# Patient Record
Sex: Male | Born: 1950 | Race: White | Hispanic: No | Marital: Married | Smoking: Never smoker
Health system: Southern US, Community
[De-identification: ages and names within clinical notes are randomized; demographics above are authoritative.]

## PROBLEM LIST (undated history)

## (undated) DIAGNOSIS — R519 Headache, unspecified: Secondary | ICD-10-CM

## (undated) DIAGNOSIS — C801 Malignant (primary) neoplasm, unspecified: Secondary | ICD-10-CM

## (undated) DIAGNOSIS — C61 Malignant neoplasm of prostate: Secondary | ICD-10-CM

## (undated) DIAGNOSIS — E119 Type 2 diabetes mellitus without complications: Secondary | ICD-10-CM

## (undated) DIAGNOSIS — G4730 Sleep apnea, unspecified: Secondary | ICD-10-CM

## (undated) DIAGNOSIS — C779 Secondary and unspecified malignant neoplasm of lymph node, unspecified: Secondary | ICD-10-CM

## (undated) DIAGNOSIS — H409 Unspecified glaucoma: Secondary | ICD-10-CM

## (undated) DIAGNOSIS — Z8546 Personal history of malignant neoplasm of prostate: Secondary | ICD-10-CM

## (undated) DIAGNOSIS — I10 Essential (primary) hypertension: Secondary | ICD-10-CM

## (undated) DIAGNOSIS — K219 Gastro-esophageal reflux disease without esophagitis: Secondary | ICD-10-CM

## (undated) DIAGNOSIS — C439 Malignant melanoma of skin, unspecified: Secondary | ICD-10-CM

## (undated) DIAGNOSIS — T8130XA Disruption of wound, unspecified, initial encounter: Secondary | ICD-10-CM

## (undated) HISTORY — DX: Unspecified glaucoma: H40.9

## (undated) HISTORY — PX: EYE SURGERY: SHX253

## (undated) HISTORY — DX: Personal history of malignant neoplasm of prostate: Z85.46

## (undated) HISTORY — PX: OTHER SURGICAL HISTORY: SHX169

## (undated) HISTORY — DX: Disruption of wound, unspecified, initial encounter: T81.30XA

## (undated) HISTORY — DX: Malignant neoplasm of prostate: C61

## (undated) HISTORY — DX: Secondary and unspecified malignant neoplasm of lymph node, unspecified: C77.9

---

## 1995-10-14 HISTORY — PX: BACK SURGERY: SHX140

## 1998-09-20 ENCOUNTER — Encounter: Payer: Self-pay | Admitting: Neurosurgery

## 1998-09-22 ENCOUNTER — Encounter: Payer: Self-pay | Admitting: Neurosurgery

## 1998-09-22 ENCOUNTER — Observation Stay (HOSPITAL_COMMUNITY): Admission: RE | Admit: 1998-09-22 | Discharge: 1998-09-23 | Payer: Self-pay | Admitting: Neurosurgery

## 2001-02-01 ENCOUNTER — Ambulatory Visit (HOSPITAL_COMMUNITY): Admission: RE | Admit: 2001-02-01 | Discharge: 2001-02-01 | Payer: Self-pay | Admitting: *Deleted

## 2003-01-23 ENCOUNTER — Ambulatory Visit (HOSPITAL_COMMUNITY): Admission: RE | Admit: 2003-01-23 | Discharge: 2003-01-23 | Payer: Self-pay | Admitting: Ophthalmology

## 2003-01-30 ENCOUNTER — Ambulatory Visit (HOSPITAL_COMMUNITY): Admission: RE | Admit: 2003-01-30 | Discharge: 2003-01-30 | Payer: Self-pay | Admitting: Ophthalmology

## 2010-10-13 HISTORY — PX: OTHER SURGICAL HISTORY: SHX169

## 2011-01-27 LAB — HM COLONOSCOPY

## 2012-03-11 ENCOUNTER — Other Ambulatory Visit: Payer: Self-pay | Admitting: Urology

## 2012-05-24 ENCOUNTER — Encounter (HOSPITAL_COMMUNITY): Payer: Self-pay | Admitting: Pharmacy Technician

## 2012-06-03 ENCOUNTER — Encounter (HOSPITAL_COMMUNITY)
Admission: RE | Admit: 2012-06-03 | Discharge: 2012-06-03 | Disposition: A | Discharge status: Home / Self Care | Payer: BC Managed Care – PPO | Source: Ambulatory Visit | Attending: Urology | Admitting: Urology

## 2012-06-03 ENCOUNTER — Encounter (HOSPITAL_COMMUNITY): Payer: Self-pay

## 2012-06-03 ENCOUNTER — Ambulatory Visit (HOSPITAL_COMMUNITY)
Admission: RE | Admit: 2012-06-03 | Discharge: 2012-06-03 | Disposition: A | Discharge status: Home / Self Care | Payer: BC Managed Care – PPO | Source: Ambulatory Visit | Attending: Urology | Admitting: Urology

## 2012-06-03 DIAGNOSIS — Z01818 Encounter for other preprocedural examination: Secondary | ICD-10-CM | POA: Insufficient documentation

## 2012-06-03 DIAGNOSIS — Z01812 Encounter for preprocedural laboratory examination: Secondary | ICD-10-CM | POA: Insufficient documentation

## 2012-06-03 HISTORY — DX: Malignant (primary) neoplasm, unspecified: C80.1

## 2012-06-03 HISTORY — DX: Sleep apnea, unspecified: G47.30

## 2012-06-03 HISTORY — DX: Essential (primary) hypertension: I10

## 2012-06-03 HISTORY — DX: Gastro-esophageal reflux disease without esophagitis: K21.9

## 2012-06-03 LAB — SURGICAL PCR SCREEN
MRSA, PCR: NEGATIVE
Staphylococcus aureus: NEGATIVE

## 2012-06-03 LAB — BASIC METABOLIC PANEL
BUN: 14 mg/dL (ref 6–23)
CO2: 29 mEq/L (ref 19–32)
Calcium: 9.5 mg/dL (ref 8.4–10.5)
Chloride: 103 mEq/L (ref 96–112)
Creatinine, Ser: 0.98 mg/dL (ref 0.50–1.35)
GFR calc Af Amer: >90 mL/min (ref >90)
GFR calc non Af Amer: 87 mL/min — ABNORMAL LOW (ref >90)
Glucose, Bld: 102 mg/dL — ABNORMAL HIGH (ref 70–99)
Potassium: 3.7 mEq/L (ref 3.5–5.1)
Sodium: 139 mEq/L (ref 135–145)

## 2012-06-03 LAB — CBC
HCT: 41.6 % (ref 39.0–52.0)
Hemoglobin: 15.2 g/dL (ref 13.0–17.0)
MCH: 31.1 pg (ref 26.0–34.0)
MCHC: 36.5 g/dL — ABNORMAL HIGH (ref 30.0–36.0)
MCV: 85.2 fL (ref 78.0–100.0)
Platelets: 262 10*3/uL (ref 150–400)
RBC: 4.88 MIL/uL (ref 4.22–5.81)
RDW: 12.7 % (ref 11.5–15.5)
WBC: 10.1 10*3/uL (ref 4.0–10.5)

## 2012-06-03 NOTE — Progress Notes (Signed)
06/03/12 1446  OBSTRUCTIVE SLEEP APNEA  Have you ever been diagnosed with sleep apnea through a sleep study? No  Do you snore loudly (loud enough to be heard through closed doors)?  1  Do you often feel tired, fatigued, or sleepy during the daytime? 1  Has anyone observed you stop breathing during your sleep? 1  Do you have, or are you being treated for high blood pressure? 1  BMI more than 35 kg/m2? 1  Age over 61 years old? 1  Neck circumference greater than 40 cm/18 inches? 0  Gender: 1  Obstructive Sleep Apnea Score 7   Score 4 or greater  Updated health history;Results sent to PCP

## 2012-06-03 NOTE — Patient Instructions (Addendum)
20 Mason Stewart  06/03/2012   Your procedure is scheduled on:06-07-2012    Report to [name] at  0830 AM.  Call this number if you have problems the morning of surgery: [phone]   Remember:   Do not eat food after midnight Saturday night :After Midnight.   Clear liquids all day Sunday 06-06-2012  Take these medicines the morning of surgery with A SIP OF WATER: omeprazole   Do not wear jewelry or make up.  Do not wear lotions, powders, or perfumes.Do not wear deodorant.    Do not bring valuables to the hospital.  Contacts, dentures or bridgework may not be worn into surgery.  Leave suitcase in the car. After surgery it may be brought to your room.  For patients admitted to the hospital, checkout time is 11:00 AM the day of discharge                             Patients discharged the day of surgery will not be allowed to drive home. If going home same day of surgery, you must have someone stay with you the first 24 hours at home and arrange for some one to drive you home from hospital.    Special Instructions: CHG Shower Use Special Wash: 1/2 bottle night before surgery and 1/2 bottle morning  of surgery, use regular soap on face and front and back private area. Women do not shave legs or underarms for 2 days before showers. Men may shave face morning of surgery.    Please read over the following fact sheets that you were given: MRSA Information, blood fact sheet, incentive spirometer fact sheet  [facility] pre op nurse phone number [phone], call if needed

## 2012-06-06 NOTE — H&P (Signed)
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 Retta Diones on 02/02/12 which demonstrated Gleason 3+4=7 adenocarcinoma is 4 out of 12 biopsy cores. He has no family history of prostate cancer.  He is self-employed he and works as a Advertising account planner. His other issue is that he has a disabled son who requires full care.  TNM stage: cT1c Nx Mx PSA: 4.51 Gleason score: 3+4=7 Biopsy (02/02/12): 4/12 cores -- R lateral apex (15%, 3+3=6), R mid (5%, 3+3=6, PNI), R lateral mid (10%, 3+4=7), R lateral base (<5%, 3+3=6) Prostate volume: 61 cc  Nomogram:  OC disease: 80% EPE: 14% SVI: 1% LNI: 2.2% PFS (surgery): 94%, 92%  Urinary function: He does have moderate voiding symptoms including a sense of incomplete emptying, frequency, and nocturia. IPSS is 14. Erectile function: He does have preserved erectile function. SHIM score is 24. However, sexual function is a relatively low priority considering his wife's interest in sexual activity which is low.   Interval history:  He follows up today in preparation for his upcoming surgery. He has elected to proceed with a robotic prostatectomy and pelvic lymphadenectomy for surgical treatment of his prostate cancer. He has remained in excellent health over the past few months and has not been hospitalized for new reason.     Past Medical History Problems  1. History of  Esophageal Reflux 530.81 2. History of  Glaucoma 365.9 3. History of  Hypertension 401.9  Surgical History Problems  1. History of  Back Surgery  Current Meds 1. Lisinopril-Hydrochlorothiazide 10-12.5 MG Oral Tablet; Therapy: 25Mar2013 to 2. Losartan Potassium-HCTZ 50-12.5 MG Oral Tablet; Therapy: 06May2013 to 3. Lumigan 0.01 % Ophthalmic Solution; Therapy: (Recorded:15Mar2013) to 4. Omeprazole 20 MG Oral Capsule Delayed Release; Therapy: (Recorded:15Mar2013) to 5.  Triamterene-HCTZ 37.5-25 MG Oral Capsule; Therapy: (Recorded:15Mar2013) to  Allergies Medication  1. No Known Drug Allergies  Family History Problems  1. Paternal history of  Cardiac Failure 2. Maternal history of  Heart Disease V17.49 3. Paternal history of  Malignant Mesothelioma Of The Lung V16.1  Social History Problems  1. Alcohol Use 1/week 2. Marital History - Currently Married 3. Never A Smoker  Vitals Vital Signs [Data Includes: Last 1 Day]  13Aug2013 10:28AM  BMI Calculated: 35.36 BSA Calculated: 2.09 Weight: 220 lb  Blood Pressure: 138 / 79 Heart Rate: 73  Physical Exam Constitutional: Well nourished and well developed . No acute distress.  ENT:. The ears and nose are normal in appearance.  Neck: The appearance of the neck is normal and no neck mass is present.  Pulmonary: No respiratory distress, normal respiratory rhythm and effort and clear bilateral breath sounds.  Cardiovascular: Heart rate and rhythm are normal . No peripheral edema.  Abdomen: The abdomen is soft and nontender.  Neuro/Psych:. Mood and affect are appropriate.    Results/Data Urine [Data Includes: Last 1 Day]   13Aug2013 COLOR STRAW  APPEARANCE CLEAR  SPECIFIC GRAVITY <1.005  pH 5.5  GLUCOSE NEG mg/dL BILIRUBIN NEG  KETONE NEG mg/dL BLOOD NEG  PROTEIN NEG mg/dL UROBILINOGEN 0.2 mg/dL NITRITE NEG  LEUKOCYTE ESTERASE NEG   Assessment Assessed  1. Prostate Cancer 185  Plan Health Maintenance (V70.0)  1. UA With REFLEX  Done: 13Aug2013 10:04AM Prostate Cancer (185)  2. Follow-up Schedule Surgery Office  Follow-up  Done: 13Aug2013  Discussion/Summary  1. Clinically localized prostate cancer: He will proceed as planned for  surgical treatment of his prostate cancer later this month. He has been scheduled for a bilateral nerve sparing prostatectomy and pelvic lymphadenectomy. All of his questions were answered to his stated satisfaction today.  Cc: Dr. Kirby Funk       Signatures Electronically signed by : Heloise Purpura, M.D.; May 25 2012 11:05AM

## 2012-06-07 ENCOUNTER — Inpatient Hospital Stay (HOSPITAL_COMMUNITY)
Admission: RE | Admit: 2012-06-07 | Discharge: 2012-06-08 | DRG: 335 | Disposition: A | Discharge status: Home / Self Care | Payer: BC Managed Care – PPO | Source: Ambulatory Visit | Attending: Urology | Admitting: Urology

## 2012-06-07 ENCOUNTER — Encounter (HOSPITAL_COMMUNITY): Payer: Self-pay | Admitting: *Deleted

## 2012-06-07 ENCOUNTER — Encounter (HOSPITAL_COMMUNITY): Payer: Self-pay | Admitting: Anesthesiology

## 2012-06-07 ENCOUNTER — Encounter (HOSPITAL_COMMUNITY)
Admission: RE | Disposition: A | Discharge status: Home / Self Care | Payer: Self-pay | Source: Ambulatory Visit | Attending: Urology

## 2012-06-07 ENCOUNTER — Ambulatory Visit (HOSPITAL_COMMUNITY): Payer: BC Managed Care – PPO | Admitting: Anesthesiology

## 2012-06-07 DIAGNOSIS — R339 Retention of urine, unspecified: Secondary | ICD-10-CM | POA: Diagnosis present

## 2012-06-07 DIAGNOSIS — R350 Frequency of micturition: Secondary | ICD-10-CM | POA: Diagnosis present

## 2012-06-07 DIAGNOSIS — I10 Essential (primary) hypertension: Secondary | ICD-10-CM | POA: Diagnosis present

## 2012-06-07 DIAGNOSIS — Z79899 Other long term (current) drug therapy: Secondary | ICD-10-CM

## 2012-06-07 DIAGNOSIS — R351 Nocturia: Secondary | ICD-10-CM | POA: Diagnosis present

## 2012-06-07 DIAGNOSIS — K219 Gastro-esophageal reflux disease without esophagitis: Secondary | ICD-10-CM | POA: Diagnosis present

## 2012-06-07 DIAGNOSIS — H409 Unspecified glaucoma: Secondary | ICD-10-CM | POA: Diagnosis present

## 2012-06-07 DIAGNOSIS — Z6836 Body mass index (BMI) 36.0-36.9, adult: Secondary | ICD-10-CM

## 2012-06-07 DIAGNOSIS — C61 Malignant neoplasm of prostate: Principal | ICD-10-CM | POA: Diagnosis present

## 2012-06-07 HISTORY — PX: ROBOT ASSISTED LAPAROSCOPIC RADICAL PROSTATECTOMY: SHX5141

## 2012-06-07 LAB — ABO/RH: ABO/RH(D): O POS

## 2012-06-07 LAB — TYPE AND SCREEN
ABO/RH(D): O POS
Antibody Screen: NEGATIVE

## 2012-06-07 LAB — HEMOGLOBIN AND HEMATOCRIT, BLOOD
HCT: 37 % — ABNORMAL LOW (ref 39.0–52.0)
Hemoglobin: 13.4 g/dL (ref 13.0–17.0)

## 2012-06-07 SURGERY — ROBOTIC ASSISTED LAPAROSCOPIC RADICAL PROSTATECTOMY LEVEL 2
Anesthesia: General | Wound class: Clean Contaminated

## 2012-06-07 MED ORDER — DOCUSATE SODIUM 100 MG PO CAPS
100.0000 mg | ORAL_CAPSULE | Freq: Two times a day (BID) | ORAL | Status: DC
  Administered 2012-06-07 – 2012-06-08 (×2): 100 mg via ORAL
  Filled 2012-06-07 (×4): qty 1

## 2012-06-07 MED ORDER — PANTOPRAZOLE SODIUM 40 MG PO TBEC
40.0000 mg | DELAYED_RELEASE_TABLET | Freq: Every day | ORAL | Status: DC
  Administered 2012-06-08: 40 mg via ORAL
  Filled 2012-06-07 (×2): qty 1

## 2012-06-07 MED ORDER — MEPERIDINE HCL 50 MG/ML IJ SOLN: 6.2500 mg | INTRAMUSCULAR | Status: DC | PRN

## 2012-06-07 MED ORDER — KCL IN DEXTROSE-NACL 20-5-0.45 MEQ/L-%-% IV SOLN
INTRAVENOUS | Status: DC
  Administered 2012-06-07 – 2012-06-08 (×3): via INTRAVENOUS
  Filled 2012-06-07 (×4): qty 1000

## 2012-06-07 MED ORDER — LIDOCAINE HCL (CARDIAC) 20 MG/ML IV SOLN
INTRAVENOUS | Status: DC | PRN
  Administered 2012-06-07: 100 mg via INTRAVENOUS

## 2012-06-07 MED ORDER — HYDROMORPHONE HCL PF 1 MG/ML IJ SOLN
INTRAMUSCULAR | Status: DC | PRN
  Administered 2012-06-07 (×2): .4 mg via INTRAVENOUS

## 2012-06-07 MED ORDER — EPHEDRINE SULFATE 50 MG/ML IJ SOLN
INTRAMUSCULAR | Status: DC | PRN
  Administered 2012-06-07: 10 mg via INTRAVENOUS

## 2012-06-07 MED ORDER — BIMATOPROST 0.01 % OP SOLN
1.0000 [drp] | Freq: Every day | OPHTHALMIC | Status: DC
  Filled 2012-06-07: qty 2.5

## 2012-06-07 MED ORDER — MIDAZOLAM HCL 5 MG/5ML IJ SOLN
INTRAMUSCULAR | Status: DC | PRN
  Administered 2012-06-07: 2 mg via INTRAVENOUS

## 2012-06-07 MED ORDER — NEOSTIGMINE METHYLSULFATE 1 MG/ML IJ SOLN
INTRAMUSCULAR | Status: DC | PRN
  Administered 2012-06-07: 5 mg via INTRAVENOUS

## 2012-06-07 MED ORDER — PROMETHAZINE HCL 25 MG/ML IJ SOLN: 6.2500 mg | INTRAMUSCULAR | Status: DC | PRN

## 2012-06-07 MED ORDER — HYDROMORPHONE HCL PF 1 MG/ML IJ SOLN
0.2500 mg | INTRAMUSCULAR | Status: DC | PRN
  Administered 2012-06-07: 0.25 mg via INTRAVENOUS

## 2012-06-07 MED ORDER — MORPHINE SULFATE 2 MG/ML IJ SOLN
2.0000 mg | INTRAMUSCULAR | Status: DC | PRN
  Administered 2012-06-07: 2 mg via INTRAVENOUS
  Filled 2012-06-07: qty 1

## 2012-06-07 MED ORDER — CISATRACURIUM BESYLATE (PF) 10 MG/5ML IV SOLN
INTRAVENOUS | Status: DC | PRN
  Administered 2012-06-07: 6 mg via INTRAVENOUS
  Administered 2012-06-07: 10 mg via INTRAVENOUS
  Administered 2012-06-07: 4 mg via INTRAVENOUS
  Administered 2012-06-07: 2 mg via INTRAVENOUS

## 2012-06-07 MED ORDER — SUCCINYLCHOLINE CHLORIDE 20 MG/ML IJ SOLN
INTRAMUSCULAR | Status: DC | PRN
  Administered 2012-06-07: 100 mg via INTRAVENOUS

## 2012-06-07 MED ORDER — INDIGOTINDISULFONATE SODIUM 8 MG/ML IJ SOLN
INTRAMUSCULAR | Status: DC | PRN
  Administered 2012-06-07 (×2): 5 mL via INTRAVENOUS

## 2012-06-07 MED ORDER — ACETAMINOPHEN 10 MG/ML IV SOLN
INTRAVENOUS | Status: AC
  Filled 2012-06-07: qty 100

## 2012-06-07 MED ORDER — BIMATOPROST 0.03 % OP SOLN
1.0000 [drp] | Freq: Every day | OPHTHALMIC | Status: DC
  Filled 2012-06-07: qty 2.5

## 2012-06-07 MED ORDER — HYDROCODONE-ACETAMINOPHEN 5-325 MG PO TABS: 1.0000 | ORAL_TABLET | Freq: Four times a day (QID) | ORAL | Status: AC | PRN

## 2012-06-07 MED ORDER — SODIUM CHLORIDE 0.9 % IV BOLUS (SEPSIS): 1000.0000 mL | Freq: Once | INTRAVENOUS | Status: DC

## 2012-06-07 MED ORDER — PROPOFOL 10 MG/ML IV EMUL
INTRAVENOUS | Status: DC | PRN
  Administered 2012-06-07: 200 mg via INTRAVENOUS
  Administered 2012-06-07: 100 mg via INTRAVENOUS

## 2012-06-07 MED ORDER — CEFAZOLIN SODIUM-DEXTROSE 2-3 GM-% IV SOLR
INTRAVENOUS | Status: AC
  Filled 2012-06-07: qty 50

## 2012-06-07 MED ORDER — CEFAZOLIN SODIUM-DEXTROSE 2-3 GM-% IV SOLR
2.0000 g | INTRAVENOUS | Status: AC
  Administered 2012-06-07: 2 g via INTRAVENOUS

## 2012-06-07 MED ORDER — STERILE WATER FOR IRRIGATION IR SOLN
Status: DC | PRN
  Administered 2012-06-07: 3000 mL

## 2012-06-07 MED ORDER — CEFAZOLIN SODIUM 1-5 GM-% IV SOLN
1.0000 g | Freq: Three times a day (TID) | INTRAVENOUS | Status: AC
  Administered 2012-06-07 – 2012-06-08 (×2): 1 g via INTRAVENOUS
  Filled 2012-06-07 (×2): qty 50

## 2012-06-07 MED ORDER — LACTATED RINGERS IV SOLN: INTRAVENOUS | Status: DC

## 2012-06-07 MED ORDER — SODIUM CHLORIDE 0.9 % IR SOLN
Status: DC | PRN
  Administered 2012-06-07: 300 mL via INTRAVESICAL

## 2012-06-07 MED ORDER — LACTATED RINGERS IV SOLN
INTRAVENOUS | Status: DC
  Administered 2012-06-07: 14:00:00 via INTRAVENOUS
  Administered 2012-06-07: 1000 mL via INTRAVENOUS

## 2012-06-07 MED ORDER — HYDROMORPHONE HCL PF 1 MG/ML IJ SOLN
INTRAMUSCULAR | Status: AC
  Filled 2012-06-07: qty 1

## 2012-06-07 MED ORDER — BUPIVACAINE-EPINEPHRINE PF 0.25-1:200000 % IJ SOLN
INTRAMUSCULAR | Status: AC
  Filled 2012-06-07: qty 30

## 2012-06-07 MED ORDER — MORPHINE SULFATE 2 MG/ML IJ SOLN
INTRAMUSCULAR | Status: AC
  Administered 2012-06-07: 2 mg via INTRAVENOUS
  Filled 2012-06-07: qty 1

## 2012-06-07 MED ORDER — DIPHENHYDRAMINE HCL 50 MG/ML IJ SOLN: 12.5000 mg | Freq: Four times a day (QID) | INTRAMUSCULAR | Status: DC | PRN

## 2012-06-07 MED ORDER — HEPARIN SODIUM (PORCINE) 5000 UNIT/ML IJ SOLN
INTRAMUSCULAR | Status: DC | PRN
  Administered 2012-06-07: 12:00:00

## 2012-06-07 MED ORDER — KETOROLAC TROMETHAMINE 15 MG/ML IJ SOLN
15.0000 mg | Freq: Four times a day (QID) | INTRAMUSCULAR | Status: DC
  Administered 2012-06-07 – 2012-06-08 (×5): 15 mg via INTRAVENOUS
  Filled 2012-06-07 (×5): qty 1

## 2012-06-07 MED ORDER — HEPARIN SODIUM (PORCINE) 1000 UNIT/ML IJ SOLN
INTRAMUSCULAR | Status: AC
  Filled 2012-06-07: qty 1

## 2012-06-07 MED ORDER — ONDANSETRON HCL 4 MG/2ML IJ SOLN
INTRAMUSCULAR | Status: DC | PRN
  Administered 2012-06-07: 4 mg via INTRAVENOUS

## 2012-06-07 MED ORDER — ACETAMINOPHEN 325 MG PO TABS: 650.0000 mg | ORAL_TABLET | ORAL | Status: DC | PRN

## 2012-06-07 MED ORDER — OMEPRAZOLE MAGNESIUM 20 MG PO TBEC: 20.0000 mg | DELAYED_RELEASE_TABLET | Freq: Every morning | ORAL | Status: DC

## 2012-06-07 MED ORDER — ACETAMINOPHEN 10 MG/ML IV SOLN
INTRAVENOUS | Status: DC | PRN
  Administered 2012-06-07: 1000 mg via INTRAVENOUS

## 2012-06-07 MED ORDER — DIPHENHYDRAMINE HCL 12.5 MG/5ML PO ELIX
12.5000 mg | ORAL_SOLUTION | Freq: Four times a day (QID) | ORAL | Status: DC | PRN
  Filled 2012-06-07: qty 10

## 2012-06-07 MED ORDER — KETOROLAC TROMETHAMINE 15 MG/ML IJ SOLN
INTRAMUSCULAR | Status: AC
  Filled 2012-06-07: qty 1

## 2012-06-07 MED ORDER — BUPIVACAINE-EPINEPHRINE 0.25% -1:200000 IJ SOLN
INTRAMUSCULAR | Status: DC | PRN
  Administered 2012-06-07: 27 mL

## 2012-06-07 MED ORDER — GLYCOPYRROLATE 0.2 MG/ML IJ SOLN
INTRAMUSCULAR | Status: DC | PRN
  Administered 2012-06-07: .8 mg via INTRAVENOUS

## 2012-06-07 MED ORDER — INDIGOTINDISULFONATE SODIUM 8 MG/ML IJ SOLN
INTRAMUSCULAR | Status: AC
  Filled 2012-06-07: qty 10

## 2012-06-07 MED ORDER — MORPHINE SULFATE 2 MG/ML IJ SOLN: 2.0000 mg | INTRAMUSCULAR | Status: DC | PRN

## 2012-06-07 MED ORDER — SUFENTANIL CITRATE 50 MCG/ML IV SOLN
INTRAVENOUS | Status: DC | PRN
  Administered 2012-06-07 (×2): 10 ug via INTRAVENOUS
  Administered 2012-06-07: 20 ug via INTRAVENOUS
  Administered 2012-06-07: 10 ug via INTRAVENOUS

## 2012-06-07 MED ORDER — CIPROFLOXACIN HCL 500 MG PO TABS: 500.0000 mg | ORAL_TABLET | Freq: Two times a day (BID) | ORAL | Status: AC

## 2012-06-07 SURGICAL SUPPLY — 36 items
CANISTER SUCTION 2500CC (MISCELLANEOUS) ×3 IMPLANT
CATH ROBINSON RED A/P 8FR (CATHETERS) ×3 IMPLANT
CHLORAPREP W/TINT 26ML (MISCELLANEOUS) ×3 IMPLANT
CLIP LIGATING HEM O LOK PURPLE (MISCELLANEOUS) ×6 IMPLANT
CLOTH BEACON ORANGE TIMEOUT ST (SAFETY) ×3 IMPLANT
CORD HIGH FREQUENCY UNIPOLAR (ELECTROSURGICAL) ×3 IMPLANT
COVER SURGICAL LIGHT HANDLE (MISCELLANEOUS) ×3 IMPLANT
COVER TIP SHEARS 8 DVNC (MISCELLANEOUS) ×2 IMPLANT
COVER TIP SHEARS 8MM DA VINCI (MISCELLANEOUS) ×1
CUTTER ECHEON FLEX ENDO 45 340 (ENDOMECHANICALS) ×3 IMPLANT
DECANTER SPIKE VIAL GLASS SM (MISCELLANEOUS) ×2 IMPLANT
DRAPE CAMERA HEAD DAVINCI SI (DRAPES) ×1 IMPLANT
DRAPE SURG IRRIG POUCH 19X23 (DRAPES) ×3 IMPLANT
DRSG TEGADERM 6X8 (GAUZE/BANDAGES/DRESSINGS) ×6 IMPLANT
ELECT REM PT RETURN 9FT ADLT (ELECTROSURGICAL) ×3
ELECTRODE REM PT RTRN 9FT ADLT (ELECTROSURGICAL) ×2 IMPLANT
GLOVE BIO SURGEON STRL SZ 6.5 (GLOVE) ×3 IMPLANT
GLOVE BIOGEL M STRL SZ7.5 (GLOVE) ×6 IMPLANT
GOWN STRL NON-REIN LRG LVL3 (GOWN DISPOSABLE) ×9 IMPLANT
GOWN STRL REIN XL XLG (GOWN DISPOSABLE) ×6 IMPLANT
HOLDER FOLEY CATH W/STRAP (MISCELLANEOUS) ×3 IMPLANT
IV LACTATED RINGERS 1000ML (IV SOLUTION) ×2 IMPLANT
KIT ACCESSORY DA VINCI DISP (KITS) ×1
KIT ACCESSORY DVNC DISP (KITS) ×2 IMPLANT
NDL SAFETY ECLIPSE 18X1.5 (NEEDLE) ×2 IMPLANT
NEEDLE HYPO 18GX1.5 SHARP (NEEDLE) ×3
PACK ROBOT UROLOGY CUSTOM (CUSTOM PROCEDURE TRAY) ×3 IMPLANT
RELOAD GREEN ECHELON 45 (STAPLE) ×3 IMPLANT
SET TUBE IRRIG SUCTION NO TIP (IRRIGATION / IRRIGATOR) ×3 IMPLANT
SOLUTION ELECTROLUBE (MISCELLANEOUS) ×3 IMPLANT
SPONGE GAUZE 4X4 12PLY (GAUZE/BANDAGES/DRESSINGS) ×2 IMPLANT
SUT VICRYL 0 UR6 27IN ABS (SUTURE) ×6 IMPLANT
SYR 27GX1/2 1ML LL SAFETY (SYRINGE) ×3 IMPLANT
TOWEL OR 17X26 10 PK STRL BLUE (TOWEL DISPOSABLE) ×3 IMPLANT
TOWEL OR NON WOVEN STRL DISP B (DISPOSABLE) ×3 IMPLANT
WATER STERILE IRR 1500ML POUR (IV SOLUTION) ×4 IMPLANT

## 2012-06-07 NOTE — Anesthesia Postprocedure Evaluation (Signed)
  Anesthesia Post-op Note  Patient: Mason Stewart  Procedure(s) Performed: Procedure(s) (LRB): ROBOTIC ASSISTED LAPAROSCOPIC RADICAL PROSTATECTOMY LEVEL 2 (N/A) LYMPHADENECTOMY (Bilateral)  Patient Location: PACU  Anesthesia Type: General  Level of Consciousness: awake and alert   Airway and Oxygen Therapy: Patient Spontanous Breathing  Post-op Pain: mild  Post-op Assessment: Post-op Vital signs reviewed, Patient's Cardiovascular Status Stable, Respiratory Function Stable, Patent Airway and No signs of Nausea or vomiting  Post-op Vital Signs: stable  Complications: No apparent anesthesia complications

## 2012-06-07 NOTE — Anesthesia Preprocedure Evaluation (Signed)
Anesthesia Evaluation  Patient identified by MRN, date of birth, ID band Patient awake    Reviewed: Allergy & Precautions, H&P , NPO status , Patient's Chart, lab work & pertinent test results  Airway Mallampati: II TM Distance: >3 FB Neck ROM: Full    Dental No notable dental hx.    Pulmonary neg pulmonary ROS,  breath sounds clear to auscultation  Pulmonary exam normal       Cardiovascular hypertension, Pt. on medications Rhythm:Regular Rate:Normal     Neuro/Psych negative neurological ROS  negative psych ROS   GI/Hepatic Neg liver ROS, GERD-  Medicated and Controlled,  Endo/Other  negative endocrine ROSMorbid obesity  Renal/GU negative Renal ROS  negative genitourinary   Musculoskeletal negative musculoskeletal ROS (+)   Abdominal   Peds negative pediatric ROS (+)  Hematology negative hematology ROS (+)   Anesthesia Other Findings Upper front perm bridge  Reproductive/Obstetrics negative OB ROS                           Anesthesia Physical Anesthesia Plan  ASA: II  Anesthesia Plan: General   Post-op Pain Management:    Induction: Intravenous  Airway Management Planned: Oral ETT  Additional Equipment:   Intra-op Plan:   Post-operative Plan: Extubation in OR  Informed Consent: I have reviewed the patients History and Physical, chart, labs and discussed the procedure including the risks, benefits and alternatives for the proposed anesthesia with the patient or authorized representative who has indicated his/her understanding and acceptance.   Dental advisory given  Plan Discussed with: CRNA  Anesthesia Plan Comments:         Anesthesia Quick Evaluation

## 2012-06-07 NOTE — Progress Notes (Signed)
Patient ID: PHILEMON RIEDESEL, male   DOB: 05/15/1951, 61 y.o.   MRN: [id number]  Post-op note  Subjective: The patient is doing well.  No complaints.  Objective: Vital signs in last 24 hours: Temp:  [97.1 F (36.2 C)-97.9 F (36.6 C)] 97.7 F (36.5 C) (08/26 1523) Pulse Rate:  [57-78] 78  (08/26 1523) Resp:  [8-20] 20  (08/26 1523) BP: (109-153)/(55-89) 146/84 mmHg (08/26 1523) SpO2:  [95 %-100 %] 96 % (08/26 1523) Weight:  [102.42 kg (225 lb 12.7 oz)] 102.42 kg (225 lb 12.7 oz) (08/26 1523)  Intake/Output from previous day:   Intake/Output this shift: Total I/O In: 2100 [I.V.:1100; IV Piggyback:1000] Out: 310 [Urine:85; Drains:75; Blood:150]  Physical Exam:  General: Alert and oriented. Abdomen: Soft, Nondistended. Incisions: Clean and dry.  Lab Results:  Basename 06/07/12 1432  HGB 13.4  HCT 37.0*    Assessment/Plan: POD#0   1) Continue to monitor   Moody Bruins. MD   LOS: 0 days   Suhail Peloquin,LES 06/07/2012, 6:19 PM

## 2012-06-07 NOTE — Preoperative (Signed)
Beta Blockers   Reason not to administer Beta Blockers:Not Applicable 

## 2012-06-07 NOTE — Interval H&P Note (Signed)
History and Physical Interval Note:  06/07/2012 10:43 AM  Mason Stewart  has presented today for surgery, with the diagnosis of PROSTATE CANCER  The various methods of treatment have been discussed with the patient and family. After consideration of risks, benefits and other options for treatment, the patient has consented to  Procedure(s) (LRB): ROBOTIC ASSISTED LAPAROSCOPIC RADICAL PROSTATECTOMY LEVEL 2 (N/A) LYMPHADENECTOMY (Bilateral) as a surgical intervention .  The patient's history has been reviewed, patient examined, no change in status, stable for surgery.  I have reviewed the patient's chart and labs.  Questions were answered to the patient's satisfaction.     [name],LES

## 2012-06-07 NOTE — Transfer of Care (Signed)
Immediate Anesthesia Transfer of Care Note  Patient: Mason Stewart  Procedure(s) Performed: Procedure(s) (LRB): ROBOTIC ASSISTED LAPAROSCOPIC RADICAL PROSTATECTOMY LEVEL 2 (N/A) LYMPHADENECTOMY (Bilateral)  Patient Location: PACU  Anesthesia Type: General  Level of Consciousness: awake, alert  and oriented  Airway & Oxygen Therapy: Patient Spontanous Breathing and Patient connected to face mask oxygen  Post-op Assessment: Report given to PACU RN and Post -op Vital signs reviewed and stable  Post vital signs: Reviewed and stable  Complications: No apparent anesthesia complications

## 2012-06-07 NOTE — Op Note (Signed)
Preoperative diagnosis: Clinically localized adenocarcinoma of the prostate (clinical stage cT1c Nx Mx)  Postoperative diagnosis: Clinically localized adenocarcinoma of the prostate (clinical stage cT1c Nx Mx)  Procedure:  1. Robotic assisted laparoscopic radical prostatectomy (bilateral nerve sparing) 2. Bilateral robotic assisted laparoscopic pelvic lymphadenectomy  Surgeon: Moody Bruins. M.D.  Assistant: Pecola Leisure, PA-C  Anesthesia: General  Complications: None  EBL: 150 mL  IVF:  1300 mL crystalloid  Specimens: 1. Prostate and seminal vesicles 2. Right pelvic lymph nodes 3. Left pelvic lymph nodes  Disposition of specimens: Pathology  Drains: 1. 20 Fr coude catheter 2. # 19 Blake pelvic drain  Indication: Mason Stewart is a 61 y.o. year old patient with clinically localized prostate cancer.  After a thorough review of the management options for treatment of prostate cancer, he elected to proceed with surgical therapy and the above procedure(s).  We have discussed the potential benefits and risks of the procedure, side effects of the proposed treatment, the likelihood of the patient achieving the goals of the procedure, and any potential problems that might occur during the procedure or recuperation. Informed consent has been obtained.  Description of procedure:  The patient was taken to the operating room and a general anesthetic was administered. He was given preoperative antibiotics, placed in the dorsal lithotomy position, and prepped and draped in the usual sterile fashion. Next a preoperative timeout was performed. A urethral catheter was placed into the bladder and a site was selected near the umbilicus for placement of the camera port. This was placed using a standard open Hassan technique which allowed entry into the peritoneal cavity under direct vision and without difficulty. A 12 mm port was placed and a pneumoperitoneum established. The camera was  then used to inspect the abdomen and there was no evidence of any intra-abdominal injuries or other abnormalities. The remaining abdominal ports were then placed. 8 mm robotic ports were placed in the right lower quadrant, left lower quadrant, and far left lateral abdominal wall. A 5 mm port was placed in the right upper quadrant and a 12 mm port was placed in the right lateral abdominal wall for laparoscopic assistance. All ports were placed under direct vision without difficulty. The surgical cart was then docked.   Utilizing the cautery scissors, the bladder was reflected posteriorly allowing entry into the space of Retzius and identification of the endopelvic fascia and prostate. The periprostatic fat was then removed from the prostate allowing full exposure of the endopelvic fascia. The endopelvic fascia was then incised from the apex back to the base of the prostate bilaterally and the underlying levator muscle fibers were swept laterally off the prostate thereby isolating the dorsal venous complex. The dorsal vein was then stapled and divided with a 45 mm Flex Echelon stapler. Attention then turned to the bladder neck which was divided anteriorly thereby allowing entry into the bladder and exposure of the urethral catheter. The catheter balloon was deflated and the catheter was brought into the operative field and used to retract the prostate anteriorly. The posterior bladder neck was then examined and was divided allowing further dissection between the bladder and prostate posteriorly until the vasa deferentia and seminal vessels were identified. The vasa deferentia were isolated, divided, and lifted anteriorly. The seminal vesicles were dissected down to their tips with care to control the seminal vascular arterial blood supply. These structures were then lifted anteriorly and the space between Denonvillier's fascia and the anterior rectum was developed with a combination  of sharp and blunt dissection.  This isolated the vascular pedicles of the prostate.  The lateral prostatic fascia was then sharply incised allowing release of the neurovascular bundles bilaterally. The vascular pedicles of the prostate were then ligated with Weck clips between the prostate and neurovascular bundles and divided with sharp cold scissor dissection resulting in neurovascular bundle preservation. The neurovascular bundles were then separated off the apex of the prostate and urethra bilaterally.  The urethra was then sharply transected allowing the prostate specimen to be disarticulated. The pelvis was copiously irrigated and hemostasis was ensured. There was no evidence for rectal injury.  Attention then turned to the right pelvic sidewall. The fibrofatty tissue between the external iliac vein, confluence of the iliac vessels, hypogastric artery, and Cooper's ligament was dissected free from the pelvic sidewall with care to preserve the obturator nerve. Weck clips were used for lymphostasis and hemostasis. An identical procedure was performed on the contralateral side and the lymphatic packets were removed for permanent pathologic analysis.  Attention then turned to the urethral anastomosis. There was some bleeding from the neurovascular bundles which was controlled with figure of 8 3-0 vicryl sutures bilaterally. A 2-0 Vicryl slip knot was placed between Denonvillier's fascia, the posterior bladder neck, and the posterior urethra to reapproximate these structures. A double-armed 3-0 Monocryl suture was then used to perform a 360 running tension-free anastomosis between the bladder neck and urethra. A new urethral catheter was then placed into the bladder and irrigated. There were no blood clots within the bladder and the anastomosis appeared to be watertight. A #19 Blake drain was then brought through the left lateral 8 mm port site and positioned appropriately within the pelvis. It was secured to the skin with a nylon  suture. The surgical cart was then undocked. The right lateral 12 mm port site was closed at the fascial level with a 0 Vicryl suture placed laparoscopically. All remaining ports were then removed under direct vision. The prostate specimen was removed intact within the Endopouch retrieval bag via the periumbilical camera port site. This fascial opening was closed with two running 0 Vicryl sutures. 0.25% Marcaine was then injected into all port sites and all incisions were reapproximated at the skin level with staples. Sterile dressings were applied. The patient appeared to tolerate the procedure well and without complications. The patient was able to be extubated and transferred to the recovery unit in satisfactory condition.   Moody Bruins MD

## 2012-06-08 ENCOUNTER — Encounter (HOSPITAL_COMMUNITY): Payer: Self-pay | Admitting: Urology

## 2012-06-08 LAB — HEMOGLOBIN AND HEMATOCRIT, BLOOD
HCT: 35.2 % — ABNORMAL LOW (ref 39.0–52.0)
Hemoglobin: 12.6 g/dL — ABNORMAL LOW (ref 13.0–17.0)

## 2012-06-08 MED ORDER — BISACODYL 10 MG RE SUPP
10.0000 mg | Freq: Once | RECTAL | Status: AC
  Administered 2012-06-08: 10 mg via RECTAL
  Filled 2012-06-08: qty 1

## 2012-06-08 MED ORDER — HYDROCODONE-ACETAMINOPHEN 5-325 MG PO TABS: 1.0000 | ORAL_TABLET | Freq: Four times a day (QID) | ORAL | Status: DC | PRN

## 2012-06-08 NOTE — Discharge Summary (Signed)
  Date of admission: 06/07/2012  Date of discharge: 06/08/2012  Admission diagnosis: Prostate Cancer  Discharge diagnosis: Prostate Cancer  History and Physical: For full details, please see admission history and physical. Briefly, Mason Stewart is a 61 y.o. gentleman with localized prostate cancer.  After discussing management/treatment options, he elected to proceed with surgical treatment.  Hospital Course: Mason Stewart was taken to the operating room on 06/07/2012 and underwent a robotic assisted laparoscopic radical prostatectomy. He tolerated this procedure well and without complications. Postoperatively, he was able to be transferred to a regular hospital room following recovery from anesthesia.  He was able to begin ambulating the night of surgery. He remained hemodynamically stable overnight.  He had excellent urine output with appropriately minimal output from his pelvic drain and his pelvic drain was removed on POD #1.  He was transitioned to oral pain medication, tolerated a clear liquid diet, and had met all discharge criteria and was able to be discharged home later on POD#1.  Laboratory values:  Basename 06/08/12 0424 06/07/12 1432  HGB 12.6* 13.4  HCT 35.2* 37.0*    Disposition: Home  Discharge instruction: He was instructed to be ambulatory but to refrain from heavy lifting, strenuous activity, or driving. He was instructed on urethral catheter care.  Discharge medications:   Medication List  As of 06/08/2012  1:15 PM   START taking these medications         ciprofloxacin 500 MG tablet   Commonly known as: CIPRO   Take 1 tablet (500 mg total) by mouth 2 (two) times daily. Start day prior to office visit for foley removal      HYDROcodone-acetaminophen 5-325 MG per tablet   Commonly known as: NORCO/VICODIN   Take 1-2 tablets by mouth every 6 (six) hours as needed for pain.         CONTINUE taking these medications         bimatoprost 0.03 % ophthalmic solution    Commonly known as: LUMIGAN      losartan-hydrochlorothiazide 50-12.5 MG per tablet   Commonly known as: HYZAAR      omeprazole 20 MG tablet   Commonly known as: PRILOSEC OTC          Where to get your medications    These are the prescriptions that you need to pick up.   You may get these medications from any pharmacy.         ciprofloxacin 500 MG tablet   HYDROcodone-acetaminophen 5-325 MG per tablet            Followup: He will followup in 1 week for catheter removal and to discuss his surgical pathology results.

## 2012-06-08 NOTE — Progress Notes (Signed)
Patient ID: Mason Stewart, male   DOB: 01/07/1951, 61 y.o.   MRN: [id number]  1 Day Post-Op Subjective: The patient is doing well.  No nausea or vomiting. Pain is adequately controlled.  Objective: Vital signs in last 24 hours: Temp:  [97.1 F (36.2 C)-99.2 F (37.3 C)] 99.2 F (37.3 C) (08/27 0501) Pulse Rate:  [57-90] 90  (08/27 0501) Resp:  [8-20] 20  (08/27 0501) BP: (109-153)/(55-89) 138/77 mmHg (08/27 0501) SpO2:  [95 %-100 %] 96 % (08/27 0501) Weight:  [102.42 kg (225 lb 12.7 oz)] 102.42 kg (225 lb 12.7 oz) (08/26 1523)  Intake/Output from previous day: 08/26 0701 - 08/27 0700 In: 3387.5 [P.O.:200; I.V.:2137.5; IV Piggyback:1050] Out: 2330 [Urine:2060; Drains:120; Blood:150] Intake/Output this shift:    Physical Exam:  General: Alert and oriented. CV: RRR Lungs: Clear bilaterally. GI: Soft, Nondistended. Incisions: Dressings intact. Urine: Clear Extremities: Nontender, no erythema, no edema.  Lab Results:  Basename 06/08/12 0424 06/07/12 1432  HGB 12.6* 13.4  HCT 35.2* 37.0*      Assessment/Plan: POD# 1 s/p robotic prostatectomy.  1) SL IVF 2) Ambulate, Incentive spirometry 3) Transition to oral pain medication 4) Dulcolax suppository 5) D/C pelvic drain 6) Plan for likely discharge later today   Moody Bruins. MD   LOS: 1 day   Lakisa Lotz,LES 06/08/2012, 7:31 AM

## 2013-10-20 ENCOUNTER — Other Ambulatory Visit: Payer: Self-pay | Admitting: Dermatology

## 2013-11-04 ENCOUNTER — Encounter (INDEPENDENT_AMBULATORY_CARE_PROVIDER_SITE_OTHER): Payer: Self-pay

## 2013-11-04 ENCOUNTER — Ambulatory Visit (INDEPENDENT_AMBULATORY_CARE_PROVIDER_SITE_OTHER): Payer: BC Managed Care – PPO | Admitting: Surgery

## 2013-11-04 ENCOUNTER — Encounter (INDEPENDENT_AMBULATORY_CARE_PROVIDER_SITE_OTHER): Payer: Self-pay | Admitting: Surgery

## 2013-11-04 ENCOUNTER — Telehealth (INDEPENDENT_AMBULATORY_CARE_PROVIDER_SITE_OTHER): Payer: Self-pay | Admitting: *Deleted

## 2013-11-04 VITALS — BP 140/90 | HR 72 | Temp 98.1°F | Resp 14 | Ht 66.0 in | Wt 231.8 lb

## 2013-11-04 DIAGNOSIS — C439 Malignant melanoma of skin, unspecified: Secondary | ICD-10-CM

## 2013-11-04 NOTE — Progress Notes (Signed)
Patient ID: Mason Stewart, male   DOB: 07/09/1951, 63 y.o.   MRN: [id number]  Chief Complaint  Patient presents with  . New Evaluation    eval Rt upper chest malignant melanoma    HPI Mason Stewart is a 63 y.o. male.   HPIpatient sent a request of  [name], MD For right chest melanoma.  See path report in  Note for details.  Patient had skin lesion there for 2 years. He begin to change. No ulceration. It was pigmented. Past Medical History  Diagnosis Date  . Hypertension   . Cancer     prostate cancer  . GERD (gastroesophageal reflux disease)   . Sleep apnea     stopbang =7  . Glaucoma (increased eye pressure)     Past Surgical History  Procedure Laterality Date  . Back surgery  1997    lower back  . Left middle finger surgery after injury  yrs ago  . Colonscopy  2012  . Robot assisted laparoscopic radical prostatectomy  06/07/2012    Procedure: ROBOTIC ASSISTED LAPAROSCOPIC RADICAL PROSTATECTOMY LEVEL 2;  Surgeon: Les Borden, MD;  Location: [facility];  Service: Urology;  Laterality: N/A;        Family History  Problem Relation Age of Onset  . Heart disease Mother   . Cancer Father     mesthelioma    Social History History  Substance Use Topics  . Smoking status: Never Smoker   . Smokeless tobacco: Never Used  . Alcohol Use: Yes     Comment: occasional    No Known Allergies  Current Outpatient Prescriptions  Medication Sig Dispense Refill  . bimatoprost (LUMIGAN) 0.03 % ophthalmic solution Place 1 drop into both eyes at bedtime.      . losartan-hydrochlorothiazide (HYZAAR) 50-12.5 MG per tablet Take 1 tablet by mouth daily before breakfast.      . omeprazole (PRILOSEC OTC) 20 MG tablet Take 20 mg by mouth every morning.       . omeprazole (PRILOSEC) 20 MG capsule Take 20 mg by mouth daily.       No current facility-administered medications for this visit.    Review of Systems Review of Systems  Constitutional: Negative for fever, chills  and unexpected weight change.  HENT: Negative for congestion, hearing loss, sore throat, trouble swallowing and voice change.   Eyes: Negative for visual disturbance.  Respiratory: Negative for cough and wheezing.   Cardiovascular: Negative for chest pain, palpitations and leg swelling.  Gastrointestinal: Negative for nausea, vomiting, abdominal pain, diarrhea, constipation, blood in stool, abdominal distention, anal bleeding and rectal pain.  Genitourinary: Negative for hematuria and difficulty urinating.  Musculoskeletal: Negative for arthralgias.  Skin: Negative for rash and wound.  Neurological: Negative for seizures, syncope, weakness and headaches.  Hematological: Negative for adenopathy. Does not bruise/bleed easily.  Psychiatric/Behavioral: Negative for confusion.    Blood pressure 140/90, pulse 72, temperature 98.1 F (36.7 C), temperature source Temporal, resp. rate 14, height 5' 6" (1.676 m), weight 231 lb 12.8 oz (105.144 kg).  Physical Exam Physical Exam  Constitutional: He is oriented to person, place, and time. He appears well-developed and well-nourished.  Eyes: Pupils are equal, round, and reactive to light. No scleral icterus.  Neck: Normal range of motion. Neck supple.  Cardiovascular: Normal rate and regular rhythm.   Pulmonary/Chest: Effort normal and breath sounds normal.    Musculoskeletal: Normal range of motion.  Lymphadenopathy:    He has no cervical adenopathy.      He has no axillary adenopathy.  Neurological: He is alert and oriented to person, place, and time.  Skin: Skin is warm and dry.  Psychiatric: He has a normal mood and affect. His behavior is normal. Judgment and thought content normal.    Data Reviewed Superficial spreading melanoma 1.82 mm thickness Clark level IV positive for depression risk lymphocytic infiltration no ulceration right chest   Assessment    Stage II melanoma    Plan    Recommend wide excision 1 cm margin and right  axillary sentinel lymph node mapping. Obtain preoperative lymphoscintigraphy prior to surgery.Sentinel lymph node mapping and dissection has been discussed with the patient.  Risk of bleeding,  Infection,  Seroma formation,  Additional procedures,,  Shoulder weakness ,  Shoulder stiffness,  Nerve and blood vessel injury and reaction to the mapping dyes have been discussed.  Alternatives to surgery have been discussed with the patient.  The patient agrees to proceed.The procedure has been discussed with the patient.  Alternative therapies have been discussed with the patient.  Operative risks include bleeding,  Infection,  Organ injury,  Nerve injury,  Blood vessel injury,  DVT,  Pulmonary embolism,  Death,  And possible reoperation.  Medical management risks include worsening of present situation.  The success of the procedure is 50 -90 % at treating patients symptoms.  The patient understands and agrees to proceed.       [name] A. 11/04/2013, 12:32 PM

## 2013-11-04 NOTE — Telephone Encounter (Signed)
I spoke with pt and informed him of his appt for the lymphoscintigraphy at [facility] on 11/10/13 with an arrival time of 10:45am.  He is agreeable with this appt.

## 2013-11-04 NOTE — Patient Instructions (Signed)
Sentinel Lymph Node Biopsy Sentinel lymph node biopsy is a procedure in which a single lymph node is identified, removed, and examined for cancer. Lymph nodes are collections of tissue that help filter infections, cancer cells, and other waste substances from the bloodstream. Certain types of cancer can spread to nearby lymph nodes. The cancer spreads to one lymph node first, and then to others. The first lymph node that your cancer could spread to is called the sentinel lymph node. Examining the sentinel lymph node for cancer can help your caregiver plan future treatment for you. LET YOUR CAREGIVER KNOW ABOUT:   Allergies to food or medicine.  Medicines taken, including vitamins, herbs, eyedrops, over-the-counter medicines, and creams.  Use of steroids (by mouth or creams).  Previous problems with numbing medicines.  History of bleeding problems or blood clots.  Previous surgery.  Other health problems, including diabetes and kidney problems.  Possibility of pregnancy, if this applies. RISKS AND COMPLICATIONS   Infection.  Bleeding.  Allergic reaction to the dye used for the procedure.  Blue staining of the skin where the dye is injected.  Damaged lymph vessels, causing a buildup of fluid (lymphedema).  Pain or bruising at the biopsy site. BEFORE THE PROCEDURE   Stop smoking at least 2 weeks before the procedure. Not smoking will improve your health after the procedure and decrease the chance of getting a wound infection.  You may have blood tests to make sure your blood clots normally.  Ask your caregiver about changing or stopping your regular medicines.  Do not eat or drink anything for 8 hours before the procedure. PROCEDURE   You will be given medicine that makes you sleep (general anesthetic).  A blue, radioactive dye will be injected near the tumor. The dye will then spread into the sentinel lymph node.  A scanner will identify the sentinel lymph node.  A  small cut (incision) will be made, and the sentinel lymph node will be removed.  The sentinel lymph node will be examined in a lab. Sometimes, a sentinel lymph node biopsy is performed during another surgery, such as a mastectomy or lumpectomy for breast cancer.  AFTER THE PROCEDURE   You will go to a recovery room.  You will be monitored for several hours.  If complications do not occur, you will be allowed to go home a few hours after the procedure.  Your urine may be blue for the next 24 hours. This is normal. It is caused by the dye used during the procedure.  Your skin where the dye was injected may be blue for up to 8 weeks. Document Released: 12/22/2011 Document Reviewed: 12/22/2011 [facility] Patient Information 2014 [place]. Melanoma Melanoma is the least common, but most dangerous, form of skin cancer. This is because it can spread (metastasize) to other organs and can be life-threatening. Melanoma is a cancerous (malignant) tumor that begins in a certain type of cells, called melanocytes. Melanocytes are the cells that produce the color (pigment) called melanin. Melanin colors our skin, hair, eyes, and moles. CAUSES  The exact cause of melanoma is unknown. You may have a higher risk if you:  Spend or have spent a lot of time in the sun. This includes sunlamp and tanning booth exposure.  Have had sunburns. This put you at a particularly increased risk for melanoma. The more blistering sunburns a person has, the higher the risk.  Spend time in parts of the world with more intense sunlight.  Have fair skin  that does not tan easily. You may have a lower risk if you have a darker skin color. However, people with darker skin can get melanoma, especially on the hands and feet (acral areas).  Have a close relative (parent, sibling) who has melanoma.  Have a large number of skin moles (more than 100). SYMPTOMS  A skin mole is suspicious if it has any of these 5 traits. This  is called the ABCDE's of melanoma:  Asymmetry: Irregular shape, not simply round or oval.  Border: Edge of the mole is irregular, not smooth.  Color: Mole may have multiple colors in it, including brown, black, blue, red, or tan.  Diameter: More than 0.2 inches (6 mm) across.  Evolving: Any unusual change or symptoms in the mole, such as pain, itching, stinging, sensitivity, or bleeding. A mole that is noticeably changing in appearance, or any new mole, should be checked for melanoma. In general, people develop new moles until age 72. New moles after this age should be brought to the attention of your caregiver. DIAGNOSIS  Your caregiver can look at your skin and find lesions or moles that may be suspicious. A patient may also notice a mole with symptoms or a mole that does not look like most of the other moles on his or her body. This is called the "ugly duckling" sign. A tissue sample (biopsy) examined under a microscope is needed to determine if it is melanoma. The size and extent of the biopsy will depend on the location, size, and appearance of the skin lesion or mole. The biopsy can also reveal whether melanoma has spread to deeper layers of the skin. TREATMENT  Surgery to completely remove the melanoma is required. Lymph nodes may also be removed. If the melanoma has spread to other organs, such as the liver, lungs, bone, or brain, cancer-fighting drugs (chemotherapy) must be used. Your caregiver will discuss your treatment options with you. You can ask about being included in a clinical trial to evaluate new forms of treatment. Melanoma can occasionally recur years after the initial diagnosis. If you have melanoma, you will need follow-up visits with your caregiver for many years. PREVENTION  Risk for melanoma can be reduced by minimizing sun exposure. Practice the 3 S's:  Slip on a shirt.  Slop on sunscreen.  Slap on a hat. Do not spend time in the sun during peak midafternoon hours.  Sunscreen/sunblock with SPF 30 or higher and UVA/UVB block should be applied regularly. You should do this even during brief exposure to sunlight. You should also do this on cloudy days and in winter, even though the perceived sunlight is less. Always avoid sunburn! Wear sunglasses that block UV light. Be sure to see your caregiver if you have any new or changing moles. HOME CARE INSTRUCTIONS   Follow wound care instructions after surgical removal of your melanoma.  Practice good sun avoidance and protective measures as described above.  Let your close family members (parents, children, siblings) know about your diagnosis. This puts them at a higher risk of getting melanoma than the general population. SEEK MEDICAL CARE IF:   You notice any new moles, or you have any moles that are changing.  You have had a melanoma removed and you notice a new growth near the same location.  You have had a melanoma removed and you experience any new or unexplained health problems. Document Released: 09/29/2005 Document Revised: 12/22/2011 Document Reviewed: 01/18/2010 [facility] Patient Information 2014 [place], [place].

## 2013-11-10 ENCOUNTER — Encounter (HOSPITAL_COMMUNITY)
Admission: RE | Admit: 2013-11-10 | Discharge: 2013-11-10 | Disposition: A | Discharge status: Home / Self Care | Payer: BC Managed Care – PPO | Source: Ambulatory Visit | Attending: Surgery | Admitting: Surgery

## 2013-11-10 DIAGNOSIS — C439 Malignant melanoma of skin, unspecified: Secondary | ICD-10-CM | POA: Insufficient documentation

## 2013-11-10 MED ORDER — TECHNETIUM TC 99M SULFUR COLLOID FILTERED
0.5000 | Freq: Once | INTRAVENOUS | Status: AC | PRN
  Administered 2013-11-10: 0.5 via INTRADERMAL

## 2013-11-16 ENCOUNTER — Encounter (HOSPITAL_BASED_OUTPATIENT_CLINIC_OR_DEPARTMENT_OTHER): Payer: Self-pay | Admitting: *Deleted

## 2013-11-16 NOTE — Progress Notes (Signed)
To come in for [facility] labs and ekg-pt has been scored above 4 for sleep apnea-probably has it-has not had a study

## 2013-11-17 ENCOUNTER — Encounter (HOSPITAL_BASED_OUTPATIENT_CLINIC_OR_DEPARTMENT_OTHER)
Admission: RE | Admit: 2013-11-17 | Discharge: 2013-11-17 | Disposition: A | Discharge status: Home / Self Care | Payer: BC Managed Care – PPO | Source: Ambulatory Visit | Attending: Surgery | Admitting: Surgery

## 2013-11-17 DIAGNOSIS — Z01810 Encounter for preprocedural cardiovascular examination: Secondary | ICD-10-CM | POA: Insufficient documentation

## 2013-11-17 DIAGNOSIS — Z01812 Encounter for preprocedural laboratory examination: Secondary | ICD-10-CM | POA: Insufficient documentation

## 2013-11-17 LAB — CBC WITH DIFFERENTIAL/PLATELET
Basophils Absolute: 0 10*3/uL (ref 0.0–0.1)
Basophils Relative: 0 % (ref 0–1)
Eosinophils Absolute: 0.3 10*3/uL (ref 0.0–0.7)
Eosinophils Relative: 3 % (ref 0–5)
HCT: 42.3 % (ref 39.0–52.0)
Hemoglobin: 15.6 g/dL (ref 13.0–17.0)
Lymphocytes Relative: 20 % (ref 12–46)
Lymphs Abs: 2 10*3/uL (ref 0.7–4.0)
MCH: 31.6 pg (ref 26.0–34.0)
MCHC: 36.9 g/dL — ABNORMAL HIGH (ref 30.0–36.0)
MCV: 85.6 fL (ref 78.0–100.0)
Monocytes Absolute: 0.8 10*3/uL (ref 0.1–1.0)
Monocytes Relative: 8 % (ref 3–12)
Neutro Abs: 6.9 10*3/uL (ref 1.7–7.7)
Neutrophils Relative %: 69 % (ref 43–77)
Platelets: 264 10*3/uL (ref 150–400)
RBC: 4.94 MIL/uL (ref 4.22–5.81)
RDW: 12.8 % (ref 11.5–15.5)
WBC: 10 10*3/uL (ref 4.0–10.5)

## 2013-11-17 LAB — COMPREHENSIVE METABOLIC PANEL
ALT: 31 U/L (ref 0–53)
AST: 22 U/L (ref 0–37)
Albumin: 3.9 g/dL (ref 3.5–5.2)
Alkaline Phosphatase: 73 U/L (ref 39–117)
BUN: 15 mg/dL (ref 6–23)
CO2: 23 mEq/L (ref 19–32)
Calcium: 8.7 mg/dL (ref 8.4–10.5)
Chloride: 103 mEq/L (ref 96–112)
Creatinine, Ser: 0.94 mg/dL (ref 0.50–1.35)
GFR calc Af Amer: >90 mL/min (ref >90)
GFR calc non Af Amer: 88 mL/min — ABNORMAL LOW (ref >90)
Glucose, Bld: 156 mg/dL — ABNORMAL HIGH (ref 70–99)
Potassium: 4 mEq/L (ref 3.7–5.3)
Sodium: 141 mEq/L (ref 137–147)
Total Bilirubin: 0.5 mg/dL (ref 0.3–1.2)
Total Protein: 6.9 g/dL (ref 6.0–8.3)

## 2013-11-17 NOTE — Progress Notes (Signed)
EKG reviewed by Dr Fitzgerald. OK for surgery  

## 2013-11-23 ENCOUNTER — Ambulatory Visit (HOSPITAL_BASED_OUTPATIENT_CLINIC_OR_DEPARTMENT_OTHER): Payer: BC Managed Care – PPO | Admitting: Anesthesiology

## 2013-11-23 ENCOUNTER — Encounter (HOSPITAL_BASED_OUTPATIENT_CLINIC_OR_DEPARTMENT_OTHER)
Admission: RE | Disposition: A | Discharge status: Home / Self Care | Payer: Self-pay | Source: Ambulatory Visit | Attending: Surgery

## 2013-11-23 ENCOUNTER — Ambulatory Visit (HOSPITAL_BASED_OUTPATIENT_CLINIC_OR_DEPARTMENT_OTHER)
Admission: RE | Admit: 2013-11-23 | Discharge: 2013-11-23 | Disposition: A | Discharge status: Home / Self Care | Payer: BC Managed Care – PPO | Source: Ambulatory Visit | Attending: Surgery | Admitting: Surgery

## 2013-11-23 ENCOUNTER — Encounter (HOSPITAL_COMMUNITY)
Admission: RE | Admit: 2013-11-23 | Discharge: 2013-11-23 | Disposition: A | Discharge status: Home / Self Care | Payer: BC Managed Care – PPO | Source: Ambulatory Visit | Attending: Surgery | Admitting: Surgery

## 2013-11-23 ENCOUNTER — Encounter (HOSPITAL_BASED_OUTPATIENT_CLINIC_OR_DEPARTMENT_OTHER): Payer: BC Managed Care – PPO | Admitting: Anesthesiology

## 2013-11-23 ENCOUNTER — Encounter (HOSPITAL_BASED_OUTPATIENT_CLINIC_OR_DEPARTMENT_OTHER): Payer: Self-pay | Admitting: *Deleted

## 2013-11-23 DIAGNOSIS — Z9889 Other specified postprocedural states: Secondary | ICD-10-CM

## 2013-11-23 DIAGNOSIS — C4359 Malignant melanoma of other part of trunk: Secondary | ICD-10-CM

## 2013-11-23 DIAGNOSIS — I10 Essential (primary) hypertension: Secondary | ICD-10-CM | POA: Insufficient documentation

## 2013-11-23 DIAGNOSIS — Z8546 Personal history of malignant neoplasm of prostate: Secondary | ICD-10-CM | POA: Insufficient documentation

## 2013-11-23 DIAGNOSIS — C773 Secondary and unspecified malignant neoplasm of axilla and upper limb lymph nodes: Secondary | ICD-10-CM | POA: Insufficient documentation

## 2013-11-23 DIAGNOSIS — G4730 Sleep apnea, unspecified: Secondary | ICD-10-CM | POA: Insufficient documentation

## 2013-11-23 DIAGNOSIS — H409 Unspecified glaucoma: Secondary | ICD-10-CM | POA: Insufficient documentation

## 2013-11-23 DIAGNOSIS — C439 Malignant melanoma of skin, unspecified: Secondary | ICD-10-CM

## 2013-11-23 DIAGNOSIS — C4360 Malignant melanoma of unspecified upper limb, including shoulder: Secondary | ICD-10-CM | POA: Insufficient documentation

## 2013-11-23 DIAGNOSIS — K219 Gastro-esophageal reflux disease without esophagitis: Secondary | ICD-10-CM | POA: Insufficient documentation

## 2013-11-23 HISTORY — PX: EXCISION MELANOMA WITH SENTINEL LYMPH NODE BIOPSY: SHX5628

## 2013-11-23 SURGERY — EXCISION, MELANOMA, WITH SENTINEL LYMPH NODE BIOPSY
Anesthesia: General | Site: Chest | Laterality: Right

## 2013-11-23 MED ORDER — CEFAZOLIN SODIUM-DEXTROSE 2-3 GM-% IV SOLR
2.0000 g | INTRAVENOUS | Status: AC
  Administered 2013-11-23: 2 g via INTRAVENOUS

## 2013-11-23 MED ORDER — MIDAZOLAM HCL 2 MG/2ML IJ SOLN
INTRAMUSCULAR | Status: AC
  Filled 2013-11-23: qty 2

## 2013-11-23 MED ORDER — PROPOFOL 10 MG/ML IV BOLUS
INTRAVENOUS | Status: AC
  Filled 2013-11-23: qty 20

## 2013-11-23 MED ORDER — LACTATED RINGERS IV SOLN
INTRAVENOUS | Status: DC
  Administered 2013-11-23 (×2): via INTRAVENOUS

## 2013-11-23 MED ORDER — CEFAZOLIN SODIUM-DEXTROSE 2-3 GM-% IV SOLR
INTRAVENOUS | Status: AC
  Filled 2013-11-23: qty 50

## 2013-11-23 MED ORDER — BUPIVACAINE-EPINEPHRINE 0.25% -1:200000 IJ SOLN
INTRAMUSCULAR | Status: DC | PRN
  Administered 2013-11-23: 20 mL

## 2013-11-23 MED ORDER — TECHNETIUM TC 99M SULFUR COLLOID FILTERED
0.5000 | Freq: Once | INTRAVENOUS | Status: AC | PRN
  Administered 2013-11-23: 0.5 via INTRADERMAL

## 2013-11-23 MED ORDER — METHYLENE BLUE 1 % INJ SOLN
INTRAMUSCULAR | Status: AC
  Filled 2013-11-23: qty 10

## 2013-11-23 MED ORDER — MIDAZOLAM HCL 5 MG/5ML IJ SOLN
INTRAMUSCULAR | Status: DC | PRN
  Administered 2013-11-23: 1 mg via INTRAVENOUS

## 2013-11-23 MED ORDER — BUPIVACAINE-EPINEPHRINE PF 0.25-1:200000 % IJ SOLN
INTRAMUSCULAR | Status: AC
  Filled 2013-11-23: qty 30

## 2013-11-23 MED ORDER — SODIUM CHLORIDE 0.9 % IJ SOLN
INTRAMUSCULAR | Status: AC
  Filled 2013-11-23: qty 10

## 2013-11-23 MED ORDER — BUPIVACAINE-EPINEPHRINE PF 0.5-1:200000 % IJ SOLN
INTRAMUSCULAR | Status: AC
  Filled 2013-11-23: qty 30

## 2013-11-23 MED ORDER — METHYLENE BLUE 1 % INJ SOLN
INTRAMUSCULAR | Status: DC | PRN
  Administered 2013-11-23: 1 mL via SUBMUCOSAL

## 2013-11-23 MED ORDER — LIDOCAINE HCL (CARDIAC) 20 MG/ML IV SOLN
INTRAVENOUS | Status: DC | PRN
  Administered 2013-11-23: 75 mg via INTRAVENOUS

## 2013-11-23 MED ORDER — OXYCODONE HCL 5 MG PO TABS: 5.0000 mg | ORAL_TABLET | Freq: Once | ORAL | Status: DC | PRN

## 2013-11-23 MED ORDER — SODIUM CHLORIDE 0.9 % IJ SOLN
INTRAMUSCULAR | Status: DC | PRN
  Administered 2013-11-23: 2 mL via INTRAVENOUS

## 2013-11-23 MED ORDER — FENTANYL CITRATE 0.05 MG/ML IJ SOLN
50.0000 ug | INTRAMUSCULAR | Status: DC | PRN
  Administered 2013-11-23: 50 ug via INTRAVENOUS

## 2013-11-23 MED ORDER — FENTANYL CITRATE 0.05 MG/ML IJ SOLN
INTRAMUSCULAR | Status: AC
  Filled 2013-11-23: qty 4

## 2013-11-23 MED ORDER — OXYCODONE HCL 5 MG/5ML PO SOLN: 5.0000 mg | Freq: Once | ORAL | Status: DC | PRN

## 2013-11-23 MED ORDER — BACITRACIN-NEOMYCIN-POLYMYXIN 400-5-5000 EX OINT
TOPICAL_OINTMENT | CUTANEOUS | Status: AC
  Filled 2013-11-23: qty 1

## 2013-11-23 MED ORDER — OXYCODONE-ACETAMINOPHEN 7.5-325 MG PO TABS: 1.0000 | ORAL_TABLET | ORAL | Status: DC | PRN

## 2013-11-23 MED ORDER — PROPOFOL 10 MG/ML IV BOLUS
INTRAVENOUS | Status: DC | PRN
  Administered 2013-11-23: 300 mg via INTRAVENOUS

## 2013-11-23 MED ORDER — HYDROMORPHONE HCL PF 1 MG/ML IJ SOLN: 0.2500 mg | INTRAMUSCULAR | Status: DC | PRN

## 2013-11-23 MED ORDER — MIDAZOLAM HCL 2 MG/2ML IJ SOLN
1.0000 mg | INTRAMUSCULAR | Status: DC | PRN
  Administered 2013-11-23: 1 mg via INTRAVENOUS

## 2013-11-23 MED ORDER — ONDANSETRON HCL 4 MG/2ML IJ SOLN
INTRAMUSCULAR | Status: DC | PRN
  Administered 2013-11-23: 4 mg via INTRAVENOUS

## 2013-11-23 MED ORDER — CHLORHEXIDINE GLUCONATE 4 % EX LIQD: 1.0000 "application " | Freq: Once | CUTANEOUS | Status: DC

## 2013-11-23 MED ORDER — ONDANSETRON HCL 4 MG/2ML IJ SOLN: 4.0000 mg | Freq: Once | INTRAMUSCULAR | Status: DC | PRN

## 2013-11-23 MED ORDER — FENTANYL CITRATE 0.05 MG/ML IJ SOLN
INTRAMUSCULAR | Status: AC
  Filled 2013-11-23: qty 2

## 2013-11-23 MED ORDER — DEXAMETHASONE SODIUM PHOSPHATE 4 MG/ML IJ SOLN
INTRAMUSCULAR | Status: DC | PRN
  Administered 2013-11-23: 10 mg via INTRAVENOUS

## 2013-11-23 SURGICAL SUPPLY — 60 items
ADH SKN CLS APL DERMABOND .7 (GAUZE/BANDAGES/DRESSINGS) ×1
APL SKNCLS STERI-STRIP NONHPOA (GAUZE/BANDAGES/DRESSINGS) ×1
APPLIER CLIP 9.375 MED OPEN (MISCELLANEOUS)
APR CLP MED 9.3 20 MLT OPN (MISCELLANEOUS)
BANDAGE ELASTIC 6 VELCRO ST LF (GAUZE/BANDAGES/DRESSINGS) ×1 IMPLANT
BENZOIN TINCTURE PRP APPL 2/3 (GAUZE/BANDAGES/DRESSINGS) ×2 IMPLANT
BLADE SURG 10 STRL SS (BLADE) ×2 IMPLANT
BLADE SURG 15 STRL LF DISP TIS (BLADE) ×1 IMPLANT
BLADE SURG 15 STRL SS (BLADE) ×2
BLADE SURG ROTATE 9660 (MISCELLANEOUS) ×1 IMPLANT
CANISTER SUCT 1200ML W/VALVE (MISCELLANEOUS) ×2 IMPLANT
CHLORAPREP W/TINT 26ML (MISCELLANEOUS) ×2 IMPLANT
CLIP APPLIE 9.375 MED OPEN (MISCELLANEOUS) IMPLANT
COVER MAYO STAND STRL (DRAPES) ×2 IMPLANT
COVER PROBE W GEL 5X96 (DRAPES) ×2 IMPLANT
COVER TABLE BACK 60X90 (DRAPES) ×2 IMPLANT
DECANTER SPIKE VIAL GLASS SM (MISCELLANEOUS) IMPLANT
DERMABOND ADVANCED (GAUZE/BANDAGES/DRESSINGS) ×1
DERMABOND ADVANCED .7 DNX12 (GAUZE/BANDAGES/DRESSINGS) ×1 IMPLANT
DRAPE PED LAPAROTOMY (DRAPES) ×2 IMPLANT
DRAPE UTILITY XL STRL (DRAPES) ×2 IMPLANT
DRSG TEGADERM 4X4.75 (GAUZE/BANDAGES/DRESSINGS) ×4 IMPLANT
ELECT COATED BLADE 2.86 ST (ELECTRODE) ×2 IMPLANT
ELECT REM PT RETURN 9FT ADLT (ELECTROSURGICAL) ×2
ELECTRODE REM PT RTRN 9FT ADLT (ELECTROSURGICAL) ×1 IMPLANT
GLOVE BIOGEL PI IND STRL 7.0 (GLOVE) IMPLANT
GLOVE BIOGEL PI IND STRL 8 (GLOVE) ×1 IMPLANT
GLOVE BIOGEL PI INDICATOR 7.0 (GLOVE) ×1
GLOVE BIOGEL PI INDICATOR 8 (GLOVE) ×1
GLOVE ECLIPSE 6.5 STRL STRAW (GLOVE) ×1 IMPLANT
GLOVE ECLIPSE 8.0 STRL XLNG CF (GLOVE) ×2 IMPLANT
GLOVE EXAM NITRILE MD LF STRL (GLOVE) ×1 IMPLANT
GOWN STRL REUS W/ TWL LRG LVL3 (GOWN DISPOSABLE) ×2 IMPLANT
GOWN STRL REUS W/TWL LRG LVL3 (GOWN DISPOSABLE) ×4
NDL HYPO 25X1 1.5 SAFETY (NEEDLE) ×2 IMPLANT
NDL SAFETY ECLIPSE 18X1.5 (NEEDLE) ×1 IMPLANT
NEEDLE HYPO 18GX1.5 SHARP (NEEDLE) ×2
NEEDLE HYPO 25X1 1.5 SAFETY (NEEDLE) ×4 IMPLANT
NS IRRIG 1000ML POUR BTL (IV SOLUTION) ×2 IMPLANT
PACK BASIN DAY SURGERY FS (CUSTOM PROCEDURE TRAY) ×2 IMPLANT
PENCIL BUTTON HOLSTER BLD 10FT (ELECTRODE) ×2 IMPLANT
SLEEVE SCD COMPRESS KNEE MED (MISCELLANEOUS) ×1 IMPLANT
SPONGE GAUZE 4X4 12PLY (GAUZE/BANDAGES/DRESSINGS) ×1 IMPLANT
SPONGE GAUZE 4X4 12PLY STER LF (GAUZE/BANDAGES/DRESSINGS) IMPLANT
SPONGE LAP 4X18 X RAY DECT (DISPOSABLE) ×2 IMPLANT
STAPLER VISISTAT 35W (STAPLE) ×2 IMPLANT
STRIP CLOSURE SKIN 1/2X4 (GAUZE/BANDAGES/DRESSINGS) ×2 IMPLANT
SUT ETHILON 2 0 FS 18 (SUTURE) ×3 IMPLANT
SUT ETHILON 3 0 PS 1 (SUTURE) ×2 IMPLANT
SUT MON AB 4-0 PC3 18 (SUTURE) ×2 IMPLANT
SUT SILK 2 0 SH (SUTURE) ×1 IMPLANT
SUT VIC AB 2-0 SH 27 (SUTURE) ×4
SUT VIC AB 2-0 SH 27XBRD (SUTURE) ×1 IMPLANT
SUT VICRYL 3-0 CR8 SH (SUTURE) ×2 IMPLANT
SYR CONTROL 10ML LL (SYRINGE) ×2 IMPLANT
SYR TB 1ML 26GX3/8 SAFETY (SYRINGE) ×1 IMPLANT
TOWEL OR 17X24 6PK STRL BLUE (TOWEL DISPOSABLE) ×2 IMPLANT
TOWEL OR NON WOVEN STRL DISP B (DISPOSABLE) ×1 IMPLANT
TUBE CONNECTING 20X1/4 (TUBING) ×2 IMPLANT
YANKAUER SUCT BULB TIP NO VENT (SUCTIONS) ×2 IMPLANT

## 2013-11-23 NOTE — Anesthesia Preprocedure Evaluation (Signed)
Anesthesia Evaluation  Patient identified by MRN, date of birth, ID band Patient awake    Reviewed: Allergy & Precautions, H&P , NPO status , Patient's Chart, lab work & pertinent test results  Airway Mallampati: I TM Distance: >3 FB Neck ROM: Full    Dental  (+) Teeth Intact, Dental Advisory Given   Pulmonary  breath sounds clear to auscultation        Cardiovascular hypertension, Pt. on medications Rhythm:Regular Rate:Normal     Neuro/Psych    GI/Hepatic GERD-  Medicated and Controlled,  Endo/Other  Morbid obesity  Renal/GU      Musculoskeletal   Abdominal   Peds  Hematology   Anesthesia Other Findings   Reproductive/Obstetrics                           Anesthesia Physical Anesthesia Plan  ASA: II  Anesthesia Plan: General   Post-op Pain Management:    Induction: Intravenous  Airway Management Planned: LMA  Additional Equipment:   Intra-op Plan:   Post-operative Plan: Extubation in OR  Informed Consent: I have reviewed the patients History and Physical, chart, labs and discussed the procedure including the risks, benefits and alternatives for the proposed anesthesia with the patient or authorized representative who has indicated his/her understanding and acceptance.   Dental advisory given  Plan Discussed with: CRNA, Anesthesiologist and Surgeon  Anesthesia Plan Comments:         Anesthesia Quick Evaluation

## 2013-11-23 NOTE — Transfer of Care (Signed)
Immediate Anesthesia Transfer of Care Note  Patient: Mason Stewart  Procedure(s) Performed: Procedure(s): EXCISION MELANOMA WITH SENTINEL LYMPH NODE BIOPSY (Right)  Patient Location: PACU  Anesthesia Type:General  Level of Consciousness: awake, alert , oriented and patient cooperative  Airway & Oxygen Therapy: Patient Spontanous Breathing and Patient connected to face mask oxygen  Post-op Assessment: Report given to PACU RN and Post -op Vital signs reviewed and stable  Post vital signs: Reviewed and stable  Complications: No apparent anesthesia complications

## 2013-11-23 NOTE — Brief Op Note (Signed)
11/23/2013  12:09 PM  PATIENT:  Mason Stewart  63 y.o. male  PRE-OPERATIVE DIAGNOSIS:  Right chest melanoma   POST-OPERATIVE DIAGNOSIS:  Right chest melanoma   PROCEDURE:  Procedure(s): EXCISION MELANOMA WITH SENTINEL LYMPH NODE BIOPSY (Right)  SURGEON:  Surgeon(s) and Role:    * Generoso Cropper A. Dearies Meikle, MD - Primary    ANESTHESIA:   local and general  EBL:  Total I/O In: 1200 [I.V.:1200] Out: -       LOCAL MEDICATIONS USED:  MARCAINE     SPECIMEN:  Source of Specimen:  RIGHT SHOULDER AND RIGHT AXILLA  DISPOSITION OF SPECIMEN:  PATHOLOGY  COUNTS:  YES  TOURNIQUET:  * No tourniquets in log *  DICTATION: .Other Dictation: Dictation Number [id number]  PLAN OF CARE: Discharge to home after PACU  PATIENT DISPOSITION:  PACU - hemodynamically stable.   Delay start of Pharmacological VTE agent (>24hrs) due to surgical blood loss or risk of bleeding: not applicable

## 2013-11-23 NOTE — Interval H&P Note (Signed)
History and Physical Interval Note:  11/23/2013 10:54 AM  Mason Stewart  has presented today for surgery, with the diagnosis of melanoma   The various methods of treatment have been discussed with the patient and family. After consideration of risks, benefits and other options for treatment, the patient has consented to  Procedure(s): EXCISION MELANOMA WITH SENTINEL LYMPH NODE BIOPSY (Right) as a surgical intervention .  The patient's history has been reviewed, patient examined, no change in status, stable for surgery.  I have reviewed the patient's chart and labs.  Questions were answered to the patient's satisfaction.     Mason Stewart A.

## 2013-11-23 NOTE — Anesthesia Postprocedure Evaluation (Signed)
  Anesthesia Post-op Note  Patient: Mason Stewart  Procedure(s) Performed: Procedure(s): EXCISION MELANOMA WITH SENTINEL LYMPH NODE BIOPSY (Right)  Patient Location: PACU  Anesthesia Type:General  Level of Consciousness: awake, alert  and oriented  Airway and Oxygen Therapy: Patient Spontanous Breathing  Post-op Pain: none  Post-op Assessment: Post-op Vital signs reviewed  Post-op Vital Signs: Reviewed  Complications: No apparent anesthesia complications

## 2013-11-23 NOTE — Discharge Instructions (Signed)
Excision of Skin Lesions Excision of a skin lesion refers to the removal of a section of skin by making small cuts (incisions) in the skin. This is typically done to remove a cancerous growth (basal cell carcinoma, squamous cell carcinoma, or melanoma) or a noncancerous growth (cyst). It may be done to treat or prevent cancer or infection. It may also be done to improve cosmetic appearance (removal of mole, skin tag). LET YOUR CAREGIVER KNOW ABOUT:   Allergies to food or medicine.  Medicines taken, including vitamins, herbs, eyedrops, over-the-counter medicines, and creams.  Use of steroids (by mouth or creams).  Previous problems with anesthetics or numbing medicines.  History of bleeding problems or blood clots.  History of any prostheses.  Previous surgery.  Other health problems, including diabetes and kidney problems.  Possibility of pregnancy, if this applies. RISKS AND COMPLICATIONS  Many complications can be managed. With appropriate treatment and rehabilitation, the following complications are very uncommon:  Bleeding.  Infection.  Scarring.  Recurrence of cyst or cancer.  Changes in skin sensation or appearance (discoloration, swelling).  Reaction to anesthesia.  Allergic reaction to surgical materials or ointments.  Damage to nerves, blood vessels, muscles, or other structures.  Continued pain. BEFORE THE PROCEDURE  It is important to follow your caregiver's instructions prior to your procedure to avoid complications. Steps before your procedure may include:  Physical exam, blood tests, other procedures, such as removing a small sample for examination under a microscope (biopsy).  Your caregiver may review the procedure, the anesthesia being used, and what to expect after the procedure with you. You may be asked to:  Stop taking certain medicines, such as blood thinners (including aspirin, clopidogrel, ibuprofen), for several days prior to your  procedure.  Take certain medicines.  Stop smoking. It is a good idea to arrange for a ride home after surgery and to have someone to help you with activities during recovery. PROCEDURE  There are several excision techniques. The type of excision or surgical technique used will depend on your condition, the location of the lesion, and your overall health. After the lesion is sterilized and a local anesthetic is applied, the following may be performed: Complete surgical excision The area to be removed is marked with a pen. Using a small scalpel and scissors, the surgeon gently cuts around and under the lesion until it is completely removed. The lesion is placed in a special fluid and sent to the lab for examination. If necessary, bleeding will be controlled with a device that delivers heat. The edges of the wound are stitched together and a dressing is applied. This procedure may be performed to treat a cancerous growth or noncancerous cyst or lesion. Surgeons commonly perform an elliptical excision, to minimize scarring. Excision of a cyst The surgeon makes an incision on the cyst. The entire cyst is removed through the incision. The wound may be closed with a suture (stitch). Shave excision During shave excision, the surgeon uses a small blade or loop instrument to shave off the lesion. This may be done to remove a mole or skin tag. The wound is usually left to heal on its own without stitches. Punch excision During punch excision, the surgeon uses a small, round tool (like a cookie cutter) to cut a circle shape out of the skin. The outer edges of the skin are stitched together. This may be done to remove a mole or scar or to perform a biopsy of the lesion. Mohs micrographic surgery During  Mohs micrographic surgery, layers of the lesion are removed with a scalpel or loop instrument and immediately examined under a microscope until all of the abnormal or cancerous tissue is removed. This procedure is  minimally invasive and ensures the best cosmetic outcome, with removal of as little normal tissue as possible. Mohs is usually done to treat skin cancer, such as basal cell carcinoma or squamous cell carcinoma, particularly on the face and ears. Antibiotic ointment is applied to the surgical area after each of the procedures listed above, as necessary. AFTER THE PROCEDURE  How well you heal depends on many factors. Most patients heal quite well with proper techniques and self-care. Scarring will lessen over time. HOME CARE INSTRUCTIONS   Take medicines for pain as directed.  Keep the incision area clean, dry, and protected for at least 48 hours. Change dressings as directed.  For bleeding, apply gentle but firm pressure to the wound using a folded towel for 20 minutes. Call your caregiver if bleeding does not stop.  Avoid high-impact exercise and activities until the stitches are removed or the area heals.  Follow your caregiver's instructions to minimize scarring. Avoid sun exposure until the area has healed. Scarring should lessen over time.  Follow up with your caregiver as directed. Removal of stitches within 4 to 14 days may be necessary. Finding out the results of your test Not all test results are available during your visit. If your test results are not back during the visit, make an appointment with your caregiver to find out the results. Do not assume everything is normal if you have not heard from your caregiver or the medical facility. It is important for you to follow up on all of your test results. SEEK MEDICAL CARE IF:   You or your child has an oral temperature above 102 F (38.9 C).  You develop signs of infection (chills, feeling unwell).  You notice bleeding, pain, discharge, redness, or swelling at the incision site.  You notice skin irregularities or changes in sensation. MAKE SURE YOU:   Understand these instructions.  Will watch your condition.  Will get help  right away if you are not doing well or get worse. FOR MORE INFORMATION  American Academy of [employer]icians: www.AromatherapyParty.no American Academy of Dermatology: http://jones-macias.info/ Document Released: 12/24/2009 Document Revised: 12/22/2011 Document Reviewed: 12/24/2009 [facility] Patient Information 2014 [place]. GENERAL SURGERY: POST OP INSTRUCTIONS  1. DIET: Follow a light bland diet the first 24 hours after arrival home, such as soup, liquids, crackers, etc.  Be sure to include lots of fluids daily.  Avoid fast food or heavy meals as your are more likely to get nauseated.   2. Take your usually prescribed home medications unless otherwise directed. 3. PAIN CONTROL: a. Pain is best controlled by a usual combination of three different methods TOGETHER: i. Ice/Heat ii. Over the counter pain medication iii. Prescription pain medication b. Most patients will experience some swelling and bruising around the incisions.  Ice packs or heating pads (30-60 minutes up to 6 times a day) will help. Use ice for the first few days to help decrease swelling and bruising, then switch to heat to help relax tight/sore spots and speed recovery.  Some people prefer to use ice alone, heat alone, alternating between ice & heat.  Experiment to what works for you.  Swelling and bruising can take several weeks to resolve.   c. It is helpful to take an over-the-counter pain medication regularly for the first few  weeks.  Choose one of the following that works best for you: i. Naproxen (Aleve, etc)  Two 220mg  tabs twice a day ii. Ibuprofen (Advil, etc) Three 200mg  tabs four times a day (every meal & bedtime) iii. Acetaminophen (Tylenol, etc) 500-650mg  four times a day (every meal & bedtime) d. A  prescription for pain medication (such as oxycodone, hydrocodone, etc) should be given to you upon discharge.  Take your pain medication as prescribed.  i. If you are having problems/concerns with the prescription medicine (does not  control pain, nausea, vomiting, rash, itching, etc), please call us [phone] to see if we need to switch you to a different pain medicine that will work better for you and/or control your side effect better. ii. If you need a refill on your pain medication, please contact your pharmacy.  They will contact our office to request authorization. Prescriptions will not be filled after 5 pm or on week-ends. 4. Avoid getting constipated.  Between the surgery and the pain medications, it is common to experience some constipation.  Increasing fluid intake and taking a fiber supplement (such as Metamucil, Citrucel, FiberCon, MiraLax, etc) 1-2 times a day regularly will usually help prevent this problem from occurring.  A mild laxative (prune juice, Milk of Magnesia, MiraLax, etc) should be taken according to package directions if there are no bowel movements after 48 hours.   5. Wash / shower every day.  You may shower over the dressings as they are waterproof.  Continue to shower over incision(s) after the dressing is off. 6. Remove your waterproof bandages 5 days after surgery.  You may leave the incision open to air.  You may have skin tapes (Steri Strips) covering the incision(s).  Leave them on until one week, then remove.  You may replace a dressing/Band-Aid to cover the incision for comfort if you wish.      7. ACTIVITIES as tolerated:   a. You may resume regular (light) daily activities beginning the next day--such as daily self-care, walking, climbing stairs--gradually increasing activities as tolerated.  If you can walk 30 minutes without difficulty, it is safe to try more intense activity such as jogging, treadmill, bicycling, low-impact aerobics, swimming, etc. b. Save the most intensive and strenuous activity for last such as sit-ups, heavy lifting, contact sports, etc  Refrain from any heavy lifting or straining until you are off narcotics for pain control.   c. DO NOT PUSH THROUGH PAIN.  Let  pain be your guide: If it hurts to do something, don't do it.  Pain is your body warning you to avoid that activity for another week until the pain goes down. d. You may drive when you are no longer taking prescription pain medication, you can comfortably wear a seatbelt, and you can safely maneuver your car and apply brakes. e. Bonita QuinYou may have sexual intercourse when it is comfortable.  8. FOLLOW UP in our office a. Please call [facility] at [phone] to set up an appointment to see your surgeon in the office for a follow-up appointment approximately 2-3 weeks after your surgery. b. Make sure that you call for this appointment the day you arrive home to insure a convenient appointment time. 9. IF YOU HAVE DISABILITY OR FAMILY LEAVE FORMS, BRING THEM TO THE OFFICE FOR PROCESSING.  DO NOT GIVE THEM TO YOUR DOCTOR.   WHEN TO CALL US [phone]: 1. Poor pain control 2. Reactions / problems with new medications (rash/itching, nausea, etc)  3. Fever  over 101.5 F (38.5 C) 4. Worsening swelling or bruising 5. Continued bleeding from incision. 6. Increased pain, redness, or drainage from the incision 7. Difficulty breathing / swallowing   The clinic staff is available to answer your questions during regular business hours (8:30am-5pm).  Please dont hesitate to call and ask to speak to one of our nurses for clinical concerns.   If you have a medical emergency, go to the nearest emergency room or call 911.  A surgeon from [facility] is always on call at the [facility], [place], [place], [place], [place]  [address] ? MAIN: [phone] ? TOLL FREE: [phone] ?  FAX [phone] [email or web address]    Post Anesthesia Home Care Instructions  Activity: Get plenty of rest for the remainder of the day. A responsible adult should stay with you for 24 hours following the procedure.  For the next 24 hours, DO NOT: -Drive a  car -Paediatric nurse -Drink alcoholic beverages -Take any medication unless instructed by your physician -Make any legal decisions or sign important papers.  Meals: Start with liquid foods such as gelatin or soup. Progress to regular foods as tolerated. Avoid greasy, spicy, heavy foods. If nausea and/or vomiting occur, drink only clear liquids until the nausea and/or vomiting subsides. Call your physician if vomiting continues.  Special Instructions/Symptoms: Your throat may feel dry or sore from the anesthesia or the breathing tube placed in your throat during surgery. If this causes discomfort, gargle with warm salt water. The discomfort should disappear within 24 hours.

## 2013-11-23 NOTE — H&P (View-Only) (Signed)
Patient ID: Mason Stewart, male   DOB: September 12, 1951, 63 y.o.   MRN: [id number]  Chief Complaint  Patient presents with  . New Evaluation    eval Rt upper chest malignant melanoma    HPI Mason Stewart is a 63 y.o. male.   HPIpatient sent a request of  Mason Shelling, MD For right chest melanoma.  See path report in  Note for details.  Patient had skin lesion there for 2 years. He begin to change. No ulceration. It was pigmented. Past Medical History  Diagnosis Date  . Hypertension   . Cancer     prostate cancer  . GERD (gastroesophageal reflux disease)   . Sleep apnea     stopbang =7  . Glaucoma (increased eye pressure)     Past Surgical History  Procedure Laterality Date  . Back surgery  1997    lower back  . Left middle finger surgery after injury  yrs ago  . Colonscopy  2012  . Robot assisted laparoscopic radical prostatectomy  06/07/2012    Procedure: ROBOTIC ASSISTED LAPAROSCOPIC RADICAL PROSTATECTOMY LEVEL 2;  Surgeon: Dutch Gray, MD;  Location: [facility];  Service: Urology;  Laterality: N/A;        Family History  Problem Relation Age of Onset  . Heart disease Mother   . Cancer Father     mesthelioma    Social History History  Substance Use Topics  . Smoking status: Never Smoker   . Smokeless tobacco: Never Used  . Alcohol Use: Yes     Comment: occasional    No Known Allergies  Current Outpatient Prescriptions  Medication Sig Dispense Refill  . bimatoprost (LUMIGAN) 0.03 % ophthalmic solution Place 1 drop into both eyes at bedtime.      Marland Kitchen losartan-hydrochlorothiazide (HYZAAR) 50-12.5 MG per tablet Take 1 tablet by mouth daily before breakfast.      . omeprazole (PRILOSEC OTC) 20 MG tablet Take 20 mg by mouth every morning.       Marland Kitchen omeprazole (PRILOSEC) 20 MG capsule Take 20 mg by mouth daily.       No current facility-administered medications for this visit.    Review of Systems Review of Systems  Constitutional: Negative for fever, chills  and unexpected weight change.  HENT: Negative for congestion, hearing loss, sore throat, trouble swallowing and voice change.   Eyes: Negative for visual disturbance.  Respiratory: Negative for cough and wheezing.   Cardiovascular: Negative for chest pain, palpitations and leg swelling.  Gastrointestinal: Negative for nausea, vomiting, abdominal pain, diarrhea, constipation, blood in stool, abdominal distention, anal bleeding and rectal pain.  Genitourinary: Negative for hematuria and difficulty urinating.  Musculoskeletal: Negative for arthralgias.  Skin: Negative for rash and wound.  Neurological: Negative for seizures, syncope, weakness and headaches.  Hematological: Negative for adenopathy. Does not bruise/bleed easily.  Psychiatric/Behavioral: Negative for confusion.    Blood pressure 140/90, pulse 72, temperature 98.1 F (36.7 C), temperature source Temporal, resp. rate 14, height 5\' 6"  (1.676 m), weight 231 lb 12.8 oz (105.144 kg).  Physical Exam Physical Exam  Constitutional: He is oriented to person, place, and time. He appears well-developed and well-nourished.  Eyes: Pupils are equal, round, and reactive to light. No scleral icterus.  Neck: Normal range of motion. Neck supple.  Cardiovascular: Normal rate and regular rhythm.   Pulmonary/Chest: Effort normal and breath sounds normal.    Musculoskeletal: Normal range of motion.  Lymphadenopathy:    He has no cervical adenopathy.  He has no axillary adenopathy.  Neurological: He is alert and oriented to person, place, and time.  Skin: Skin is warm and dry.  Psychiatric: He has a normal mood and affect. His behavior is normal. Judgment and thought content normal.    Data Reviewed Superficial spreading melanoma 1.82 mm thickness Clark level IV positive for depression risk lymphocytic infiltration no ulceration right chest   Assessment    Stage II melanoma    Plan    Recommend wide excision 1 cm margin and right  axillary sentinel lymph node mapping. Obtain preoperative lymphoscintigraphy prior to surgery.Sentinel lymph node mapping and dissection has been discussed with the patient.  Risk of bleeding,  Infection,  Seroma formation,  Additional procedures,,  Shoulder weakness ,  Shoulder stiffness,  Nerve and blood vessel injury and reaction to the mapping dyes have been discussed.  Alternatives to surgery have been discussed with the patient.  The patient agrees to proceed.The procedure has been discussed with the patient.  Alternative therapies have been discussed with the patient.  Operative risks include bleeding,  Infection,  Organ injury,  Nerve injury,  Blood vessel injury,  DVT,  Pulmonary embolism,  Death,  And possible reoperation.  Medical management risks include worsening of present situation.  The success of the procedure is 50 -90 % at treating patients symptoms.  The patient understands and agrees to proceed.       Mason Potts A. 11/04/2013, 12:32 PM

## 2013-11-24 ENCOUNTER — Encounter (HOSPITAL_BASED_OUTPATIENT_CLINIC_OR_DEPARTMENT_OTHER): Payer: Self-pay | Admitting: Surgery

## 2013-11-24 NOTE — Op Note (Signed)
NAMEKEMONTE, ULLMAN              ACCOUNT NO.:  [id number]  MEDICAL RECORD NO.:  [id number]  LOCATION:                                 FACILITY:  PHYSICIAN:  Chigozie Basaldua A. Haili Donofrio, M.D.DATE OF BIRTH:  01-26-1951  DATE OF PROCEDURE:  11/23/2013 DATE OF DISCHARGE:                              OPERATIVE REPORT   PREOPERATIVE DIAGNOSIS:  Stage II melanoma, right shoulder.  POSTOPERATIVE DIAGNOSIS:  Stage II melanoma, right shoulder.  PROCEDURE: 1. Wide excision, right shoulder melanoma with 1 cm circumferential     margin. 2. Right axillary sentinel lymph node mapping using methylene blue     dye. 3. Skin and subcutaneous fat advancement flap formation, total area is     6 cm.  SURGEON:  Marcello Moores A. Samreet Edenfield, M.D.  ANESTHESIA:  LMA with 0.25% Marcaine with epinephrine.  EBL:  Minimal.  SPECIMEN: 1. Area of right skin melanoma was removed. 2. Right axillary sentinel lymph node x1.  DRAINS:  None.  EBL:  Minimal.  IV FLUIDS:  4 mL.  INDICATIONS FOR PROCEDURE:  The patient presents with stage II melanoma involving his right shoulder.  He requires wide excision.  Also given that he is Clark level IV, recommended sentinel lymph node mapping as well.  Risks, benefits, and alternative therapies discussed.  Risk of bleeding, infection, the need for wide excision with significant scar formation, arm swelling, more surgery, other treatments, nerve injury, blood vessel injury, the need for an axillary dissection if the node is positive, lymphedema, and other potential risks discussed.  He understood and agreed to proceed.  DESCRIPTION OF PROCEDURE:  The patient was met in the holding area.  The area was injected by Nuclear Medicine to his right shoulder preop.  Area was marked and questions were answered.  He was taken back to the operating room and placed supine with his right arm on arm board out to his side.  After induction of general anesthesia, the area was shaved. I used  methylene blue dye with 1 mL of dye with 2 mL of saline injected in a subcuticular position under the excision site.  This was then prepped and draped.  Time-out was done.  He received 2 g of Ancef. Margin of 1 cm from the edge of the excision was measured out.  A large scar was there so it was unclear exactly whether this was in the middle of this, I used as the previous excision as my mark.  Elliptical incision was made above and below this with 1 cm margins circumferentially.  The entire specimen was taken down to the fascia of the deltoid muscle.  This was taken with the specimen.  This was removed.  We then irrigated this out.  Next, sentinel node was on the right axilla.  Neoprobe was used and hotspot was identified.  Incision was made over the hot spot.  The node was found and was blue and hot. Background counts were 0 after removal of this node.  It was sent to Pathology for evaluation.  The wound was found to be hemostatic and closed with a deep layer of 3-0 Vicryl and 4-0 Monocryl subcuticular stitch.  Dermabond applied.  I  then created advancement flaps of skin and subcutaneous fat around the open wound on the right shoulder.  The total length of the wound was 6 cm.  These flaps were taken at least 2-3 cm inferiorly.  I then used 2-0 nylon to close the skin over this, since there was not enough back to close below.  Neosporin applied.  Dry dressing was applied to this.  All final counts of sponge, needle, and instruments were found to be correct at this portion of the case.  The patient was awoke, extubated, and taken to the recovery room in a satisfactory condition.  All final counts were found to be correct.     Jaray Boliver A. Muhamad Serano, M.D.     TAC/MEDQ  D:  11/23/2013  T:  11/24/2013  Job:  [id number]

## 2013-11-28 ENCOUNTER — Other Ambulatory Visit (INDEPENDENT_AMBULATORY_CARE_PROVIDER_SITE_OTHER): Payer: Self-pay | Admitting: Surgery

## 2013-11-28 ENCOUNTER — Telehealth (INDEPENDENT_AMBULATORY_CARE_PROVIDER_SITE_OTHER): Payer: Self-pay

## 2013-11-28 DIAGNOSIS — C439 Malignant melanoma of skin, unspecified: Secondary | ICD-10-CM

## 2013-11-28 NOTE — Telephone Encounter (Signed)
Pt's wife calling for pathology results.  Results are available, but would like Dr. Brantley Stage to inform of same.

## 2013-11-28 NOTE — Progress Notes (Signed)
Pt informed of results.  Will need axillary LN dissection on right and oncology referral.  Discussed with  Patient.

## 2013-12-05 ENCOUNTER — Ambulatory Visit (INDEPENDENT_AMBULATORY_CARE_PROVIDER_SITE_OTHER): Payer: BC Managed Care – PPO | Admitting: Surgery

## 2013-12-05 ENCOUNTER — Encounter (INDEPENDENT_AMBULATORY_CARE_PROVIDER_SITE_OTHER): Payer: Self-pay

## 2013-12-05 ENCOUNTER — Encounter: Payer: Self-pay | Admitting: *Deleted

## 2013-12-05 ENCOUNTER — Encounter (INDEPENDENT_AMBULATORY_CARE_PROVIDER_SITE_OTHER): Payer: Self-pay | Admitting: Surgery

## 2013-12-05 VITALS — BP 152/90 | HR 92 | Temp 98.7°F | Resp 16 | Ht 66.0 in | Wt 231.8 lb

## 2013-12-05 DIAGNOSIS — Z9889 Other specified postprocedural states: Secondary | ICD-10-CM

## 2013-12-05 MED ORDER — DOXYCYCLINE HYCLATE 50 MG PO CAPS: 100.0000 mg | ORAL_CAPSULE | Freq: Two times a day (BID) | ORAL | Status: DC

## 2013-12-05 NOTE — Progress Notes (Signed)
Received referral.  Request given to Dr. Jana Hakim.

## 2013-12-05 NOTE — Progress Notes (Signed)
The patient returns 12 days after wide excision of upper chest melanoma with sentinel lymph node mapping. He is doing okay. Final pathology was reviewed today. He does have a positive sentinel node. Margins were negative.  Exam: Sutures noted to right upper chest wound. These removed. He went to put his shirt on and this area separated.  This was cleaned and closed again with 2 0 nylon.  Axilla clean and intact.   1. Lymph node, sentinel, biopsy, Right axillary #1 - METASTATIC MELANOMA IN ONE OF ONE LYMPH NODE (1/1). 2. Skin , Right chest - FOCAL RESIDUAL MALIGNANT MELANOMA - RESECTION MARGINS ARE NEGATIVE. - SEE ONCOLOGY TABLE. Microscopic Comment 2. MELANOMA TABLEType: Superficial spreading. Clark's Level: Residual is II, Original is IV Breslow's measurement (millimeters): Residual is 0.25 mm, Original is 1.82 mm. Host response: Residual is nonbrisk, Original is brisk. Regression: Present. Ulceration: Absent. Satellitosis: Absent. Margin: Negative. Comment: There is only a small focus of residual melanoma present in one of the sections. Therefore the primary tumor staging is unchanged from the shave biopsy ([id number]) and applicable data for both specimens is included above. However, there is metastatic melanoma in one of one lymph nodes (1/1) and thus, the final pathologic stage based on original biopsy and new lymph node is pT2a, pN1, pMx. Dr. Augustin Schooling has reviewed the case. Vicente Males MD Pathologist, Electronic Signature (Case signed 11/28/2013) Specimen Gross and Clinical Information  Plan: Patient requires right axillary lymph node dissection. This is arranged for next week. Refer to oncology. Discussed risk of the dissection to the patient to include complications of bleeding, infection, lymphedema, shoulder stiffness, numbness around the incision, and the need for other potential procedures. He agreed to proceed.

## 2013-12-05 NOTE — Patient Instructions (Signed)
Apply neosporin and ice to area.  Take antibiotics until gone.  If you do not hear from oncology by wed let us know.

## 2013-12-07 ENCOUNTER — Encounter (HOSPITAL_BASED_OUTPATIENT_CLINIC_OR_DEPARTMENT_OTHER): Payer: Self-pay | Admitting: *Deleted

## 2013-12-07 ENCOUNTER — Encounter (INDEPENDENT_AMBULATORY_CARE_PROVIDER_SITE_OTHER): Payer: BC Managed Care – PPO | Admitting: General Surgery

## 2013-12-07 NOTE — Progress Notes (Signed)
Had labs and ekg-11/17/13-no other labs ordered

## 2013-12-08 ENCOUNTER — Telehealth: Payer: Self-pay | Admitting: Hematology and Oncology

## 2013-12-08 NOTE — Telephone Encounter (Signed)
S/W PT AND GAVE NEW PATIENT APPT FOR 03/02 @ 10:45 W/DR. [place].

## 2013-12-09 ENCOUNTER — Telehealth (INDEPENDENT_AMBULATORY_CARE_PROVIDER_SITE_OTHER): Payer: Self-pay | Admitting: General Surgery

## 2013-12-09 NOTE — Telephone Encounter (Signed)
Pt of Dr. Brantley Stage called to report his incision openned slightly during his OV with Dr. Brantley Stage, who then resutured it while pt was still here.  Since then another suture has opened.  Pt asked if he should come in to get it resutured, too.  Explained that if an area is open for more than 24 hours, it is not advised to close it, as there is a greater risk for infection.  Pt states his is already on an antibiotic, the entire area appears healthy and clean.  Advised pt to continue to shower and wash incision with antibacterial soap (using hands, not washcloth,) rinse thoroughly with warm water and pat dry with towel.  Cover exposed area with gauze.  Call back for symptoms of infection, including increased redness, exudate or odor.  He understands.

## 2013-12-12 ENCOUNTER — Ambulatory Visit (HOSPITAL_BASED_OUTPATIENT_CLINIC_OR_DEPARTMENT_OTHER): Payer: BC Managed Care – PPO

## 2013-12-12 ENCOUNTER — Encounter: Payer: Self-pay | Admitting: Hematology and Oncology

## 2013-12-12 ENCOUNTER — Ambulatory Visit (HOSPITAL_BASED_OUTPATIENT_CLINIC_OR_DEPARTMENT_OTHER): Payer: BC Managed Care – PPO | Admitting: Hematology and Oncology

## 2013-12-12 ENCOUNTER — Telehealth: Payer: Self-pay | Admitting: Hematology and Oncology

## 2013-12-12 VITALS — BP 147/85 | HR 95 | Temp 98.6°F | Resp 18 | Ht 66.0 in | Wt 232.9 lb

## 2013-12-12 DIAGNOSIS — C61 Malignant neoplasm of prostate: Secondary | ICD-10-CM

## 2013-12-12 DIAGNOSIS — C773 Secondary and unspecified malignant neoplasm of axilla and upper limb lymph nodes: Secondary | ICD-10-CM

## 2013-12-12 DIAGNOSIS — Z8546 Personal history of malignant neoplasm of prostate: Secondary | ICD-10-CM

## 2013-12-12 DIAGNOSIS — C779 Secondary and unspecified malignant neoplasm of lymph node, unspecified: Secondary | ICD-10-CM

## 2013-12-12 DIAGNOSIS — C4359 Malignant melanoma of other part of trunk: Secondary | ICD-10-CM

## 2013-12-12 DIAGNOSIS — I10 Essential (primary) hypertension: Secondary | ICD-10-CM

## 2013-12-12 DIAGNOSIS — T8130XA Disruption of wound, unspecified, initial encounter: Secondary | ICD-10-CM

## 2013-12-12 DIAGNOSIS — K219 Gastro-esophageal reflux disease without esophagitis: Secondary | ICD-10-CM

## 2013-12-12 HISTORY — DX: Disruption of wound, unspecified, initial encounter: T81.30XA

## 2013-12-12 HISTORY — DX: Secondary and unspecified malignant neoplasm of lymph node, unspecified: C77.9

## 2013-12-12 HISTORY — DX: Personal history of malignant neoplasm of prostate: Z85.46

## 2013-12-12 HISTORY — DX: Malignant melanoma of other part of trunk: C43.59

## 2013-12-12 NOTE — Progress Notes (Signed)
Checked in new patient with no financial issues. He has appt card- per Tiffany he was suppose to see Dr. Lorelle Formosa but was switched to [facility].

## 2013-12-12 NOTE — Progress Notes (Signed)
[facility] CONSULT NOTE  Patient Care Team: Irven Shelling, MD as PCP - General (Internal Medicine)  CHIEF COMPLAINTS/PURPOSE OF CONSULTATION:  Malignant melanoma, T2 N1 MX  HISTORY OF PRESENTING ILLNESS:  Mason Stewart 63 y.o. male is here because of recent diagnosis of melanoma. According to the patient, he had multiple moles for a while A year ago, the patient noted a prominent mole on the right anterior chest wall area. Around October 2014, he felt that the mole has changed in character with presence of a new line within the mole and it had gotten slightly bigger. He denies any ulceration or bleeding coming from the mole. He subsequently underwent further evaluation by his primary care provider who referred him to a dermatologist. On 10/20/2013, he underwent a biopsy of the mole on the right upper chest area that confirmed diagnosis of superficial spreading melanoma, with a maximum tumor thickness of 1.82 mm, extending to level IV. There were no evidence of ulceration. The margins were involved. Mitotic index is greater than 1 mm per square, with no evidence of lymphovascular invasion. Tumor infiltrating lymphocytes were brisk and there were extensive tumor regression noted. He was subsequently refer to General surgery. On 11/23/2013, he underwent wide local excision with sentinel lymph node biopsy on the right axilla. Pathology confirmed residual superficial spreading melanoma with negative margins. The sentinel lymph node was positive. After surgery, he had wound dehiscence managed with stitches, antibiotics and wound care by his surgeon.  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 He usually does not wear sunscreen when he goes out but tends to avoid excessive sun exposure.  In 2013, the patient was noted to have elevated PSA. 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 There were no evidence of angiolymphatic or extraprostatic extension. Multiple lymph nodes resection came back negative. After surgery, he's PSA dropped to undetectable. The patient did not require adjuvant treatment. Final staging for his prostate cancer was [id number] MEDICAL HISTORY:  Past Medical History  Diagnosis Date  . Hypertension   . Cancer     prostate cancer  . GERD (gastroesophageal reflux disease)   . Glaucoma (increased eye pressure)   . Sleep apnea     stopbang =7-has not had a study  . Melanoma     SURGICAL HISTORY: Past Surgical History  Procedure Laterality Date  . Back surgery  1997    lower back  . Left middle finger surgery after injury  yrs ago  . Colonscopy  2012  . Robot assisted laparoscopic radical prostatectomy  06/07/2012    Procedure: ROBOTIC ASSISTED LAPAROSCOPIC RADICAL PROSTATECTOMY LEVEL 2;  Surgeon: Dutch Gray, MD;  Location: [facility];  Service: Urology;  Laterality: N/A;      . Excision melanoma with sentinel lymph node biopsy Right 11/23/2013    Procedure: EXCISION MELANOMA WITH SENTINEL LYMPH NODE BIOPSY;  Surgeon: Joyice Faster. Cornett, MD;  Location: [facility];  Service: General;  Laterality: Right;    SOCIAL HISTORY: History   Social History  . Marital Status: Married    Spouse Name: N/A    Number of Children: N/A  . Years of Education: N/A   Occupational History  . Not on file.   Social History Main Topics  . Smoking status: Never Smoker   . Smokeless tobacco: Never  Used  . Alcohol Use: Yes     Comment: occasional  . Drug Use: No  . Sexual Activity: Not on file   Other Topics Concern  . Not on file   Social History Narrative  . No narrative on file    FAMILY HISTORY: Family History  Problem Relation Age of Onset  . Heart disease Mother   . Cancer Father     mesthelioma    ALLERGIES:  has No  Known Allergies.  MEDICATIONS:  Current Outpatient Prescriptions  Medication Sig Dispense Refill  . doxycycline (VIBRAMYCIN) 50 MG capsule Take 2 capsules (100 mg total) by mouth 2 (two) times daily.  40 capsule  0  . latanoprost (XALATAN) 0.005 % ophthalmic solution Place 1 drop into both eyes at bedtime.       Marland Kitchen losartan-hydrochlorothiazide (HYZAAR) 50-12.5 MG per tablet Take 1 tablet by mouth daily before breakfast.      . omeprazole (PRILOSEC OTC) 20 MG tablet Take 20 mg by mouth every morning.        No current facility-administered medications for this visit.    REVIEW OF SYSTEMS:   Constitutional: Denies fevers, chills or abnormal night sweats Eyes: Denies blurriness of vision, double vision or watery eyes Ears, nose, mouth, throat, and face: Denies mucositis or sore throat Respiratory: Denies cough, dyspnea or wheezes Cardiovascular: Denies palpitation, chest discomfort or lower extremity swelling Gastrointestinal:  Denies nausea, heartburn or change in bowel habits Lymphatics: Denies new lymphadenopathy or easy bruising Neurological:Denies numbness, tingling or new weaknesses Behavioral/Psych: Mood is stable, no new changes  All other systems were reviewed with the patient and are negative.  PHYSICAL EXAMINATION: ECOG PERFORMANCE STATUS: 1 - Symptomatic but completely ambulatory  Filed Vitals:   12/12/13 1100  BP: 147/85  Pulse: 95  Temp: 98.6 F (37 C)  Resp: 18   Filed Weights   12/12/13 1100  Weight: 232 lb 14.4 oz (105.643 kg)    GENERAL:alert, no distress and comfortable. He appeared to be morbidly obese SKIN: Careful examination of the skin revealed well-healed surgical scar. Multiple moles but they were not suspicious for malignancy  EYES: normal, conjunctiva are pink and non-injected, sclera clear OROPHARYNX:no exudate, no erythema and lips, buccal mucosa, and tongue normal  NECK: supple, thyroid normal size, non-tender, without nodularity LYMPH:  no  palpable lymphadenopathy in the cervical, axillary or inguinal LUNGS: clear to auscultation and percussion with normal breathing effort HEART: regular rate & rhythm and no murmurs and no lower extremity edema ABDOMEN:abdomen soft, non-tender and normal bowel sounds Musculoskeletal:no cyanosis of digits and no clubbing  PSYCH: alert & oriented x 3 with fluent speech NEURO: no focal motor/sensory deficits  LABORATORY DATA:  I have reviewed the data as listed Lab Results  Component Value Date   WBC 10.0 11/17/2013   HGB 15.6 11/17/2013   HCT 42.3 11/17/2013   MCV 85.6 11/17/2013   PLT 264 11/17/2013   Lab Results  Component Value Date   NA 141 11/17/2013   K 4.0 11/17/2013   CL 103 11/17/2013   CO2 23 11/17/2013   ASSESSMENT:  #1 malignant melanoma #2 history of prostate cancer  PLAN:  #1 malignant melanoma The patient is scheduled for lymph node dissection tomorrow. I plan to see him next week to review final pathology report. Given his advanced age, he will likely need staging PET/CT scan to be done in the near future. I reinforced the importance of avoidance of excessive sun exposure and to wear sunscreen  on a regular basis. #2 history of prostate cancer There is no evidence of disease recurrence. He will continue followup with his urologist with serial PSA monitoring #3 Wound dehiscence He will continue conservative management antibiotics under the care of his surgeon. All questions were answered. The patient knows to call the clinic with any problems, questions or concerns. I spent 40 minutes counseling the patient face to face. The total time spent in the appointment was 60 minutes and more than 50% was on counseling.     [facility], [place], MD 12/12/2013 4:34 PM

## 2013-12-12 NOTE — Telephone Encounter (Signed)
, °

## 2013-12-13 ENCOUNTER — Ambulatory Visit (HOSPITAL_BASED_OUTPATIENT_CLINIC_OR_DEPARTMENT_OTHER)
Admission: RE | Admit: 2013-12-13 | Discharge: 2013-12-13 | Disposition: A | Discharge status: Home / Self Care | Payer: BC Managed Care – PPO | Source: Ambulatory Visit | Attending: Surgery | Admitting: Surgery

## 2013-12-13 ENCOUNTER — Ambulatory Visit (HOSPITAL_BASED_OUTPATIENT_CLINIC_OR_DEPARTMENT_OTHER): Payer: BC Managed Care – PPO | Admitting: Certified Registered"

## 2013-12-13 ENCOUNTER — Encounter (HOSPITAL_BASED_OUTPATIENT_CLINIC_OR_DEPARTMENT_OTHER): Payer: BC Managed Care – PPO | Admitting: Certified Registered"

## 2013-12-13 ENCOUNTER — Encounter (HOSPITAL_BASED_OUTPATIENT_CLINIC_OR_DEPARTMENT_OTHER): Payer: Self-pay | Admitting: Certified Registered"

## 2013-12-13 ENCOUNTER — Encounter (HOSPITAL_BASED_OUTPATIENT_CLINIC_OR_DEPARTMENT_OTHER)
Admission: RE | Disposition: A | Discharge status: Home / Self Care | Payer: Self-pay | Source: Ambulatory Visit | Attending: Surgery

## 2013-12-13 DIAGNOSIS — C4359 Malignant melanoma of other part of trunk: Secondary | ICD-10-CM

## 2013-12-13 DIAGNOSIS — C773 Secondary and unspecified malignant neoplasm of axilla and upper limb lymph nodes: Secondary | ICD-10-CM | POA: Insufficient documentation

## 2013-12-13 DIAGNOSIS — I10 Essential (primary) hypertension: Secondary | ICD-10-CM | POA: Insufficient documentation

## 2013-12-13 DIAGNOSIS — K219 Gastro-esophageal reflux disease without esophagitis: Secondary | ICD-10-CM | POA: Insufficient documentation

## 2013-12-13 DIAGNOSIS — Z8546 Personal history of malignant neoplasm of prostate: Secondary | ICD-10-CM

## 2013-12-13 DIAGNOSIS — T8130XA Disruption of wound, unspecified, initial encounter: Secondary | ICD-10-CM

## 2013-12-13 HISTORY — DX: Malignant melanoma of skin, unspecified: C43.9

## 2013-12-13 HISTORY — PX: AXILLARY LYMPH NODE DISSECTION: SHX5229

## 2013-12-13 SURGERY — LYMPHADENECTOMY, AXILLARY
Anesthesia: General | Site: Axilla | Laterality: Right

## 2013-12-13 MED ORDER — BUPIVACAINE-EPINEPHRINE PF 0.5-1:200000 % IJ SOLN
INTRAMUSCULAR | Status: AC
  Filled 2013-12-13: qty 30

## 2013-12-13 MED ORDER — HYDROMORPHONE HCL PF 1 MG/ML IJ SOLN
0.2500 mg | INTRAMUSCULAR | Status: DC | PRN
  Administered 2013-12-13 (×3): 0.25 mg via INTRAVENOUS
  Administered 2013-12-13: 0.5 mg via INTRAVENOUS

## 2013-12-13 MED ORDER — BUPIVACAINE-EPINEPHRINE PF 0.25-1:200000 % IJ SOLN
INTRAMUSCULAR | Status: AC
  Filled 2013-12-13: qty 30

## 2013-12-13 MED ORDER — BUPIVACAINE-EPINEPHRINE 0.25% -1:200000 IJ SOLN
INTRAMUSCULAR | Status: DC | PRN
  Administered 2013-12-13: 10 mL

## 2013-12-13 MED ORDER — 0.9 % SODIUM CHLORIDE (POUR BTL) OPTIME
TOPICAL | Status: DC | PRN
  Administered 2013-12-13: 300 mL

## 2013-12-13 MED ORDER — LACTATED RINGERS IV SOLN
INTRAVENOUS | Status: DC
  Administered 2013-12-13 (×2): via INTRAVENOUS

## 2013-12-13 MED ORDER — OXYCODONE HCL 5 MG/5ML PO SOLN: 5.0000 mg | Freq: Once | ORAL | Status: AC | PRN

## 2013-12-13 MED ORDER — BACITRACIN-NEOMYCIN-POLYMYXIN OINTMENT TUBE
TOPICAL_OINTMENT | CUTANEOUS | Status: DC | PRN
  Administered 2013-12-13: 1 via TOPICAL

## 2013-12-13 MED ORDER — MIDAZOLAM HCL 2 MG/2ML IJ SOLN: 1.0000 mg | INTRAMUSCULAR | Status: DC | PRN

## 2013-12-13 MED ORDER — OXYCODONE HCL 5 MG PO TABS
ORAL_TABLET | ORAL | Status: AC
  Filled 2013-12-13: qty 1

## 2013-12-13 MED ORDER — BACITRACIN-NEOMYCIN-POLYMYXIN 400-5-5000 EX OINT
TOPICAL_OINTMENT | CUTANEOUS | Status: AC
  Filled 2013-12-13: qty 1

## 2013-12-13 MED ORDER — CHLORHEXIDINE GLUCONATE 4 % EX LIQD: 1.0000 "application " | Freq: Once | CUTANEOUS | Status: DC

## 2013-12-13 MED ORDER — MIDAZOLAM HCL 5 MG/5ML IJ SOLN
INTRAMUSCULAR | Status: DC | PRN
  Administered 2013-12-13: 1 mg via INTRAVENOUS

## 2013-12-13 MED ORDER — FENTANYL CITRATE 0.05 MG/ML IJ SOLN: 50.0000 ug | INTRAMUSCULAR | Status: DC | PRN

## 2013-12-13 MED ORDER — GLYCOPYRROLATE 0.2 MG/ML IJ SOLN
INTRAMUSCULAR | Status: DC | PRN
  Administered 2013-12-13: 0.2 mg via INTRAVENOUS

## 2013-12-13 MED ORDER — PROPOFOL 10 MG/ML IV BOLUS
INTRAVENOUS | Status: DC | PRN
  Administered 2013-12-13: 150 mg via INTRAVENOUS

## 2013-12-13 MED ORDER — FENTANYL CITRATE 0.05 MG/ML IJ SOLN
INTRAMUSCULAR | Status: AC
  Filled 2013-12-13: qty 6

## 2013-12-13 MED ORDER — DEXAMETHASONE SODIUM PHOSPHATE 4 MG/ML IJ SOLN
INTRAMUSCULAR | Status: DC | PRN
  Administered 2013-12-13: 10 mg via INTRAVENOUS

## 2013-12-13 MED ORDER — CEFAZOLIN SODIUM-DEXTROSE 2-3 GM-% IV SOLR
INTRAVENOUS | Status: AC
  Filled 2013-12-13: qty 50

## 2013-12-13 MED ORDER — LIDOCAINE HCL (CARDIAC) 20 MG/ML IV SOLN
INTRAVENOUS | Status: DC | PRN
  Administered 2013-12-13: 100 mg via INTRAVENOUS

## 2013-12-13 MED ORDER — MIDAZOLAM HCL 2 MG/2ML IJ SOLN
INTRAMUSCULAR | Status: AC
  Filled 2013-12-13: qty 2

## 2013-12-13 MED ORDER — OXYCODONE-ACETAMINOPHEN 5-325 MG PO TABS: 1.0000 | ORAL_TABLET | ORAL | Status: DC | PRN

## 2013-12-13 MED ORDER — ONDANSETRON HCL 4 MG/2ML IJ SOLN: 4.0000 mg | Freq: Once | INTRAMUSCULAR | Status: DC | PRN

## 2013-12-13 MED ORDER — CEFAZOLIN SODIUM-DEXTROSE 2-3 GM-% IV SOLR
2.0000 g | INTRAVENOUS | Status: AC
  Administered 2013-12-13: 2 g via INTRAVENOUS

## 2013-12-13 MED ORDER — FENTANYL CITRATE 0.05 MG/ML IJ SOLN
INTRAMUSCULAR | Status: DC | PRN
  Administered 2013-12-13: 50 ug via INTRAVENOUS
  Administered 2013-12-13: 100 ug via INTRAVENOUS

## 2013-12-13 MED ORDER — ONDANSETRON HCL 4 MG/2ML IJ SOLN
INTRAMUSCULAR | Status: DC | PRN
  Administered 2013-12-13: 4 mg via INTRAVENOUS

## 2013-12-13 MED ORDER — OXYCODONE HCL 5 MG PO TABS
5.0000 mg | ORAL_TABLET | Freq: Once | ORAL | Status: AC | PRN
  Administered 2013-12-13: 5 mg via ORAL

## 2013-12-13 MED ORDER — ATROPINE SULFATE 0.4 MG/ML IJ SOLN
INTRAMUSCULAR | Status: DC | PRN
  Administered 2013-12-13: 0.2 mg via INTRAVENOUS

## 2013-12-13 MED ORDER — HYDROMORPHONE HCL PF 1 MG/ML IJ SOLN
INTRAMUSCULAR | Status: AC
  Filled 2013-12-13: qty 1

## 2013-12-13 MED ORDER — MEPERIDINE HCL 25 MG/ML IJ SOLN: 6.2500 mg | INTRAMUSCULAR | Status: DC | PRN

## 2013-12-13 SURGICAL SUPPLY — 72 items
ADH SKN CLS APL DERMABOND .7 (GAUZE/BANDAGES/DRESSINGS) ×2
APL SKNCLS STERI-STRIP NONHPOA (GAUZE/BANDAGES/DRESSINGS)
APPLIER CLIP 11 MED OPEN (CLIP) ×2
APPLIER CLIP 9.375 MED OPEN (MISCELLANEOUS) ×2
APR CLP MED 11 20 MLT OPN (CLIP) ×1
APR CLP MED 9.3 20 MLT OPN (MISCELLANEOUS) ×1
BENZOIN TINCTURE PRP APPL 2/3 (GAUZE/BANDAGES/DRESSINGS) IMPLANT
BIOPATCH RED 1 DISK 7.0 (GAUZE/BANDAGES/DRESSINGS) ×1 IMPLANT
BLADE HEX COATED 2.75 (ELECTRODE) ×1 IMPLANT
BLADE SURG 15 STRL LF DISP TIS (BLADE) ×1 IMPLANT
BLADE SURG 15 STRL SS (BLADE) ×2
BLADE SURG ROTATE 9660 (MISCELLANEOUS) ×1 IMPLANT
CANISTER SUCT 1200ML W/VALVE (MISCELLANEOUS) ×2 IMPLANT
CHLORAPREP W/TINT 26ML (MISCELLANEOUS) ×2 IMPLANT
CLIP APPLIE 11 MED OPEN (CLIP) ×1 IMPLANT
CLIP APPLIE 9.375 MED OPEN (MISCELLANEOUS) IMPLANT
COVER MAYO STAND STRL (DRAPES) ×2 IMPLANT
COVER TABLE BACK 60X90 (DRAPES) ×2 IMPLANT
DECANTER SPIKE VIAL GLASS SM (MISCELLANEOUS) ×1 IMPLANT
DERMABOND ADVANCED (GAUZE/BANDAGES/DRESSINGS) ×2
DERMABOND ADVANCED .7 DNX12 (GAUZE/BANDAGES/DRESSINGS) ×1 IMPLANT
DRAIN CHANNEL 19F RND (DRAIN) ×2 IMPLANT
DRAIN HEMOVAC 1/8 X 5 (WOUND CARE) IMPLANT
DRAPE LAPAROTOMY TRNSV 102X78 (DRAPE) ×2 IMPLANT
DRAPE UTILITY XL STRL (DRAPES) ×2 IMPLANT
ELECT COATED BLADE 2.86 ST (ELECTRODE) ×1 IMPLANT
ELECT REM PT RETURN 9FT ADLT (ELECTROSURGICAL) ×2
ELECTRODE REM PT RTRN 9FT ADLT (ELECTROSURGICAL) ×1 IMPLANT
EVACUATOR SILICONE 100CC (DRAIN) ×2 IMPLANT
GLOVE BIO SURGEON STRL SZ 6.5 (GLOVE) ×1 IMPLANT
GLOVE BIOGEL PI IND STRL 7.0 (GLOVE) IMPLANT
GLOVE BIOGEL PI IND STRL 8 (GLOVE) ×1 IMPLANT
GLOVE BIOGEL PI INDICATOR 7.0 (GLOVE) ×3
GLOVE BIOGEL PI INDICATOR 8 (GLOVE) ×1
GLOVE ECLIPSE 6.5 STRL STRAW (GLOVE) ×1 IMPLANT
GLOVE ECLIPSE 8.0 STRL XLNG CF (GLOVE) ×2 IMPLANT
GOWN STRL REUS W/ TWL LRG LVL3 (GOWN DISPOSABLE) ×2 IMPLANT
GOWN STRL REUS W/ TWL XL LVL3 (GOWN DISPOSABLE) IMPLANT
GOWN STRL REUS W/TWL LRG LVL3 (GOWN DISPOSABLE) ×6
GOWN STRL REUS W/TWL XL LVL3 (GOWN DISPOSABLE)
HEMOSTAT SNOW SURGICEL 2X4 (HEMOSTASIS) ×1 IMPLANT
HEMOSTAT SURGICEL 2X14 (HEMOSTASIS) IMPLANT
NDL HYPO 25X1 1.5 SAFETY (NEEDLE) IMPLANT
NEEDLE HYPO 22GX1.5 SAFETY (NEEDLE) ×1 IMPLANT
NEEDLE HYPO 25X1 1.5 SAFETY (NEEDLE) ×2 IMPLANT
NS IRRIG 1000ML POUR BTL (IV SOLUTION) ×2 IMPLANT
PACK BASIN DAY SURGERY FS (CUSTOM PROCEDURE TRAY) ×2 IMPLANT
PENCIL BUTTON HOLSTER BLD 10FT (ELECTRODE) ×2 IMPLANT
PIN SAFETY STERILE (MISCELLANEOUS) ×1 IMPLANT
SLEEVE SCD COMPRESS KNEE MED (MISCELLANEOUS) ×2 IMPLANT
SPONGE GAUZE 4X4 12PLY (GAUZE/BANDAGES/DRESSINGS) ×1 IMPLANT
SPONGE INTESTINAL PEANUT (DISPOSABLE) IMPLANT
SPONGE LAP 4X18 X RAY DECT (DISPOSABLE) ×4 IMPLANT
STAPLER VISISTAT 35W (STAPLE) ×2 IMPLANT
STRIP CLOSURE SKIN 1/2X4 (GAUZE/BANDAGES/DRESSINGS) IMPLANT
SUT ETHILON 3 0 PS 1 (SUTURE) ×2 IMPLANT
SUT MON AB 4-0 PC3 18 (SUTURE) ×2 IMPLANT
SUT SILK 3 0 SH 30 (SUTURE) IMPLANT
SUT VIC AB 2-0 SH 27 (SUTURE)
SUT VIC AB 2-0 SH 27XBRD (SUTURE) IMPLANT
SUT VIC AB 3-0 FS2 27 (SUTURE) IMPLANT
SUT VIC AB 3-0 SH 27 (SUTURE)
SUT VIC AB 3-0 SH 27X BRD (SUTURE) IMPLANT
SUT VICRYL 3-0 CR8 SH (SUTURE) ×2 IMPLANT
SUT VICRYL 4-0 PS2 18IN ABS (SUTURE) IMPLANT
SUT VICRYL AB 2 0 TIE (SUTURE) IMPLANT
SUT VICRYL AB 2 0 TIES (SUTURE)
SYR CONTROL 10ML LL (SYRINGE) ×2 IMPLANT
TOWEL OR 17X24 6PK STRL BLUE (TOWEL DISPOSABLE) ×2 IMPLANT
TOWEL OR NON WOVEN STRL DISP B (DISPOSABLE) ×2 IMPLANT
TUBE CONNECTING 20X1/4 (TUBING) ×2 IMPLANT
YANKAUER SUCT BULB TIP NO VENT (SUCTIONS) ×2 IMPLANT

## 2013-12-13 NOTE — Op Note (Signed)
Preoperative diagnosis: Stage III melanoma right upper chest  Postoperative diagnosis: Same  Procedure: Right axillary lymph node dissection  Surgeon: Erroll Luna M.D.  Anesthesia: Gen. Endotracheal anesthesia with 0.25% Sensorcaine local with epinephrine  EBL: 70 cc  Specimen: Right axillary contents  Drains: 35 French  Indications for procedure: The patient presents for completion right axillary lymph node dissection after undergoing wide excision of the right upper chest superficial spreading melanoma and a positive sentinel node. Risks, benefits long term expectations discussed with patient and family. Risk of bleeding, infection, injury to veins, injury to artery, injury to nerve, lymphedema, shoulder weakness, and the need for other procedures discussed. He agreed to proceed.  Description of procedure: Patient met in holding area and questions answered. Right axilla marked. Wound over right clavicle examined. Sutures in place. Small gaps between sutures noted. No signs of infection no. Questions are answered. He is taken back to the operating room and placed supine. After induction of general endotracheal esthesia, right axilla and upper chest were prepped and draped in a sterile fashion. Timeout was done. He received 2 g of Ancef. The previous incision in right axilla opened. Small seroma encountered. Axillary contents were dissected away from the axillary vein, thoracodorsal trunk and long thoracic nerves respectively. These were all preserved. Small venous tributaries and lymphatics taken between clips. Contents removed and passed off the field. Hemostasis achieved and Surgicel snow placed in wound. Through separate stab incision and 19 French drain placed and secured with 3-0 nylon. Wound closed in layers with 3-0 Vicryl and 4-0 Monocryl. Dermabond applied. Drain placed to bulb suction. Upper chest wound dressed with Neosporin and dry dressing. All final counts sponge, needle and  instruments found to be correct this portion the case. Patient extubated awoke and taken to recovery in satisfactory condition.

## 2013-12-13 NOTE — Transfer of Care (Signed)
Immediate Anesthesia Transfer of Care Note  Patient: Mason Stewart  Procedure(s) Performed: Procedure(s): AXILLARY LYMPH NODE DISSECTION (Right)  Patient Location: PACU  Anesthesia Type:General  Level of Consciousness: awake, alert  and oriented  Airway & Oxygen Therapy: Patient Spontanous Breathing and Patient connected to face mask oxygen  Post-op Assessment: Report given to PACU RN, Post -op Vital signs reviewed and stable and Patient moving all extremities  Post vital signs: Reviewed and stable  Complications: No apparent anesthesia complications

## 2013-12-13 NOTE — Anesthesia Postprocedure Evaluation (Signed)
Anesthesia Post Note  Patient: Mason Stewart  Procedure(s) Performed: Procedure(s) (LRB): AXILLARY LYMPH NODE DISSECTION (Right)  Anesthesia type: general  Patient location: PACU  Post pain: Pain level controlled  Post assessment: Patient's Cardiovascular Status Stable  Last Vitals:  Filed Vitals:   12/13/13 1615  BP: 149/93  Pulse: 80  Temp:   Resp: 12    Post vital signs: Reviewed and stable  Level of consciousness: sedated  Complications: No apparent anesthesia complications

## 2013-12-13 NOTE — Discharge Instructions (Signed)
Bulb Drain Home Care °A bulb drain consists of a thin rubber tube and a soft, round bulb that creates a gentle suction. The rubber tube is placed in the area where you had surgery. A bulb is attached to the end of the tube that is outside the body. The bulb drain removes excess fluid that normally builds up in a surgical wound after surgery. The color and amount of fluid will vary. Immediately after surgery, the fluid is bright red and is a little thicker than water. It may gradually change to a yellow or pink color and become more thin and water-like. When the amount decreases to about 1 or 2 tbsp in 24 hours, your health care provider will usually remove it. °DAILY CARE °· Keep the bulb flat (compressed) at all times, except while emptying it. The flatness creates suction. You can flatten the bulb by squeezing it firmly in the middle and then closing the cap. °· Keep sites where the tube enters the skin dry and covered with a bandage (dressing). °· Secure the tube 1 2 in (2.5 5.1 cm) below the insertion sites, to keep it from pulling on your stitches. The tube is stitched in place and will not slip out. °· Secure the bulb as directed by your health care provider. °· For the first 3 days after surgery, there usually is more fluid in the bulb. Empty the bulb whenever it becomes half full because the bulb does not create enough suction if it is too full. The bulb could also overflow. Write down how much fluid you remove each time you empty your drain. Add up the amount removed in 24 hours. °· Empty the bulb at the same time every day once the amount of fluid decreases and you only need to empty it once a day. Write down the amounts and the 24-hour totals to give to your health care provider. This helps your health care provider know when the tubes can be removed. °EMPTYING THE BULB DRAIN °Before emptying the bulb, get a measuring cup, a piece of paper, and a pen and wash your hands. °· Gently run your fingers down  the tube (stripping) to empty any drainage from the tubing into the bulb. This may need to be done several times a day to clear the tubing of clots and tissue. °· Open the bulb cap to release suction, which causes it to inflate. Do not touch the inside of the cap. °· Gently run your fingers down the tube (stripping) to empty any drainage from the tubing into the bulb. °· Hold the cap out of the way, and pour fluid into the measuring cup.   °· Squeeze the bulb to provide suction.  °· Replace the cap.   °· Check the tape that holds the tube to your skin. If it is becoming loose, you can remove the loose piece of tape and apply a new one. Then, pin the bulb to your shirt.   °· Write down the amount of fluid you emptied out. Write down the date and each time you emptied your bulb drain. (If there are 2 bulbs, note the amount of drainage from each bulb and keep the totals separate. Your health care provider will want to know the total amounts for each drain and which tube is draining more.)   °· Flush the fluid down the toilet and wash your hands.   °· Call your health care provider once you have less than 2 tbsp of fluid collecting in the bulb drain every 24   hours. If there is drainage around the tube site, change dressings and keep the area dry. Cleanse around tube with sterile saline and place dry gauze around site. This gauze should be changed when it is soiled. If it stays clean and unsoiled, it should still be changed daily.  SEEK MEDICAL CARE IF:  Your drainage has a bad smell or is cloudy.   You have a fever.   Your drainage is increasing instead of decreasing.   Your tube fell out.   You have redness or swelling around the tube site.   You have drainage from a surgical wound.   Your bulb drain will not stay flat after you empty it.  MAKE SURE YOU:   Understand these instructions.  Will watch your condition.  Will get help right away if you are not doing well or get worse.  Post  Anesthesia Home Care Instructions  Activity: Get plenty of rest for the remainder of the day. A responsible adult should stay with you for 24 hours following the procedure.  For the next 24 hours, DO NOT: -Drive a car -Paediatric nurse -Drink alcoholic beverages -Take any medication unless instructed by your physician -Make any legal decisions or sign important papers.  Meals: Start with liquid foods such as gelatin or soup. Progress to regular foods as tolerated. Avoid greasy, spicy, heavy foods. If nausea and/or vomiting occur, drink only clear liquids until the nausea and/or vomiting subsides. Call your physician if vomiting continues.  Special Instructions/Symptoms: Your throat may feel dry or sore from the anesthesia or the breathing tube placed in your throat during surgery. If this causes discomfort, gargle with warm salt water. The discomfort should disappear within 24 hours.

## 2013-12-13 NOTE — Anesthesia Preprocedure Evaluation (Addendum)
Anesthesia Evaluation  Patient identified by MRN, date of birth, ID band Patient awake    Reviewed: Allergy & Precautions, H&P , NPO status , Patient's Chart, lab work & pertinent test results  Airway Mallampati: I TM Distance: >3 FB Neck ROM: Full    Dental   Pulmonary          Cardiovascular hypertension, Pt. on medications     Neuro/Psych    GI/Hepatic GERD-  Medicated and Controlled,  Endo/Other    Renal/GU      Musculoskeletal   Abdominal   Peds  Hematology   Anesthesia Other Findings   Reproductive/Obstetrics                           Anesthesia Physical Anesthesia Plan  ASA: II  Anesthesia Plan: General   Post-op Pain Management:    Induction: Intravenous  Airway Management Planned: LMA  Additional Equipment:   Intra-op Plan:   Post-operative Plan: Extubation in OR  Informed Consent: I have reviewed the patients History and Physical, chart, labs and discussed the procedure including the risks, benefits and alternatives for the proposed anesthesia with the patient or authorized representative who has indicated his/her understanding and acceptance.     Plan Discussed with: CRNA and Surgeon  Anesthesia Plan Comments:         Anesthesia Quick Evaluation  

## 2013-12-13 NOTE — H&P (View-Only) (Signed)
The patient returns 12 days after wide excision of upper chest melanoma with sentinel lymph node mapping. He is doing okay. Final pathology was reviewed today. He does have a positive sentinel node. Margins were negative.  Exam: Sutures noted to right upper chest wound. These removed. He went to put his shirt on and this area separated.  This was cleaned and closed again with 2 0 nylon.  Axilla clean and intact.   1. Lymph node, sentinel, biopsy, Right axillary #1 - METASTATIC MELANOMA IN ONE OF ONE LYMPH NODE (1/1). 2. Skin , Right chest - FOCAL RESIDUAL MALIGNANT MELANOMA - RESECTION MARGINS ARE NEGATIVE. - SEE ONCOLOGY TABLE. Microscopic Comment 2. MELANOMA TABLEType: Superficial spreading. Clark's Level: Residual is II, Original is IV Breslow's measurement (millimeters): Residual is 0.25 mm, Original is 1.82 mm. Host response: Residual is nonbrisk, Original is brisk. Regression: Present. Ulceration: Absent. Satellitosis: Absent. Margin: Negative. Comment: There is only a small focus of residual melanoma present in one of the sections. Therefore the primary tumor staging is unchanged from the shave biopsy ([id number]) and applicable data for both specimens is included above. However, there is metastatic melanoma in one of one lymph nodes (1/1) and thus, the final pathologic stage based on original biopsy and new lymph node is pT2a, pN1, pMx. Dr. Depick-Smith has reviewed the case. JULIA MANNY MD Pathologist, Electronic Signature (Case signed 11/28/2013) Specimen Gross and Clinical Information  Plan: Patient requires right axillary lymph node dissection. This is arranged for next week. Refer to oncology. Discussed risk of the dissection to the patient to include complications of bleeding, infection, lymphedema, shoulder stiffness, numbness around the incision, and the need for other potential procedures. He agreed to proceed. 

## 2013-12-13 NOTE — Interval H&P Note (Signed)
History and Physical Interval Note:  12/13/2013 1:34 PM  Mason Stewart  has presented today for surgery, with the diagnosis of melanoma   The various methods of treatment have been discussed with the patient and family. After consideration of risks, benefits and other options for treatment, the patient has consented to  Procedure(s): AXILLARY LYMPH NODE DISSECTION (Right) as a surgical intervention .  The patient's history has been reviewed, patient examined, no change in status, stable for surgery.  I have reviewed the patient's chart and labs.  Questions were answered to the patient's satisfaction.     Essie Gehret A.

## 2013-12-13 NOTE — Anesthesia Procedure Notes (Signed)
Procedure Name: LMA Insertion Date/Time: 12/13/2013 1:56 PM Performed by: Baxter Flattery Pre-anesthesia Checklist: Patient identified, Emergency Drugs available, Suction available and Patient being monitored Patient Re-evaluated:Patient Re-evaluated prior to inductionOxygen Delivery Method: Circle System Utilized Preoxygenation: Pre-oxygenation with 100% oxygen Intubation Type: IV induction Ventilation: Mask ventilation without difficulty LMA: LMA inserted LMA Size: 5.0 Number of attempts: 1 Airway Equipment and Method: bite block Placement Confirmation: positive ETCO2 and breath sounds checked- equal and bilateral Tube secured with: Tape Dental Injury: Teeth and Oropharynx as per pre-operative assessment

## 2013-12-14 ENCOUNTER — Encounter (HOSPITAL_BASED_OUTPATIENT_CLINIC_OR_DEPARTMENT_OTHER): Payer: Self-pay | Admitting: Surgery

## 2013-12-16 ENCOUNTER — Telehealth (INDEPENDENT_AMBULATORY_CARE_PROVIDER_SITE_OTHER): Payer: Self-pay | Admitting: *Deleted

## 2013-12-16 NOTE — Telephone Encounter (Signed)
Patient's wife called asking for pathology report.  Explained to wife that all lymph nodes came back as negative and it showed the other sample showed no malignancy.  Wife states understanding at this time.

## 2013-12-19 ENCOUNTER — Encounter (INDEPENDENT_AMBULATORY_CARE_PROVIDER_SITE_OTHER): Payer: Self-pay | Admitting: Surgery

## 2013-12-19 ENCOUNTER — Ambulatory Visit (INDEPENDENT_AMBULATORY_CARE_PROVIDER_SITE_OTHER): Payer: BC Managed Care – PPO | Admitting: Surgery

## 2013-12-19 VITALS — BP 124/74 | HR 80 | Temp 97.5°F | Ht 66.0 in | Wt 225.0 lb

## 2013-12-19 DIAGNOSIS — Z9889 Other specified postprocedural states: Secondary | ICD-10-CM

## 2013-12-19 NOTE — Progress Notes (Signed)
Patient returns after right axillary lymph node dissection for stage III melanoma superficial spreading. These done well. He is 6 days out. JP drainage is over 40 cc today.  Exam: Right chest wound with sutures in place improving. Right axillary wound is clean dry and intact with no signs of infection. JP draining serous sanguinous  Impression: Status post wide excision of right chest superficial spreading melanoma and completion right axillary lymph node dissection  Plan: Return next week for drain removal. Keep sutures in for 2 more weeks. To see oncology in the near future.

## 2013-12-19 NOTE — Patient Instructions (Signed)
Continue wound care.  Return 1 week to remove drain.

## 2013-12-23 ENCOUNTER — Telehealth: Payer: Self-pay | Admitting: Hematology and Oncology

## 2013-12-23 ENCOUNTER — Ambulatory Visit (HOSPITAL_BASED_OUTPATIENT_CLINIC_OR_DEPARTMENT_OTHER): Payer: BC Managed Care – PPO | Admitting: Hematology and Oncology

## 2013-12-23 VITALS — BP 148/89 | HR 76 | Temp 98.0°F | Resp 18 | Ht 66.0 in | Wt 229.4 lb

## 2013-12-23 DIAGNOSIS — C4359 Malignant melanoma of other part of trunk: Secondary | ICD-10-CM

## 2013-12-23 DIAGNOSIS — C61 Malignant neoplasm of prostate: Secondary | ICD-10-CM

## 2013-12-23 NOTE — Telephone Encounter (Signed)
gv and printed appt sched adna vs for pt for May °

## 2013-12-23 NOTE — Progress Notes (Signed)
[place] OFFICE PROGRESS NOTE  Patient Care Team: Irven Shelling, MD as PCP - General (Internal Medicine)  DIAGNOSIS: History of melanoma status post resection, for further management  SUMMARY OF ONCOLOGIC HISTORY: Oncology History   Melanoma of thoracic region   Primary site: Melanoma of the Skin (Right)   Staging method: AJCC 7th Edition   Clinical: Stage III (T2, N1, M0) signed by Heath Lark, MD on 12/23/2013  9:55 AM   Pathologic: (T2, N1, cM0) signed by Heath Lark, MD on 12/23/2013  9:56 AM   Summary: Stage III (T2, N1, cM0)  Also history of prostate cancer T2cN0M0 status post prostatectomy.     Melanoma of thoracic region   06/07/2012 Surgery The patient underwent prostate surgery which show Gleason 3+4 involving both lobes.   10/20/2013 Procedure The patient underwent biopsy to confirm superficial spreading melanoma, 1.82 mm thickness   11/23/2013 Surgery The patient underwent wide local excision and sentinel lymph node biopsy which showed 1 lymph node involvement.   12/13/2013 Surgery The patient underwent in complete lymph node dissection did show no evidence of disease.    INTERVAL HISTORY: Mason Stewart 63 y.o. male returns for further followup. He appears to be doing well with recent resection. He still has a drain in situ. He denies any wound dehiscence.  I have reviewed the past medical history, past surgical history, social history and family history with the patient and they are unchanged from previous note.  ALLERGIES:  has No Known Allergies.  MEDICATIONS:  Current Outpatient Prescriptions  Medication Sig Dispense Refill  . bimatoprost (LUMIGAN) 0.03 % ophthalmic solution Place 1 drop into both eyes at bedtime.      Marland Kitchen latanoprost (XALATAN) 0.005 % ophthalmic solution Place 1 drop into both eyes 2 (two) times daily.       Marland Kitchen losartan-hydrochlorothiazide (HYZAAR) 50-12.5 MG per tablet Take 1 tablet by mouth daily before breakfast.      .  omeprazole (PRILOSEC OTC) 20 MG tablet Take 20 mg by mouth every morning.       Marland Kitchen oxyCODONE-acetaminophen (ROXICET) 5-325 MG per tablet Take 1 tablet by mouth every 4 (four) hours as needed.  30 tablet  0   No current facility-administered medications for this visit.    REVIEW OF SYSTEMS:   Constitutional: Denies fevers, chills or abnormal weight loss All other systems were reviewed with the patient and are negative.  PHYSICAL EXAMINATION: ECOG PERFORMANCE STATUS: 1 - Symptomatic but completely ambulatory  Filed Vitals:   12/23/13 0927  BP: 148/89  Pulse: 76  Temp: 98 F (36.7 C)  Resp: 18   Filed Weights   12/23/13 0927  Weight: 229 lb 6.4 oz (104.055 kg)    GENERAL:alert, no distress and comfortable Musculoskeletal:no cyanosis of digits and no clubbing  NEURO: alert & oriented x 3 with fluent speech, no focal motor/sensory deficits  LABORATORY DATA:  I have reviewed the data as listed    Component Value Date/Time   NA 141 11/17/2013 1405   K 4.0 11/17/2013 1405   CL 103 11/17/2013 1405   CO2 23 11/17/2013 1405   GLUCOSE 156* 11/17/2013 1405   BUN 15 11/17/2013 1405   CREATININE 0.94 11/17/2013 1405   CALCIUM 8.7 11/17/2013 1405   PROT 6.9 11/17/2013 1405   ALBUMIN 3.9 11/17/2013 1405   AST 22 11/17/2013 1405   ALT 31 11/17/2013 1405   ALKPHOS 73 11/17/2013 1405   BILITOT 0.5 11/17/2013 1405   GFRNONAA 88*  11/17/2013 1405   GFRAA >90 11/17/2013 1405    No results found for this basename: SPEP, UPEP,  kappa and lambda light chains    Lab Results  Component Value Date   WBC 10.0 11/17/2013   NEUTROABS 6.9 11/17/2013   HGB 15.6 11/17/2013   HCT 42.3 11/17/2013   MCV 85.6 11/17/2013   PLT 264 11/17/2013      Chemistry      Component Value Date/Time   NA 141 11/17/2013 1405   K 4.0 11/17/2013 1405   CL 103 11/17/2013 1405   CO2 23 11/17/2013 1405   BUN 15 11/17/2013 1405   CREATININE 0.94 11/17/2013 1405      Component Value Date/Time   CALCIUM 8.7 11/17/2013 1405   ALKPHOS 73 11/17/2013 1405   AST 22  11/17/2013 1405   ALT 31 11/17/2013 1405   BILITOT 0.5 11/17/2013 1405      I reviewed the most recent pathology report from lymph node dissection which show no residual cancer  ASSESSMENT & PLAN:  #1 T2 N1MX malignant melanoma I recommend PET/CT scan to be done in about 2 months to restage him. He agreed. #2 history of prostate cancer T2, N0, M0 He will continue followup with urologist.  Orders Placed This Encounter  Procedures  . NM PET Image Initial (PI) Whole Body    Standing Status: Future     Number of Occurrences:      Standing Expiration Date: 02/22/2015    Order Specific Question:  Reason for Exam (SYMPTOM  OR DIAGNOSIS REQUIRED)    Answer:  melanoma staging    Order Specific Question:  Preferred imaging location?    Answer:  [facility]  . CBC with Differential    Standing Status: Future     Number of Occurrences:      Standing Expiration Date: 12/23/2014  . Comprehensive metabolic panel    Standing Status: Future     Number of Occurrences:      Standing Expiration Date: 12/23/2014  . Lactate dehydrogenase    Standing Status: Future     Number of Occurrences:      Standing Expiration Date: 12/23/2014   All questions were answered. The patient knows to call the clinic with any problems, questions or concerns. No barriers to learning was detected. I spent 15 minutes counseling the patient face to face. The total time spent in the appointment was 20 minutes and more than 50% was on counseling and review of test results     [facility], [place], MD 12/23/2013 9:58 AM

## 2013-12-27 ENCOUNTER — Encounter (INDEPENDENT_AMBULATORY_CARE_PROVIDER_SITE_OTHER): Payer: BC Managed Care – PPO

## 2013-12-27 ENCOUNTER — Telehealth (INDEPENDENT_AMBULATORY_CARE_PROVIDER_SITE_OTHER): Payer: Self-pay

## 2013-12-27 NOTE — Telephone Encounter (Signed)
Called pt back. Advised him to keep track of drainage. When he is under 30 cc for 2 days he can call me and I will find a time that I can pull his drain.

## 2013-12-27 NOTE — Telephone Encounter (Signed)
Pt's drainage has not measured less than 30cc's for 48 hrs.  He had an appointment with the nurse today.  Please call him to reschedule.

## 2013-12-30 ENCOUNTER — Ambulatory Visit (INDEPENDENT_AMBULATORY_CARE_PROVIDER_SITE_OTHER): Payer: BC Managed Care – PPO

## 2013-12-30 DIAGNOSIS — Z4803 Encounter for change or removal of drains: Secondary | ICD-10-CM

## 2013-12-30 DIAGNOSIS — Z4889 Encounter for other specified surgical aftercare: Secondary | ICD-10-CM

## 2013-12-30 NOTE — Progress Notes (Signed)
Pt comes in 17 days s/p right axilla lymph node dissection for drain pull. He has been draining under 30 cc for 4 days now. Stitch was snipped and drain was removed without difficulty. Covered with dry guaze. Pt will follow up next week.

## 2014-01-02 ENCOUNTER — Encounter (INDEPENDENT_AMBULATORY_CARE_PROVIDER_SITE_OTHER): Payer: Self-pay | Admitting: Surgery

## 2014-01-02 ENCOUNTER — Ambulatory Visit (INDEPENDENT_AMBULATORY_CARE_PROVIDER_SITE_OTHER): Payer: BC Managed Care – PPO | Admitting: Surgery

## 2014-01-02 VITALS — BP 150/86 | HR 82 | Temp 98.4°F | Ht 66.0 in | Wt 228.0 lb

## 2014-01-02 DIAGNOSIS — Z9889 Other specified postprocedural states: Secondary | ICD-10-CM

## 2014-01-02 NOTE — Patient Instructions (Signed)
Return 3 months.  Do not reach behind for 2 weeks.

## 2014-01-02 NOTE — Progress Notes (Signed)
Patient returns after right axillary lymph node dissection for stage III melanoma superficial spreading. He has  done well. He is 20 days out. JP is out  Exam: Right chest wound with sutures in place improving sutures removed. . Right axillary wound is clean dry and intact with no signs of infection  Lymph nodes, regional resection, Axilla, Right and contents - SIXTEEN LYMPH NODES, NEGATIVE FOR TUMOR (0/16) SEE COMMENT. - SEPARATELY SUBMITTED SOFT TISSUE WITH FAT NECROSIS AND FIBROINFLAMMATORY  Impression: Status post wide excision of right chest superficial spreading melanoma and completion right axillary lymph node dissection   Plan: return 3 months. Seeing oncology.

## 2014-02-20 ENCOUNTER — Encounter (HOSPITAL_COMMUNITY): Payer: Self-pay

## 2014-02-20 ENCOUNTER — Ambulatory Visit (HOSPITAL_COMMUNITY)
Admission: RE | Admit: 2014-02-20 | Discharge: 2014-02-20 | Disposition: A | Discharge status: Home / Self Care | Payer: BC Managed Care – PPO | Source: Ambulatory Visit | Attending: Hematology and Oncology | Admitting: Hematology and Oncology

## 2014-02-20 ENCOUNTER — Other Ambulatory Visit: Payer: BC Managed Care – PPO

## 2014-02-20 DIAGNOSIS — C4359 Malignant melanoma of other part of trunk: Secondary | ICD-10-CM

## 2014-02-20 DIAGNOSIS — C439 Malignant melanoma of skin, unspecified: Secondary | ICD-10-CM | POA: Insufficient documentation

## 2014-02-20 LAB — GLUCOSE, CAPILLARY: Glucose-Capillary: 151 mg/dL — ABNORMAL HIGH (ref 70–99)

## 2014-02-20 MED ORDER — FLUDEOXYGLUCOSE F - 18 (FDG) INJECTION
12.3000 | Freq: Once | INTRAVENOUS | Status: AC | PRN
  Administered 2014-02-20: 12.3 via INTRAVENOUS

## 2014-02-20 MED ORDER — FLUDEOXYGLUCOSE F - 18 (FDG) INJECTION
12.3000 | Freq: Once | INTRAVENOUS | Status: AC | PRN
Start: 2014-02-20 — End: 2014-02-20

## 2014-02-21 ENCOUNTER — Ambulatory Visit (HOSPITAL_BASED_OUTPATIENT_CLINIC_OR_DEPARTMENT_OTHER): Payer: BC Managed Care – PPO | Admitting: Hematology and Oncology

## 2014-02-21 ENCOUNTER — Encounter: Payer: Self-pay | Admitting: Hematology and Oncology

## 2014-02-21 VITALS — BP 128/73 | HR 81 | Temp 98.3°F | Resp 18 | Ht 66.0 in | Wt 228.3 lb

## 2014-02-21 DIAGNOSIS — Z8546 Personal history of malignant neoplasm of prostate: Secondary | ICD-10-CM

## 2014-02-21 DIAGNOSIS — C4359 Malignant melanoma of other part of trunk: Secondary | ICD-10-CM

## 2014-02-21 NOTE — Progress Notes (Signed)
[place] OFFICE PROGRESS NOTE  Patient Care Team: Irven Shelling, MD as PCP - General (Internal Medicine)  DIAGNOSIS: Stage III melanoma, no evidence of disease  SUMMARY OF ONCOLOGIC HISTORY: Oncology History   Melanoma of thoracic region   Primary site: Melanoma of the Skin (Right)   Staging method: AJCC 7th Edition   Clinical: Stage III (T2, N1, M0) signed by Heath Lark, MD on 12/23/2013  9:55 AM   Pathologic: (T2, N1, cM0) signed by Heath Lark, MD on 12/23/2013  9:56 AM   Summary: Stage III (T2, N1, cM0)  Also history of prostate cancer T2cN0M0 status post prostatectomy.     Melanoma of thoracic region   06/07/2012 Surgery The patient underwent prostate surgery which show Gleason 3+4 involving both lobes.   10/20/2013 Procedure The patient underwent biopsy to confirm superficial spreading melanoma, 1.82 mm thickness   11/23/2013 Surgery The patient underwent wide local excision and sentinel lymph node biopsy which showed 1 lymph node involvement.   12/13/2013 Surgery The patient underwent in complete lymph node dissection did show no evidence of disease.   02/20/2014 Imaging Staging PET CT scan show no evidence of disease recurrence.    INTERVAL HISTORY: Mason Stewart 63 y.o. male returns for further followup. His chest wall wound has resolved. Overall he is feeling well. The patient is aggressive with avoidance of excessive sun exposure. He wears sunscreen when he goes out. He denies new skin lesions.  I have reviewed the past medical history, past surgical history, social history and family history with the patient and they are unchanged from previous note.  ALLERGIES:  has No Known Allergies.  MEDICATIONS:  Current Outpatient Prescriptions  Medication Sig Dispense Refill  . bimatoprost (LUMIGAN) 0.03 % ophthalmic solution Place 1 drop into both eyes at bedtime.      Marland Kitchen latanoprost (XALATAN) 0.005 % ophthalmic solution Place 1 drop into both eyes 2 (two)  times daily.       Marland Kitchen losartan-hydrochlorothiazide (HYZAAR) 50-12.5 MG per tablet Take 1 tablet by mouth daily before breakfast.      . omeprazole (PRILOSEC OTC) 20 MG tablet Take 20 mg by mouth every morning.        No current facility-administered medications for this visit.    REVIEW OF SYSTEMS:   Constitutional: Denies fevers, chills or abnormal weight loss Eyes: Denies blurriness of vision Ears, nose, mouth, throat, and face: Denies mucositis or sore throat Respiratory: Denies cough, dyspnea or wheezes Cardiovascular: Denies palpitation, chest discomfort or lower extremity swelling Gastrointestinal:  Denies nausea, heartburn or change in bowel habits Skin: Denies abnormal skin rashes Lymphatics: Denies new lymphadenopathy or easy bruising Neurological:Denies numbness, tingling or new weaknesses Behavioral/Psych: Mood is stable, no new changes  All other systems were reviewed with the patient and are negative.  PHYSICAL EXAMINATION: ECOG PERFORMANCE STATUS: 0 - Asymptomatic  Filed Vitals:   02/21/14 1528  BP: 128/73  Pulse: 81  Temp: 98.3 F (36.8 C)  Resp: 18   Filed Weights   02/21/14 1528  Weight: 228 lb 4.8 oz (103.556 kg)    GENERAL:alert, no distress and comfortable. The patient is obese EYES: normal, Conjunctiva are pink and non-injected, sclera clear Musculoskeletal:no cyanosis of digits and no clubbing  NEURO: alert & oriented x 3 with fluent speech, no focal motor/sensory deficits  LABORATORY DATA:  I have reviewed the data as listed    Component Value Date/Time   NA 141 11/17/2013 1405   K 4.0 11/17/2013  1405   CL 103 11/17/2013 1405   CO2 23 11/17/2013 1405   GLUCOSE 156* 11/17/2013 1405   BUN 15 11/17/2013 1405   CREATININE 0.94 11/17/2013 1405   CALCIUM 8.7 11/17/2013 1405   PROT 6.9 11/17/2013 1405   ALBUMIN 3.9 11/17/2013 1405   AST 22 11/17/2013 1405   ALT 31 11/17/2013 1405   ALKPHOS 73 11/17/2013 1405   BILITOT 0.5 11/17/2013 1405   GFRNONAA 88* 11/17/2013 1405    GFRAA >90 11/17/2013 1405    No results found for this basename: SPEP,  UPEP,   kappa and lambda light chains    Lab Results  Component Value Date   WBC 10.0 11/17/2013   NEUTROABS 6.9 11/17/2013   HGB 15.6 11/17/2013   HCT 42.3 11/17/2013   MCV 85.6 11/17/2013   PLT 264 11/17/2013      Chemistry      Component Value Date/Time   NA 141 11/17/2013 1405   K 4.0 11/17/2013 1405   CL 103 11/17/2013 1405   CO2 23 11/17/2013 1405   BUN 15 11/17/2013 1405   CREATININE 0.94 11/17/2013 1405      Component Value Date/Time   CALCIUM 8.7 11/17/2013 1405   ALKPHOS 73 11/17/2013 1405   AST 22 11/17/2013 1405   ALT 31 11/17/2013 1405   BILITOT 0.5 11/17/2013 1405       RADIOGRAPHIC STUDIES: I reviewed the scan with the patient and his wife I have personally reviewed the radiological images as listed and agreed with the findings in the report. Nm Pet Image Initial (pi) Whole Body  02/20/2014   CLINICAL DATA:  Initial treatment strategy for melanoma.  EXAM: NUCLEAR MEDICINE PET WHOLE BODY  TECHNIQUE: 12.3 mCi F-18 FDG was injected intravenously. Full-ring PET imaging was performed from the vertex to the feet after the radiotracer. CT data was obtained and used for attenuation correction and anatomic localization.  FASTING BLOOD GLUCOSE:  Value: 151mg /dl  COMPARISON:  None.  FINDINGS: Head/Neck: No hypermetabolic lymph nodes in the neck.  Chest: No hypermetabolic mediastinal or hilar nodes. No suspicious pulmonary nodules on the CT scan. Skin contamination activity noted in the region of the left elbow, which is shown to have resolved on subsequent planar imaging after cleaning of this site.  Abdomen/Pelvis: No abnormal hypermetabolic activity within the liver, pancreas, adrenal glands, or spleen. No hypermetabolic lymph nodes in the abdomen or pelvis.  Skeleton: No focal hypermetabolic activity to suggest skeletal metastasis.  Extremities: No hypermetabolic activity to suggest metastasis.  IMPRESSION: Negative.  No residual  hypermetabolic disease identified.   Electronically Signed   By: Earle Gell M.D.   On: 02/20/2014 13:16      ASSESSMENT & PLAN:  #1 T2 N1M0 malignant melanoma I reviewed the current cancer guidelines regarding adjuvant treatment for stage III disease. I do not recommend putting him through adjuvant interferon treatment. I recommend history, physical examination and blood work with me every 6 months and for him to followup with his dermatology every 6 months. I would like our appointments to be spaced apart so that every 3 months he is seeing one of [place]. I continue to reinforce the importance of avoiding excessive sun exposure and wears sunscreen. Recommend vitamin D supplements due to risk of vitamin D deficiency.  #2 history of prostate cancer T2, N0, M0 He will continue followup with urologist.  Orders Placed This Encounter  Procedures  . CBC with Differential    Standing Status: Future  Number of Occurrences:      Standing Expiration Date: 02/21/2015  . Comprehensive metabolic panel    Standing Status: Future     Number of Occurrences:      Standing Expiration Date: 02/21/2015  . Lactate dehydrogenase    Standing Status: Future     Number of Occurrences:      Standing Expiration Date: 02/21/2015   All questions were answered. The patient knows to call the clinic with any problems, questions or concerns. No barriers to learning was detected. I spent 25 minutes counseling the patient face to face. The total time spent in the appointment was 30 minutes and more than 50% was on counseling and review of test results     Heath Lark, MD 02/21/2014 8:20 PM

## 2014-03-27 ENCOUNTER — Encounter (INDEPENDENT_AMBULATORY_CARE_PROVIDER_SITE_OTHER): Payer: Self-pay | Admitting: Surgery

## 2014-03-27 ENCOUNTER — Ambulatory Visit (INDEPENDENT_AMBULATORY_CARE_PROVIDER_SITE_OTHER): Payer: BC Managed Care – PPO | Admitting: Surgery

## 2014-03-27 VITALS — BP 130/80 | HR 74 | Resp 16 | Ht 66.0 in | Wt 222.4 lb

## 2014-03-27 DIAGNOSIS — Z85820 Personal history of malignant melanoma of skin: Secondary | ICD-10-CM

## 2014-03-27 NOTE — Progress Notes (Signed)
Subjective:     Patient ID: Mason Stewart, male   DOB: 08/19/1951, 63 y.o.   MRN: [id number]  HPI Patient returns for three-month followup after wide excision right upper chest stage III melanoma. He had subsequent right axillary lymph node dissection. He developed right arm swelling. It is not disabling. Some days better than others. No significant pain.   Review of Systems  Cardiovascular: Negative.   Gastrointestinal: Negative.        Objective:   Physical Exam  Constitutional: He is oriented to person, place, and time. He appears well-developed and well-nourished.  Musculoskeletal: Normal range of motion.  Lymphadenopathy:  Right arm 1 Plus lymphedema hand to shoulder  Neurological: He is alert and oriented to person, place, and time.  Skin: Skin is warm and dry.     Psychiatric: He has a normal mood and affect. His behavior is normal. Judgment and thought content normal.       Assessment:     Stage III right chest melanoma status post wide excision and axillary lymph node dissection  Right arm lymphedema    Plan:     Continue followup with oncology. No adjuvant therapy recommended. Referral to lymphedema clinic. Return 3 months.

## 2014-03-27 NOTE — Patient Instructions (Signed)
Return 3 months.  Will refer to lymphedema clinic  For arm swelling.

## 2014-03-29 ENCOUNTER — Other Ambulatory Visit (INDEPENDENT_AMBULATORY_CARE_PROVIDER_SITE_OTHER): Payer: Self-pay

## 2014-03-29 DIAGNOSIS — I890 Lymphedema, not elsewhere classified: Secondary | ICD-10-CM

## 2014-04-25 ENCOUNTER — Other Ambulatory Visit (INDEPENDENT_AMBULATORY_CARE_PROVIDER_SITE_OTHER): Payer: Self-pay | Admitting: *Deleted

## 2014-07-05 ENCOUNTER — Other Ambulatory Visit: Payer: Self-pay | Admitting: Dermatology

## 2014-08-24 ENCOUNTER — Ambulatory Visit (HOSPITAL_BASED_OUTPATIENT_CLINIC_OR_DEPARTMENT_OTHER): Payer: BC Managed Care – PPO | Admitting: Hematology and Oncology

## 2014-08-24 ENCOUNTER — Encounter: Payer: Self-pay | Admitting: Hematology and Oncology

## 2014-08-24 ENCOUNTER — Other Ambulatory Visit (HOSPITAL_BASED_OUTPATIENT_CLINIC_OR_DEPARTMENT_OTHER): Payer: BC Managed Care – PPO

## 2014-08-24 ENCOUNTER — Telehealth: Payer: Self-pay | Admitting: Hematology and Oncology

## 2014-08-24 VITALS — BP 147/68 | HR 86 | Temp 98.6°F | Resp 20 | Ht 66.0 in | Wt 222.0 lb

## 2014-08-24 DIAGNOSIS — C4359 Malignant melanoma of other part of trunk: Secondary | ICD-10-CM

## 2014-08-24 LAB — COMPREHENSIVE METABOLIC PANEL (CC13)
ALT: 25 U/L (ref 0–55)
AST: 21 U/L (ref 5–34)
Albumin: 4.1 g/dL (ref 3.5–5.0)
Alkaline Phosphatase: 75 U/L (ref 40–150)
Anion Gap: 8 mEq/L (ref 3–11)
BUN: 12.7 mg/dL (ref 7.0–26.0)
CO2: 24 mEq/L (ref 22–29)
Calcium: 9.2 mg/dL (ref 8.4–10.4)
Chloride: 106 mEq/L (ref 98–109)
Creatinine: 1.2 mg/dL (ref 0.7–1.3)
Glucose: 175 mg/dl — ABNORMAL HIGH (ref 70–140)
Potassium: 3.6 mEq/L (ref 3.5–5.1)
Sodium: 139 mEq/L (ref 136–145)
Total Bilirubin: 0.87 mg/dL (ref 0.20–1.20)
Total Protein: 6.9 g/dL (ref 6.4–8.3)

## 2014-08-24 LAB — CBC WITH DIFFERENTIAL/PLATELET
BASO%: 0.2 % (ref 0.0–2.0)
Basophils Absolute: 0 10*3/uL (ref 0.0–0.1)
EOS%: 2.1 % (ref 0.0–7.0)
Eosinophils Absolute: 0.2 10*3/uL (ref 0.0–0.5)
HCT: 41.2 % (ref 38.4–49.9)
HGB: 14.9 g/dL (ref 13.0–17.1)
LYMPH%: 13.5 % — ABNORMAL LOW (ref 14.0–49.0)
MCH: 31 pg (ref 27.2–33.4)
MCHC: 36.2 g/dL — ABNORMAL HIGH (ref 32.0–36.0)
MCV: 85.7 fL (ref 79.3–98.0)
MONO#: 0.6 10*3/uL (ref 0.1–0.9)
MONO%: 6.6 % (ref 0.0–14.0)
NEUT#: 7.3 10*3/uL — ABNORMAL HIGH (ref 1.5–6.5)
NEUT%: 77.6 % — ABNORMAL HIGH (ref 39.0–75.0)
Platelets: 235 10*3/uL (ref 140–400)
RBC: 4.81 10*6/uL (ref 4.20–5.82)
RDW: 12.4 % (ref 11.0–14.6)
WBC: 9.4 10*3/uL (ref 4.0–10.3)
lymph#: 1.3 10*3/uL (ref 0.9–3.3)

## 2014-08-24 LAB — LACTATE DEHYDROGENASE (CC13): LDH: 189 U/L (ref 125–245)

## 2014-08-24 NOTE — Assessment & Plan Note (Signed)
I reviewed with the patient recent FDA approval for stage III melanoma. The safety and effectiveness of Yervoy as adjuvant therapy is based on results from EORTC 91505, a randomized, double blind trial that was conducted in 951 high-risk patients with stage III melanoma who had undergone a complete lymph-node dissection. At 26 months, recurrence-free survival was significantly higher in the ipilimumab group compared with the placebo group, at 1 year (63.5% vs 56.1%), at 2 years (51.5% vs 43.8%), and at 3 years (46.5% vs 34.8%). Patients in the ipilimumab group were 25% less likely to experience melanoma recurrence than those in the placebo group (hazard ratio, 0.75; 95% confidence interval, 0.64 - 0.90; P = .0013). Median recurrence-free survival was also better in the ipilimumab group (26.1 vs 17.1 months). A total of 49% of participants taking ipilimumab had a recurrence after an average of 26 months compared with 62% percent of those receiving a placebo. The analysis of overall survival data has not yet occurred. The most common side effects reported in this study were rash, diarrhea, fatigue, itching, headache, weight loss, and nausea. Adverse events led to discontinuation of treatment in 245 (52%) of 471 patients who started ipilimumab, including 182 [39%] during the initial treatment period of four doses. In addition, five patients (1%) died due to drug-related adverse events. I also reviewed the most current NCCN guidelines with him regarding surveillance program. He agreed to undergo staging PET/CT scan and will consider further adjuvant Ipilimumab.

## 2014-08-24 NOTE — Telephone Encounter (Signed)
Pt confirmed labs/ov per 11/12 POF, gave pt AVS...... KJ °

## 2014-08-24 NOTE — Progress Notes (Signed)
[place] OFFICE PROGRESS NOTE  Patient Care Team: Irven Shelling, MD as PCP - General (Internal Medicine)  SUMMARY OF ONCOLOGIC HISTORY: Oncology History   Melanoma of thoracic region   Primary site: Melanoma of the Skin (Right)   Staging method: AJCC 7th Edition   Clinical: Stage III (T2, N1, M0) signed by Heath Lark, MD on 12/23/2013  9:55 AM   Pathologic: (T2, N1, cM0) signed by Heath Lark, MD on 12/23/2013  9:56 AM   Summary: Stage III (T2, N1, cM0)  Also history of prostate cancer T2cN0M0 status post prostatectomy.     Melanoma of thoracic region   06/07/2012 Surgery The patient underwent prostate surgery which show Gleason 3+4 involving both lobes.   10/20/2013 Procedure The patient underwent biopsy to confirm superficial spreading melanoma, 1.82 mm thickness   11/23/2013 Surgery The patient underwent wide local excision and sentinel lymph node biopsy which showed 1 lymph node involvement.   12/13/2013 Surgery The patient underwent in complete lymph node dissection did show no evidence of disease.   02/20/2014 Imaging Staging PET CT scan show no evidence of disease recurrence.    INTERVAL HISTORY: Please see below for problem oriented charting. He feels well. He had recent skin biopsies by his dermatologist which came back benign.  REVIEW OF SYSTEMS:   Constitutional: Denies fevers, chills or abnormal weight loss Eyes: Denies blurriness of vision Ears, nose, mouth, throat, and face: Denies mucositis or sore throat Respiratory: Denies cough, dyspnea or wheezes Cardiovascular: Denies palpitation, chest discomfort or lower extremity swelling Gastrointestinal:  Denies nausea, heartburn or change in bowel habits Skin: Denies abnormal skin rashes Lymphatics: Denies new lymphadenopathy or easy bruising Neurological:Denies numbness, tingling or new weaknesses Behavioral/Psych: Mood is stable, no new changes  All other systems were reviewed with the patient and are  negative.  I have reviewed the past medical history, past surgical history, social history and family history with the patient and they are unchanged from previous note.  ALLERGIES:  has No Known Allergies.  MEDICATIONS:  Current Outpatient Prescriptions  Medication Sig Dispense Refill  . bimatoprost (LUMIGAN) 0.03 % ophthalmic solution Place 1 drop into both eyes at bedtime.    Marland Kitchen latanoprost (XALATAN) 0.005 % ophthalmic solution Place 1 drop into both eyes 2 (two) times daily.     Marland Kitchen losartan-hydrochlorothiazide (HYZAAR) 50-12.5 MG per tablet Take 1 tablet by mouth daily before breakfast.    . omeprazole (PRILOSEC OTC) 20 MG tablet Take 20 mg by mouth every morning.      No current facility-administered medications for this visit.    PHYSICAL EXAMINATION: ECOG PERFORMANCE STATUS: 0 - Asymptomatic  Filed Vitals:   08/24/14 1436  BP: 147/68  Pulse: 86  Temp: 98.6 F (37 C)  Resp: 20   Filed Weights   08/24/14 1436  Weight: 222 lb (100.699 kg)    GENERAL:alert, no distress and comfortable SKIN: skin color, texture, turgor are normal, no rashes or significant lesions. Well healed surgical scars. EYES: normal, Conjunctiva are pink and non-injected, sclera clear OROPHARYNX:no exudate, no erythema and lips, buccal mucosa, and tongue normal  NECK: supple, thyroid normal size, non-tender, without nodularity LYMPH:  no palpable lymphadenopathy in the cervical, axillary or inguinal LUNGS: clear to auscultation and percussion with normal breathing effort HEART: regular rate & rhythm and no murmurs and no lower extremity edema ABDOMEN:abdomen soft, non-tender and normal bowel sounds Musculoskeletal:no cyanosis of digits and no clubbing  NEURO: alert & oriented x 3 with fluent  speech, no focal motor/sensory deficits  LABORATORY DATA:  I have reviewed the data as listed    Component Value Date/Time   NA 139 08/24/2014 1422   NA 141 11/17/2013 1405   K 3.6 08/24/2014 1422   K 4.0  11/17/2013 1405   CL 103 11/17/2013 1405   CO2 24 08/24/2014 1422   CO2 23 11/17/2013 1405   GLUCOSE 175* 08/24/2014 1422   GLUCOSE 156* 11/17/2013 1405   BUN 12.7 08/24/2014 1422   BUN 15 11/17/2013 1405   CREATININE 1.2 08/24/2014 1422   CREATININE 0.94 11/17/2013 1405   CALCIUM 9.2 08/24/2014 1422   CALCIUM 8.7 11/17/2013 1405   PROT 6.9 08/24/2014 1422   PROT 6.9 11/17/2013 1405   ALBUMIN 4.1 08/24/2014 1422   ALBUMIN 3.9 11/17/2013 1405   AST 21 08/24/2014 1422   AST 22 11/17/2013 1405   ALT 25 08/24/2014 1422   ALT 31 11/17/2013 1405   ALKPHOS 75 08/24/2014 1422   ALKPHOS 73 11/17/2013 1405   BILITOT 0.87 08/24/2014 1422   BILITOT 0.5 11/17/2013 1405   GFRNONAA 88* 11/17/2013 1405   GFRAA >90 11/17/2013 1405    No results found for: SPEP, UPEP  Lab Results  Component Value Date   WBC 9.4 08/24/2014   NEUTROABS 7.3* 08/24/2014   HGB 14.9 08/24/2014   HCT 41.2 08/24/2014   MCV 85.7 08/24/2014   PLT 235 08/24/2014      Chemistry      Component Value Date/Time   NA 139 08/24/2014 1422   NA 141 11/17/2013 1405   K 3.6 08/24/2014 1422   K 4.0 11/17/2013 1405   CL 103 11/17/2013 1405   CO2 24 08/24/2014 1422   CO2 23 11/17/2013 1405   BUN 12.7 08/24/2014 1422   BUN 15 11/17/2013 1405   CREATININE 1.2 08/24/2014 1422   CREATININE 0.94 11/17/2013 1405      Component Value Date/Time   CALCIUM 9.2 08/24/2014 1422   CALCIUM 8.7 11/17/2013 1405   ALKPHOS 75 08/24/2014 1422   ALKPHOS 73 11/17/2013 1405   AST 21 08/24/2014 1422   AST 22 11/17/2013 1405   ALT 25 08/24/2014 1422   ALT 31 11/17/2013 1405   BILITOT 0.87 08/24/2014 1422   BILITOT 0.5 11/17/2013 1405      ASSESSMENT & PLAN:  Melanoma of thoracic region I reviewed with the patient recent FDA approval for stage III melanoma. The safety and effectiveness of Yervoy as adjuvant therapy is based on results from EORTC 44818, a randomized, double blind trial that was conducted in 951 high-risk  patients with stage III melanoma who had undergone a complete lymph-node dissection. At 26 months, recurrence-free survival was significantly higher in the ipilimumab group compared with the placebo group, at 1 year (63.5% vs 56.1%), at 2 years (51.5% vs 43.8%), and at 3 years (46.5% vs 34.8%). Patients in the ipilimumab group were 25% less likely to experience melanoma recurrence than those in the placebo group (hazard ratio, 0.75; 95% confidence interval, 0.64 - 0.90; P = .0013). Median recurrence-free survival was also better in the ipilimumab group (26.1 vs 17.1 months). A total of 49% of participants taking ipilimumab had a recurrence after an average of 26 months compared with 62% percent of those receiving a placebo. The analysis of overall survival data has not yet occurred. The most common side effects reported in this study were rash, diarrhea, fatigue, itching, headache, weight loss, and nausea. Adverse events led to discontinuation of treatment in  245 (52%) of 471 patients who started ipilimumab, including 182 [39%] during the initial treatment period of four doses. In addition, five patients (1%) died due to drug-related adverse events. I also reviewed the most current NCCN guidelines with him regarding surveillance program. He agreed to undergo staging PET/CT scan and will consider further adjuvant Ipilimumab.   Orders Placed This Encounter  Procedures  . NM PET Image Restage (PS) Whole Body    Standing Status: Future     Number of Occurrences:      Standing Expiration Date: 10/24/2015    Order Specific Question:  Reason for Exam (SYMPTOM  OR DIAGNOSIS REQUIRED)    Answer:  melanoma staging    Order Specific Question:  Preferred imaging location?    Answer:  [facility]  . CBC with Differential    Standing Status: Future     Number of Occurrences:      Standing Expiration Date: 09/28/2015  . Comprehensive metabolic panel    Standing Status: Future     Number of  Occurrences:      Standing Expiration Date: 09/28/2015  . Lactate dehydrogenase    Standing Status: Future     Number of Occurrences:      Standing Expiration Date: 09/28/2015   All questions were answered. The patient knows to call the clinic with any problems, questions or concerns. No barriers to learning was detected. I spent 25 minutes counseling the patient face to face. The total time spent in the appointment was 30 minutes and more than 50% was on counseling and review of test results     [facility], [place], MD 08/24/2014 8:14 PM

## 2014-09-01 ENCOUNTER — Ambulatory Visit (HOSPITAL_COMMUNITY): Payer: BC Managed Care – PPO

## 2014-09-05 ENCOUNTER — Telehealth: Payer: Self-pay | Admitting: Hematology and Oncology

## 2014-09-05 ENCOUNTER — Ambulatory Visit (HOSPITAL_COMMUNITY)
Admission: RE | Admit: 2014-09-05 | Discharge: 2014-09-05 | Disposition: A | Discharge status: Home / Self Care | Payer: BC Managed Care – PPO | Source: Ambulatory Visit | Attending: Hematology and Oncology | Admitting: Hematology and Oncology

## 2014-09-05 DIAGNOSIS — C4359 Malignant melanoma of other part of trunk: Secondary | ICD-10-CM | POA: Insufficient documentation

## 2014-09-05 DIAGNOSIS — C779 Secondary and unspecified malignant neoplasm of lymph node, unspecified: Secondary | ICD-10-CM | POA: Diagnosis present

## 2014-09-05 LAB — GLUCOSE, CAPILLARY: Glucose-Capillary: 100 mg/dL — ABNORMAL HIGH (ref 70–99)

## 2014-09-05 MED ORDER — FLUDEOXYGLUCOSE F - 18 (FDG) INJECTION
11.5200 | Freq: Once | INTRAVENOUS | Status: AC | PRN
  Administered 2014-09-05: 11.52 via INTRAVENOUS

## 2014-09-05 NOTE — Telephone Encounter (Signed)
Reviewed results of PET scan with patient. He will think about the role of adjuvant ipilimumab

## 2014-09-18 ENCOUNTER — Other Ambulatory Visit: Payer: Self-pay | Admitting: *Deleted

## 2014-09-18 ENCOUNTER — Telehealth: Payer: Self-pay | Admitting: *Deleted

## 2014-09-18 ENCOUNTER — Other Ambulatory Visit: Payer: Self-pay | Admitting: Hematology and Oncology

## 2014-09-18 NOTE — Telephone Encounter (Signed)
Pt states he wants to proceed with chemotherapy treatment as recommended by Dr. Alvy Bimler.

## 2014-09-18 NOTE — Telephone Encounter (Signed)
I placed orders for labs, chemo teaching, precert and chemo, see me to start 12/15.

## 2014-09-19 ENCOUNTER — Telehealth: Payer: Self-pay | Admitting: *Deleted

## 2014-09-19 ENCOUNTER — Telehealth: Payer: Self-pay | Admitting: Hematology and Oncology

## 2014-09-19 ENCOUNTER — Other Ambulatory Visit: Payer: Self-pay | Admitting: *Deleted

## 2014-09-19 NOTE — Telephone Encounter (Signed)
Per staff message and POF I have scheduled appts. Advised scheduler of appts. JMW  

## 2014-09-19 NOTE — Telephone Encounter (Signed)
lvm for pt regarding to Dec appts.Marland KitchenMarland KitchenMarland Kitchen

## 2014-09-19 NOTE — Telephone Encounter (Signed)
Received call from pt wanting to know if pt could avoid the chemo education scheduled for 09/21/14.  Pt stated he needed to work and " Not wasting time to go to chemo class ". Spoke with pt and informed pt that it is highly recommended that all new pts need to attend chemo class prior to chemo treatments.  Informed pt that he could send a responsible family member to sit in his place.  Pt wished to change chemo class to [facility] 09/25/14.  Onc Tx sent to scheduler. Pt's  Phone   [phone].

## 2014-09-21 ENCOUNTER — Other Ambulatory Visit: Payer: BC Managed Care – PPO

## 2014-09-22 ENCOUNTER — Other Ambulatory Visit: Payer: Self-pay | Admitting: Hematology and Oncology

## 2014-09-25 ENCOUNTER — Ambulatory Visit: Payer: BC Managed Care – PPO

## 2014-09-26 ENCOUNTER — Encounter: Payer: Self-pay | Admitting: Hematology and Oncology

## 2014-09-26 ENCOUNTER — Other Ambulatory Visit (HOSPITAL_BASED_OUTPATIENT_CLINIC_OR_DEPARTMENT_OTHER): Payer: BC Managed Care – PPO

## 2014-09-26 ENCOUNTER — Ambulatory Visit (HOSPITAL_BASED_OUTPATIENT_CLINIC_OR_DEPARTMENT_OTHER): Payer: BC Managed Care – PPO | Admitting: Hematology and Oncology

## 2014-09-26 ENCOUNTER — Ambulatory Visit: Payer: BC Managed Care – PPO

## 2014-09-26 ENCOUNTER — Other Ambulatory Visit: Payer: Self-pay | Admitting: Hematology and Oncology

## 2014-09-26 VITALS — BP 152/78 | HR 72 | Temp 97.5°F | Resp 20 | Ht 66.0 in | Wt 223.1 lb

## 2014-09-26 DIAGNOSIS — C4359 Malignant melanoma of other part of trunk: Secondary | ICD-10-CM

## 2014-09-26 LAB — CBC WITH DIFFERENTIAL/PLATELET
BASO%: 0.3 % (ref 0.0–2.0)
Basophils Absolute: 0 10*3/uL (ref 0.0–0.1)
EOS%: 2.9 % (ref 0.0–7.0)
Eosinophils Absolute: 0.2 10*3/uL (ref 0.0–0.5)
HCT: 43.2 % (ref 38.4–49.9)
HGB: 15.5 g/dL (ref 13.0–17.1)
LYMPH%: 14.1 % (ref 14.0–49.0)
MCH: 31.1 pg (ref 27.2–33.4)
MCHC: 35.9 g/dL (ref 32.0–36.0)
MCV: 86.6 fL (ref 79.3–98.0)
MONO#: 0.5 10*3/uL (ref 0.1–0.9)
MONO%: 6.8 % (ref 0.0–14.0)
NEUT#: 5.4 10*3/uL (ref 1.5–6.5)
NEUT%: 75.9 % — ABNORMAL HIGH (ref 39.0–75.0)
Platelets: 223 10*3/uL (ref 140–400)
RBC: 4.99 10*6/uL (ref 4.20–5.82)
RDW: 12.8 % (ref 11.0–14.6)
WBC: 7.2 10*3/uL (ref 4.0–10.3)
lymph#: 1 10*3/uL (ref 0.9–3.3)
nRBC: 0 % (ref 0–0)

## 2014-09-26 LAB — COMPREHENSIVE METABOLIC PANEL (CC13)
ALT: 28 U/L (ref 0–55)
AST: 19 U/L (ref 5–34)
Albumin: 3.8 g/dL (ref 3.5–5.0)
Alkaline Phosphatase: 69 U/L (ref 40–150)
Anion Gap: 12 mEq/L — ABNORMAL HIGH (ref 3–11)
BUN: 13.3 mg/dL (ref 7.0–26.0)
CO2: 22 mEq/L (ref 22–29)
Calcium: 9 mg/dL (ref 8.4–10.4)
Chloride: 106 mEq/L (ref 98–109)
Creatinine: 1 mg/dL (ref 0.7–1.3)
EGFR: 76 mL/min/{1.73_m2} — ABNORMAL LOW (ref >90)
Glucose: 197 mg/dl — ABNORMAL HIGH (ref 70–140)
Potassium: 3.9 mEq/L (ref 3.5–5.1)
Sodium: 139 mEq/L (ref 136–145)
Total Bilirubin: 0.92 mg/dL (ref 0.20–1.20)
Total Protein: 6.5 g/dL (ref 6.4–8.3)

## 2014-09-26 LAB — LACTATE DEHYDROGENASE (CC13): LDH: 170 U/L (ref 125–245)

## 2014-09-26 NOTE — Progress Notes (Signed)
[place] OFFICE PROGRESS NOTE  Patient Care Team: Irven Shelling, MD as PCP - General (Internal Medicine)  SUMMARY OF ONCOLOGIC HISTORY: Oncology History   Melanoma of thoracic region   Primary site: Melanoma of the Skin (Right)   Staging method: AJCC 7th Edition   Clinical: Stage III (T2, N1, M0) signed by Heath Lark, MD on 12/23/2013  9:55 AM   Pathologic: (T2, N1, cM0) signed by Heath Lark, MD on 12/23/2013  9:56 AM   Summary: Stage III (T2, N1, cM0)  Also history of prostate cancer T2cN0M0 status post prostatectomy.     Melanoma of thoracic region   06/07/2012 Surgery The patient underwent prostate surgery which show Gleason 3+4 involving both lobes.   10/20/2013 Procedure The patient underwent biopsy to confirm superficial spreading melanoma, 1.82 mm thickness   11/23/2013 Surgery The patient underwent wide local excision and sentinel lymph node biopsy which showed 1 lymph node involvement.   12/13/2013 Surgery The patient underwent in complete lymph node dissection did show no evidence of disease.   02/20/2014 Imaging Staging PET CT scan show no evidence of disease recurrence.    INTERVAL HISTORY: Please see below for problem oriented charting. He returns prior to cycle 1 of treatment. We have extensive discussion about rationale behind treatment options.  REVIEW OF SYSTEMS:   Constitutional: Denies fevers, chills or abnormal weight loss Eyes: Denies blurriness of vision Ears, nose, mouth, throat, and face: Denies mucositis or sore throat Respiratory: Denies cough, dyspnea or wheezes Cardiovascular: Denies palpitation, chest discomfort or lower extremity swelling Gastrointestinal:  Denies nausea, heartburn or change in bowel habits Skin: Denies abnormal skin rashes Lymphatics: Denies new lymphadenopathy or easy bruising Neurological:Denies numbness, tingling or new weaknesses Behavioral/Psych: Mood is stable, no new changes  All other systems were reviewed  with the patient and are negative.  I have reviewed the past medical history, past surgical history, social history and family history with the patient and they are unchanged from previous note.  ALLERGIES:  has No Known Allergies.  MEDICATIONS:  Current Outpatient Prescriptions  Medication Sig Dispense Refill  . bimatoprost (LUMIGAN) 0.03 % ophthalmic solution Place 1 drop into both eyes at bedtime.    . Brinzolamide-Brimonidine (SIMBRINZA) 1-0.2 % SUSP Apply to eye 2 (two) times daily.    Marland Kitchen losartan-hydrochlorothiazide (HYZAAR) 50-12.5 MG per tablet Take 1 tablet by mouth daily before breakfast.    . omeprazole (PRILOSEC OTC) 20 MG tablet Take 20 mg by mouth every morning.      No current facility-administered medications for this visit.    PHYSICAL EXAMINATION: ECOG PERFORMANCE STATUS: 0 - Asymptomatic  Filed Vitals:   09/26/14 0838  BP: 152/78  Pulse: 72  Temp: 97.5 F (36.4 C)  Resp: 20   Filed Weights   09/26/14 0838  Weight: 223 lb 1.6 oz (101.197 kg)    GENERAL:alert, no distress and comfortable SKIN: skin color, texture, turgor are normal, no rashes or significant lesions EYES: normal, Conjunctiva are pink and non-injected, sclera clear OROPHARYNX:no exudate, no erythema and lips, buccal mucosa, and tongue normal  Musculoskeletal:no cyanosis of digits and no clubbing  NEURO: alert & oriented x 3 with fluent speech, no focal motor/sensory deficits  LABORATORY DATA:  I have reviewed the data as listed    Component Value Date/Time   NA 139 09/26/2014 0832   NA 141 11/17/2013 1405   K 3.9 09/26/2014 0832   K 4.0 11/17/2013 1405   CL 103 11/17/2013 1405  CO2 22 09/26/2014 0832   CO2 23 11/17/2013 1405   GLUCOSE 197* 09/26/2014 0832   GLUCOSE 156* 11/17/2013 1405   BUN 13.3 09/26/2014 0832   BUN 15 11/17/2013 1405   CREATININE 1.0 09/26/2014 0832   CREATININE 0.94 11/17/2013 1405   CALCIUM 9.0 09/26/2014 0832   CALCIUM 8.7 11/17/2013 1405   PROT 6.5  09/26/2014 0832   PROT 6.9 11/17/2013 1405   ALBUMIN 3.8 09/26/2014 0832   ALBUMIN 3.9 11/17/2013 1405   AST 19 09/26/2014 0832   AST 22 11/17/2013 1405   ALT 28 09/26/2014 0832   ALT 31 11/17/2013 1405   ALKPHOS 69 09/26/2014 0832   ALKPHOS 73 11/17/2013 1405   BILITOT 0.92 09/26/2014 0832   BILITOT 0.5 11/17/2013 1405   GFRNONAA 88* 11/17/2013 1405   GFRAA >90 11/17/2013 1405    No results found for: SPEP, UPEP  Lab Results  Component Value Date   WBC 7.2 09/26/2014   NEUTROABS 5.4 09/26/2014   HGB 15.5 09/26/2014   HCT 43.2 09/26/2014   MCV 86.6 09/26/2014   PLT 223 09/26/2014      Chemistry      Component Value Date/Time   NA 139 09/26/2014 0832   NA 141 11/17/2013 1405   K 3.9 09/26/2014 0832   K 4.0 11/17/2013 1405   CL 103 11/17/2013 1405   CO2 22 09/26/2014 0832   CO2 23 11/17/2013 1405   BUN 13.3 09/26/2014 0832   BUN 15 11/17/2013 1405   CREATININE 1.0 09/26/2014 0832   CREATININE 0.94 11/17/2013 1405      Component Value Date/Time   CALCIUM 9.0 09/26/2014 0832   CALCIUM 8.7 11/17/2013 1405   ALKPHOS 69 09/26/2014 0832   ALKPHOS 73 11/17/2013 1405   AST 19 09/26/2014 0832   AST 22 11/17/2013 1405   ALT 28 09/26/2014 0832   ALT 31 11/17/2013 1405   BILITOT 0.92 09/26/2014 0832   BILITOT 0.5 11/17/2013 1405     ASSESSMENT & PLAN:  Melanoma of thoracic region We had extensive discussion today. The patient is concerned about financial implications as he is self-employed and was not able to get help from the financial navigator. After extensive discussion he agreed not to proceed with treatment today. I will refer him to other major cancer center to see if they have a clinical trial that he would be eligible to. Otherwise I will see him in 6 months with history, physical examination, blood work and PET CT scan.   Orders Placed This Encounter  Procedures  . NM PET Image Restag (PS) Skull Base To Thigh    Standing Status: Future     Number of  Occurrences:      Standing Expiration Date: 11/26/2015    Order Specific Question:  Reason for Exam (SYMPTOM  OR DIAGNOSIS REQUIRED)    Answer:  melanoma, exclude recurrence    Order Specific Question:  Preferred imaging location?    Answer:  [facility]  . CBC with Differential    Standing Status: Future     Number of Occurrences:      Standing Expiration Date: 11/26/2015  . Comprehensive metabolic panel    Standing Status: Future     Number of Occurrences:      Standing Expiration Date: 11/26/2015  . Lactate dehydrogenase    Standing Status: Future     Number of Occurrences:      Standing Expiration Date: 11/26/2015   All questions were answered. The patient knows  to call the clinic with any problems, questions or concerns. No barriers to learning was detected. I spent 30 minutes counseling the patient face to face. The total time spent in the appointment was 40 minutes and more than 50% was on counseling and review of test results     [facility], [place], MD 09/26/2014 10:45 AM

## 2014-09-26 NOTE — Assessment & Plan Note (Signed)
We had extensive discussion today. The patient is concerned about financial implications as he is self-employed and was not able to get help from the financial navigator. After extensive discussion he agreed not to proceed with treatment today. I will refer him to other major cancer center to see if they have a clinical trial that he would be eligible to. Otherwise I will see him in 6 months with history, physical examination, blood work and PET CT scan.

## 2014-09-27 ENCOUNTER — Encounter: Payer: Self-pay | Admitting: Hematology and Oncology

## 2014-09-27 NOTE — Progress Notes (Signed)
Per [place] the patient approved for asst with Yervoy 09/26/14-09/26/15 25,000. The patient is resp for 1st 25.00. I will let billing and medical records know.

## 2014-10-02 ENCOUNTER — Telehealth: Payer: Self-pay | Admitting: *Deleted

## 2014-10-02 NOTE — Telephone Encounter (Signed)
Pt not at home, so I informed wife that Dr. Alvy Bimler could not find Clinical Trial for patient.  Unfortunately his history of Prostate Cancer disqualifies him from Melanoma clinical trials.  She verbalized understanding.

## 2014-10-17 ENCOUNTER — Ambulatory Visit: Payer: BC Managed Care – PPO

## 2014-11-07 ENCOUNTER — Ambulatory Visit: Payer: BC Managed Care – PPO

## 2015-01-16 ENCOUNTER — Other Ambulatory Visit: Payer: Self-pay | Admitting: Dermatology

## 2015-02-20 ENCOUNTER — Telehealth: Payer: Self-pay | Admitting: Hematology and Oncology

## 2015-02-20 NOTE — Telephone Encounter (Signed)
returned call and s.w. pt and confirmed 5.12 appt cx.

## 2015-02-22 ENCOUNTER — Other Ambulatory Visit: Payer: BC Managed Care – PPO

## 2015-02-22 ENCOUNTER — Ambulatory Visit: Payer: BC Managed Care – PPO | Admitting: Hematology and Oncology

## 2015-03-06 ENCOUNTER — Ambulatory Visit (HOSPITAL_COMMUNITY): Payer: BC Managed Care – PPO

## 2016-01-04 DIAGNOSIS — N5201 Erectile dysfunction due to arterial insufficiency: Secondary | ICD-10-CM | POA: Diagnosis not present

## 2016-01-04 DIAGNOSIS — N393 Stress incontinence (female) (male): Secondary | ICD-10-CM | POA: Diagnosis not present

## 2016-01-04 DIAGNOSIS — Z0000 Encounter for general adult medical examination without abnormal findings: Secondary | ICD-10-CM | POA: Diagnosis not present

## 2016-01-04 DIAGNOSIS — C61 Malignant neoplasm of prostate: Secondary | ICD-10-CM | POA: Diagnosis not present

## 2016-01-08 DIAGNOSIS — H401131 Primary open-angle glaucoma, bilateral, mild stage: Secondary | ICD-10-CM | POA: Diagnosis not present

## 2016-01-10 DIAGNOSIS — Z0000 Encounter for general adult medical examination without abnormal findings: Secondary | ICD-10-CM | POA: Diagnosis not present

## 2016-01-10 DIAGNOSIS — Z8546 Personal history of malignant neoplasm of prostate: Secondary | ICD-10-CM | POA: Diagnosis not present

## 2016-01-10 DIAGNOSIS — Z1389 Encounter for screening for other disorder: Secondary | ICD-10-CM | POA: Diagnosis not present

## 2016-01-10 DIAGNOSIS — Z13220 Encounter for screening for lipoid disorders: Secondary | ICD-10-CM | POA: Diagnosis not present

## 2016-01-10 DIAGNOSIS — I10 Essential (primary) hypertension: Secondary | ICD-10-CM | POA: Diagnosis not present

## 2016-01-10 DIAGNOSIS — Z85820 Personal history of malignant melanoma of skin: Secondary | ICD-10-CM | POA: Diagnosis not present

## 2016-01-10 DIAGNOSIS — Z23 Encounter for immunization: Secondary | ICD-10-CM | POA: Diagnosis not present

## 2016-02-25 DIAGNOSIS — D2262 Melanocytic nevi of left upper limb, including shoulder: Secondary | ICD-10-CM | POA: Diagnosis not present

## 2016-02-25 DIAGNOSIS — L739 Follicular disorder, unspecified: Secondary | ICD-10-CM | POA: Diagnosis not present

## 2016-02-25 DIAGNOSIS — D2261 Melanocytic nevi of right upper limb, including shoulder: Secondary | ICD-10-CM | POA: Diagnosis not present

## 2016-02-25 DIAGNOSIS — D225 Melanocytic nevi of trunk: Secondary | ICD-10-CM | POA: Diagnosis not present

## 2016-02-25 DIAGNOSIS — D224 Melanocytic nevi of scalp and neck: Secondary | ICD-10-CM | POA: Diagnosis not present

## 2016-02-25 DIAGNOSIS — Z85820 Personal history of malignant melanoma of skin: Secondary | ICD-10-CM | POA: Diagnosis not present

## 2016-02-25 DIAGNOSIS — D1801 Hemangioma of skin and subcutaneous tissue: Secondary | ICD-10-CM | POA: Diagnosis not present

## 2016-02-25 DIAGNOSIS — L821 Other seborrheic keratosis: Secondary | ICD-10-CM | POA: Diagnosis not present

## 2016-02-25 DIAGNOSIS — D485 Neoplasm of uncertain behavior of skin: Secondary | ICD-10-CM | POA: Diagnosis not present

## 2016-02-25 DIAGNOSIS — L814 Other melanin hyperpigmentation: Secondary | ICD-10-CM | POA: Diagnosis not present

## 2016-02-25 DIAGNOSIS — D2272 Melanocytic nevi of left lower limb, including hip: Secondary | ICD-10-CM | POA: Diagnosis not present

## 2016-02-25 DIAGNOSIS — D2271 Melanocytic nevi of right lower limb, including hip: Secondary | ICD-10-CM | POA: Diagnosis not present

## 2016-05-06 DIAGNOSIS — H401131 Primary open-angle glaucoma, bilateral, mild stage: Secondary | ICD-10-CM | POA: Diagnosis not present

## 2016-08-04 IMAGING — CT NM PET IMAGE RESTAGE (PS) WHOLE BODY
1 of 7 series · 3 of 25 positions shown · non-contrast
Comparison: PET-CT scan 02/20/2014

CLINICAL DATA: Subsequent treatment strategy for melanoma. Lesion
in the right anterior chest wall with positive lymph node. Patient
status post local wide excision .

EXAM:
NUCLEAR MEDICINE PET WHOLE BODY
TECHNIQUE: Eleven . the mCi F-18 FDG was injected intravenously. Full-ring PET
imaging was performed from the vertex to the feet after the
radiotracer. CT data was obtained and used for attenuation
correction and anatomic localization.
FASTING BLOOD GLUCOSE:  Value: 100mg/dl

[Series 3: ct wb 5.0 hd_fov · axial · 5.0mm · 1.27mm/px · z∈[-388,+532]mm · 3 of 460 slices shown]
[im 115/460  soft-tissue]
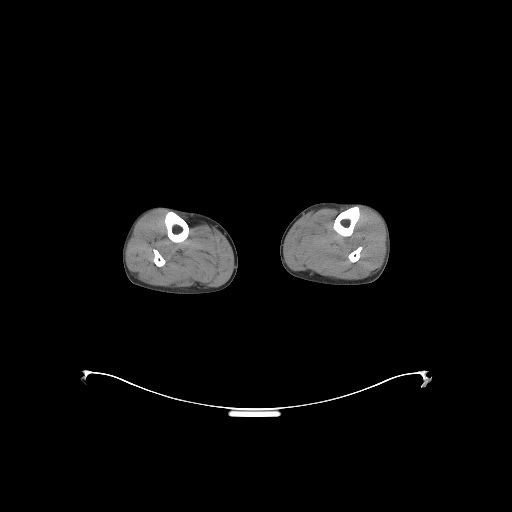
[im 230/460  soft-tissue]
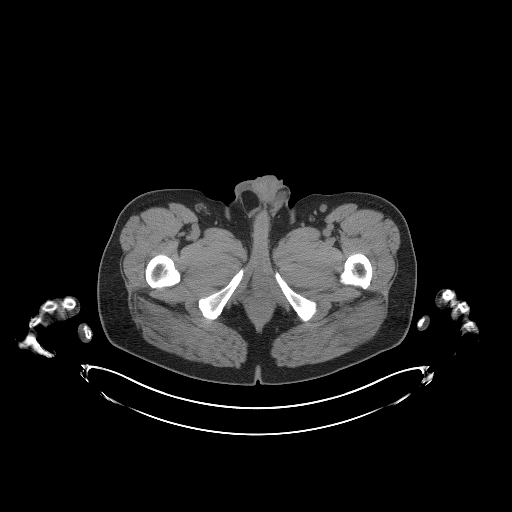
[im 345/460  soft-tissue]
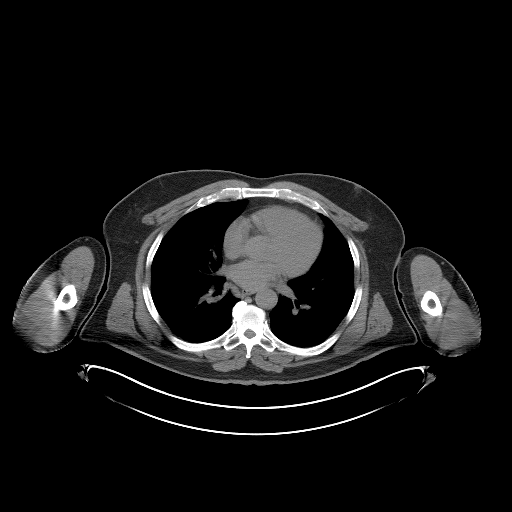

[3 of 25 positions shown; findings below may reference images not displayed]

FINDINGS: Head/Neck: No hypermetabolic lymph nodes in the neck. No
hypermetabolic activity scalp.

Chest: No hypermetabolic mediastinal or hilar nodes. No suspicious
pulmonary nodules on the CT scan. No chest wall abnormality.
Lymphadenectomy clips in the right axilla.

Abdomen/Pelvis: No abnormal hypermetabolic activity within the
liver, pancreas, adrenal glands, or spleen. No hypermetabolic lymph
nodes in the abdomen or pelvis. There is urine contamination
associated with the external genitalia.

Skeleton: No focal hypermetabolic activity to suggest skeletal
metastasis.

Extremities: No hypermetabolic activity to suggest metastasis.
IMPRESSION: 1. No evidence of local melanoma recurrence in the right chest.
2. No evidence of distant metastatic disease.

## 2016-09-02 DIAGNOSIS — H401131 Primary open-angle glaucoma, bilateral, mild stage: Secondary | ICD-10-CM | POA: Diagnosis not present

## 2017-01-06 DIAGNOSIS — H401131 Primary open-angle glaucoma, bilateral, mild stage: Secondary | ICD-10-CM | POA: Diagnosis not present

## 2017-01-12 DIAGNOSIS — I10 Essential (primary) hypertension: Secondary | ICD-10-CM | POA: Diagnosis not present

## 2017-01-12 DIAGNOSIS — Z1389 Encounter for screening for other disorder: Secondary | ICD-10-CM | POA: Diagnosis not present

## 2017-01-12 DIAGNOSIS — Z0000 Encounter for general adult medical examination without abnormal findings: Secondary | ICD-10-CM | POA: Diagnosis not present

## 2017-01-12 DIAGNOSIS — Z23 Encounter for immunization: Secondary | ICD-10-CM | POA: Diagnosis not present

## 2017-01-12 DIAGNOSIS — Z85820 Personal history of malignant melanoma of skin: Secondary | ICD-10-CM | POA: Diagnosis not present

## 2017-02-10 ENCOUNTER — Telehealth: Payer: Self-pay | Admitting: Oncology

## 2017-02-10 ENCOUNTER — Encounter: Payer: Self-pay | Admitting: Oncology

## 2017-02-10 NOTE — Telephone Encounter (Signed)
Received a call from Tammy to schedule the pt an appt w/Dr. Benay Spice on 5/22 at 2pm. Mr. Messina is a former pt of Dr. Alvy Bimler. Letter mailed to the pt

## 2017-03-03 ENCOUNTER — Ambulatory Visit (HOSPITAL_BASED_OUTPATIENT_CLINIC_OR_DEPARTMENT_OTHER): Payer: Medicare Other | Admitting: Oncology

## 2017-03-03 VITALS — BP 139/80 | HR 85 | Temp 98.1°F | Resp 18 | Ht 66.0 in | Wt 227.3 lb

## 2017-03-03 DIAGNOSIS — C4359 Malignant melanoma of other part of trunk: Secondary | ICD-10-CM

## 2017-03-03 DIAGNOSIS — C439 Malignant melanoma of skin, unspecified: Secondary | ICD-10-CM

## 2017-03-03 DIAGNOSIS — I10 Essential (primary) hypertension: Secondary | ICD-10-CM

## 2017-03-03 NOTE — Progress Notes (Signed)
Mason Stewart   Referring MD: Lavone Orn, Md [address] [place], [place] [address]   Mason Stewart 66 y.o.  1951-04-11    Reason for Referral: Melanoma   HPI: Mason Stewart was diagnosed with a stage III melanoma of the right anterior chest wall in January 2015. Initial biopsy revealed a 1.82 mm thick, level IV superficial spreading melanoma. He underwent a sentinel lymph node biopsy and reexcision procedure on 11/23/2013. The pathology revealed a residual 0.25 mm melanoma with regression present. No ulceration or satellitosis. The resection margin was negative. A right axillary sentinel lymph node returned with metastatic melanoma. A right axillary lymph node dissection 12/13/2013 revealed 16 negative lymph nodes.  He was referred to Dr. Alvy Bimler. A staging PET scan 02/20/2014 revealed no evidence for metastatic disease. A surveillance PET scan 09/05/2014 was also negative for metastatic disease.  Dr.Gorsuch saw him in follow-up in November 2015 and recommended adjuvant  Ipilumumab. Mason Stewart declined adjuvant therapy secondary to financial concerns.  He feels well. He is followed by Dr. Elvera Lennox. He is referred by Dr. Laurann Montana to consider the indication for adjuvant therapy.  Past Medical History:  Diagnosis Date  . Cancer 2013    prostate cancer  . GERD (gastroesophageal reflux disease)   . Glaucoma (increased eye pressure)   . History of a "esophageal ulcer "   . Hypertension   . Melanoma-Stage III (T2 N1)  February 2015   .    .    . Sleep apnea    stopbang =7-has not had a study  . Wound dehiscence 12/12/2013    Past Surgical History:  Procedure Laterality Date  . AXILLARY LYMPH NODE DISSECTION Right 12/13/2013   Procedure: AXILLARY LYMPH NODE DISSECTION;  Surgeon: Joyice Faster. Cornett, MD;  Location: [place];  Service: General;  Laterality: Right;  . [place]   lower back  . colonscopy   2012  . EXCISION MELANOMA WITH SENTINEL LYMPH NODE BIOPSY Right 11/23/2013   Procedure: EXCISION MELANOMA WITH SENTINEL LYMPH NODE BIOPSY;  Surgeon: Joyice Faster. Cornett, MD;  Location: [place];  Service: General;  Laterality: Right;  . left middle finger surgery after injury  yrs ago  . ROBOT ASSISTED LAPAROSCOPIC RADICAL PROSTATECTOMY  06/07/2012   Procedure: ROBOTIC ASSISTED LAPAROSCOPIC RADICAL PROSTATECTOMY LEVEL 2;  Surgeon: Dutch Gray, MD;  Location: [facility];  Service: Urology;  Laterality: N/A;        Medications: Reviewed  Allergies: No Known Allergies  Family history: His father died of peritoneal mesothelioma at age 2. He has 2 brothers and 2 sisters. 2 daughters. No other family history of cancer.  Social History:   He works as a Archivist. He lives in [place]. He does not use cigarettes. He reports rare alcohol use. No transfusion history. No risk factor for HIV or hepatitis.  ROS:   Positives include:Chronic shoulder discomfort following injuries, pain in the bilateral hip after prolonged walking, decreased visual acuity, urinary incontinence following the prostatectomy procedure  A complete ROS was otherwise negative.  Physical Exam:  Blood pressure 139/80, pulse 85, temperature 98.1 F (36.7 C), temperature source Oral, resp. rate 18, height '5\' 6"'  (1.676 m), weight 227 lb 4.8 oz (103.1 kg), SpO2 96 %.  HEENT: Oropharynx without visible mass lesion, neck without mass  Lungs: Clear bilaterally  Cardiac: Regular rate and rhythm  Abdomen: No hepatosplenomegaly, no mass, nontender  GU: There is enlargement  and firmness of the right compared to the left testicle. There is a 1.5 cm nodule superior to the right testicle  Vascular: No leg edema  Lymph nodes: No cervical, supraclavicular, axillary, or inguinal nodes  Neurologic: Alert and oriented, the motor exam appears intact in the upper and lower extremities  Skin: No rash. Multiple benign  appearing moles over the trunk and extremities. Right upper chest scar without evidence of recurrent tumor  Musculoskeletal: No spine tenderness     Assessment/Plan:  1. Stage IIIa (T2a, N1a)  1.82 mm superficial spreading melanoma , right anterior chest, sentinel lymph node biopsy positive for metastatic melanoma 11/23/2013  Axillary lymph node dissection 12/13/2013-no evidence of metastatic disease  Staging PET scan 02/20/2014-negative  Surveillance PET scan 09/05/2014-negative  2.   Prostate cancer, status post a prostatectomy 06/07/2012-followed by Dr. Alinda Money   3.   Glaucoma  4.   Gastroesophageal reflux disease  5.   Hypertension  6.  Right testicle enlargement noted on physical exam 03/03/2017  Disposition:   Mason Stewart was diagnosed with stage IIIa melanoma of the right anterior chest wall in 2015. He is now greater than 3 years out from diagnosis. He did not receive adjuvant therapy.  I explained the recent data confirming a disease-free and overall survival benefit with adjuvant immunotherapy and targeted therapies in patients with resected stage III melanoma. There is data supporting the use of PD1 inhibitors, a CTLA-4 inhibitor, and BRAF/MEK inhibition.   He is now greater than 3 years out from diagnosis. He has a better prognosis than the average stage III patient based on the initial tumor thickness and clinically occult lymph node metastasis. There is no indication for adjuvant therapy at present. He has a good prognosis for a long-term disease-free survival.  We do not have the  BRAF mutation status in his case, but I do not recommend checking this unless he develops progressive metastatic disease.   He will continue skin examinations with Dr. Elvera Lennox.   I do not recommend surveillance imaging.  I recommended he have the right testicle evaluated by Dr. Alinda Money and Dr. Laurann Montana.   Mason Stewart is not scheduled for a follow-up appointment at the [facility]. I am available to see him in the future as needed.   50 minutes were spent with the patient today. The majority of the time was used for counseling and coordination of care.   Betsy Coder, MD  03/03/2017, 3:02 PM

## 2017-03-13 DIAGNOSIS — N503 Cyst of epididymis: Secondary | ICD-10-CM | POA: Diagnosis not present

## 2017-03-13 DIAGNOSIS — C61 Malignant neoplasm of prostate: Secondary | ICD-10-CM | POA: Diagnosis not present

## 2017-05-04 DIAGNOSIS — H401131 Primary open-angle glaucoma, bilateral, mild stage: Secondary | ICD-10-CM | POA: Diagnosis not present

## 2017-08-17 DIAGNOSIS — R1032 Left lower quadrant pain: Secondary | ICD-10-CM | POA: Diagnosis not present

## 2017-08-17 DIAGNOSIS — R3915 Urgency of urination: Secondary | ICD-10-CM | POA: Diagnosis not present

## 2017-09-23 DIAGNOSIS — H401131 Primary open-angle glaucoma, bilateral, mild stage: Secondary | ICD-10-CM | POA: Diagnosis not present

## 2017-10-01 DIAGNOSIS — Z85820 Personal history of malignant melanoma of skin: Secondary | ICD-10-CM | POA: Diagnosis not present

## 2017-10-01 DIAGNOSIS — D225 Melanocytic nevi of trunk: Secondary | ICD-10-CM | POA: Diagnosis not present

## 2017-10-01 DIAGNOSIS — L821 Other seborrheic keratosis: Secondary | ICD-10-CM | POA: Diagnosis not present

## 2017-10-01 DIAGNOSIS — D1801 Hemangioma of skin and subcutaneous tissue: Secondary | ICD-10-CM | POA: Diagnosis not present

## 2017-10-01 DIAGNOSIS — L814 Other melanin hyperpigmentation: Secondary | ICD-10-CM | POA: Diagnosis not present

## 2018-01-05 DIAGNOSIS — H401131 Primary open-angle glaucoma, bilateral, mild stage: Secondary | ICD-10-CM | POA: Diagnosis not present

## 2018-01-14 DIAGNOSIS — R202 Paresthesia of skin: Secondary | ICD-10-CM | POA: Diagnosis not present

## 2018-01-14 DIAGNOSIS — Z5181 Encounter for therapeutic drug level monitoring: Secondary | ICD-10-CM | POA: Diagnosis not present

## 2018-01-14 DIAGNOSIS — Z0000 Encounter for general adult medical examination without abnormal findings: Secondary | ICD-10-CM | POA: Diagnosis not present

## 2018-01-14 DIAGNOSIS — Z8546 Personal history of malignant neoplasm of prostate: Secondary | ICD-10-CM | POA: Diagnosis not present

## 2018-01-14 DIAGNOSIS — I10 Essential (primary) hypertension: Secondary | ICD-10-CM | POA: Diagnosis not present

## 2018-01-14 DIAGNOSIS — R739 Hyperglycemia, unspecified: Secondary | ICD-10-CM | POA: Diagnosis not present

## 2018-01-14 DIAGNOSIS — Z1389 Encounter for screening for other disorder: Secondary | ICD-10-CM | POA: Diagnosis not present

## 2018-01-14 DIAGNOSIS — K219 Gastro-esophageal reflux disease without esophagitis: Secondary | ICD-10-CM | POA: Diagnosis not present

## 2018-01-14 DIAGNOSIS — D519 Vitamin B12 deficiency anemia, unspecified: Secondary | ICD-10-CM | POA: Diagnosis not present

## 2018-01-14 LAB — TSH: TSH: 1.11 (ref <=5.90)

## 2018-01-19 ENCOUNTER — Encounter: Payer: Self-pay | Admitting: Neurology

## 2018-01-26 DIAGNOSIS — H401131 Primary open-angle glaucoma, bilateral, mild stage: Secondary | ICD-10-CM | POA: Diagnosis not present

## 2018-01-28 ENCOUNTER — Encounter: Payer: PRIVATE HEALTH INSURANCE | Admitting: Neurology

## 2018-03-22 DIAGNOSIS — E538 Deficiency of other specified B group vitamins: Secondary | ICD-10-CM | POA: Diagnosis not present

## 2018-05-04 DIAGNOSIS — H401131 Primary open-angle glaucoma, bilateral, mild stage: Secondary | ICD-10-CM | POA: Diagnosis not present

## 2018-05-18 DIAGNOSIS — R509 Fever, unspecified: Secondary | ICD-10-CM | POA: Diagnosis not present

## 2018-05-18 DIAGNOSIS — R05 Cough: Secondary | ICD-10-CM | POA: Diagnosis not present

## 2018-08-10 DIAGNOSIS — H401131 Primary open-angle glaucoma, bilateral, mild stage: Secondary | ICD-10-CM | POA: Diagnosis not present

## 2018-10-01 DIAGNOSIS — L570 Actinic keratosis: Secondary | ICD-10-CM | POA: Diagnosis not present

## 2018-10-01 DIAGNOSIS — D2262 Melanocytic nevi of left upper limb, including shoulder: Secondary | ICD-10-CM | POA: Diagnosis not present

## 2018-10-01 DIAGNOSIS — D2261 Melanocytic nevi of right upper limb, including shoulder: Secondary | ICD-10-CM | POA: Diagnosis not present

## 2018-10-01 DIAGNOSIS — D225 Melanocytic nevi of trunk: Secondary | ICD-10-CM | POA: Diagnosis not present

## 2018-10-01 DIAGNOSIS — L821 Other seborrheic keratosis: Secondary | ICD-10-CM | POA: Diagnosis not present

## 2018-10-01 DIAGNOSIS — Z85820 Personal history of malignant melanoma of skin: Secondary | ICD-10-CM | POA: Diagnosis not present

## 2018-11-09 DIAGNOSIS — H401131 Primary open-angle glaucoma, bilateral, mild stage: Secondary | ICD-10-CM | POA: Diagnosis not present

## 2019-02-01 DIAGNOSIS — R7301 Impaired fasting glucose: Secondary | ICD-10-CM | POA: Diagnosis not present

## 2019-02-01 DIAGNOSIS — Z85820 Personal history of malignant melanoma of skin: Secondary | ICD-10-CM | POA: Diagnosis not present

## 2019-02-01 DIAGNOSIS — R21 Rash and other nonspecific skin eruption: Secondary | ICD-10-CM | POA: Diagnosis not present

## 2019-02-01 DIAGNOSIS — K219 Gastro-esophageal reflux disease without esophagitis: Secondary | ICD-10-CM | POA: Diagnosis not present

## 2019-02-01 DIAGNOSIS — Z1389 Encounter for screening for other disorder: Secondary | ICD-10-CM | POA: Diagnosis not present

## 2019-02-01 DIAGNOSIS — Z8546 Personal history of malignant neoplasm of prostate: Secondary | ICD-10-CM | POA: Diagnosis not present

## 2019-02-01 DIAGNOSIS — I10 Essential (primary) hypertension: Secondary | ICD-10-CM | POA: Diagnosis not present

## 2019-02-01 DIAGNOSIS — Z0000 Encounter for general adult medical examination without abnormal findings: Secondary | ICD-10-CM | POA: Diagnosis not present

## 2019-02-01 DIAGNOSIS — Z13220 Encounter for screening for lipoid disorders: Secondary | ICD-10-CM | POA: Diagnosis not present

## 2019-02-03 DIAGNOSIS — Z13220 Encounter for screening for lipoid disorders: Secondary | ICD-10-CM | POA: Diagnosis not present

## 2019-02-03 DIAGNOSIS — R7301 Impaired fasting glucose: Secondary | ICD-10-CM | POA: Diagnosis not present

## 2019-02-03 DIAGNOSIS — Z8546 Personal history of malignant neoplasm of prostate: Secondary | ICD-10-CM | POA: Diagnosis not present

## 2019-02-03 DIAGNOSIS — I10 Essential (primary) hypertension: Secondary | ICD-10-CM | POA: Diagnosis not present

## 2019-02-15 DIAGNOSIS — H401131 Primary open-angle glaucoma, bilateral, mild stage: Secondary | ICD-10-CM | POA: Diagnosis not present

## 2019-05-17 DIAGNOSIS — H40033 Anatomical narrow angle, bilateral: Secondary | ICD-10-CM | POA: Diagnosis not present

## 2019-05-17 DIAGNOSIS — H401131 Primary open-angle glaucoma, bilateral, mild stage: Secondary | ICD-10-CM | POA: Diagnosis not present

## 2019-05-17 DIAGNOSIS — H524 Presbyopia: Secondary | ICD-10-CM | POA: Diagnosis not present

## 2019-08-03 DIAGNOSIS — Z23 Encounter for immunization: Secondary | ICD-10-CM | POA: Diagnosis not present

## 2019-08-03 DIAGNOSIS — I10 Essential (primary) hypertension: Secondary | ICD-10-CM | POA: Diagnosis not present

## 2019-08-03 DIAGNOSIS — Z8546 Personal history of malignant neoplasm of prostate: Secondary | ICD-10-CM | POA: Diagnosis not present

## 2019-08-03 DIAGNOSIS — K219 Gastro-esophageal reflux disease without esophagitis: Secondary | ICD-10-CM | POA: Diagnosis not present

## 2019-08-03 DIAGNOSIS — R7301 Impaired fasting glucose: Secondary | ICD-10-CM | POA: Diagnosis not present

## 2019-09-20 DIAGNOSIS — H2513 Age-related nuclear cataract, bilateral: Secondary | ICD-10-CM | POA: Diagnosis not present

## 2019-09-20 DIAGNOSIS — H401131 Primary open-angle glaucoma, bilateral, mild stage: Secondary | ICD-10-CM | POA: Diagnosis not present

## 2019-10-10 DIAGNOSIS — L570 Actinic keratosis: Secondary | ICD-10-CM | POA: Diagnosis not present

## 2019-10-10 DIAGNOSIS — D225 Melanocytic nevi of trunk: Secondary | ICD-10-CM | POA: Diagnosis not present

## 2019-10-10 DIAGNOSIS — D485 Neoplasm of uncertain behavior of skin: Secondary | ICD-10-CM | POA: Diagnosis not present

## 2019-10-10 DIAGNOSIS — Z85820 Personal history of malignant melanoma of skin: Secondary | ICD-10-CM | POA: Diagnosis not present

## 2019-10-10 DIAGNOSIS — L820 Inflamed seborrheic keratosis: Secondary | ICD-10-CM | POA: Diagnosis not present

## 2019-10-10 DIAGNOSIS — L814 Other melanin hyperpigmentation: Secondary | ICD-10-CM | POA: Diagnosis not present

## 2019-10-10 DIAGNOSIS — L821 Other seborrheic keratosis: Secondary | ICD-10-CM | POA: Diagnosis not present

## 2019-11-01 ENCOUNTER — Ambulatory Visit: Payer: Medicare Other | Attending: Internal Medicine

## 2019-11-01 DIAGNOSIS — Z23 Encounter for immunization: Secondary | ICD-10-CM | POA: Diagnosis not present

## 2019-11-01 NOTE — Progress Notes (Signed)
   Covid-19 Vaccination Clinic  Name:  Mason Stewart    MRN: [id number] DOB: 11/29/1950  11/01/2019  Mason Stewart was observed post Covid-19 immunization for 15 minutes without incidence. He was provided with Vaccine Information Sheet and instruction to access the V-Safe system.   Mason Stewart was instructed to call 911 with any severe reactions post vaccine: Marland Kitchen Difficulty breathing  . Swelling of your face and throat  . A fast heartbeat  . A bad rash all over your body  . Dizziness and weakness    Immunizations Administered    Name Date Dose VIS Date Route   Pfizer COVID-19 Vaccine 11/01/2019  9:27 AM 0.3 mL 09/23/2019 Intramuscular   Manufacturer: [place]   Lot: [id number]   [place]: [id number]

## 2019-11-20 ENCOUNTER — Ambulatory Visit: Payer: Medicare Other | Attending: Internal Medicine

## 2019-11-20 DIAGNOSIS — Z23 Encounter for immunization: Secondary | ICD-10-CM | POA: Insufficient documentation

## 2019-11-20 NOTE — Progress Notes (Signed)
   Covid-19 Vaccination Clinic  Name:  Mason Stewart    MRN: [id number] DOB: [birth year]  11/20/2019  Mason Stewart was observed post Covid-19 immunization for 15 minutes without incidence. He was provided with Vaccine Information Sheet and instruction to access the V-Safe system.   Mason Stewart was instructed to call 911 with any severe reactions post vaccine: Marland Kitchen Difficulty breathing  . Swelling of your face and throat  . A fast heartbeat  . A bad rash all over your body  . Dizziness and weakness    Immunizations Administered    Name Date Dose VIS Date Route   Pfizer COVID-19 Vaccine 11/20/2019 12:36 PM 0.3 mL 09/23/2019 Intramuscular   Manufacturer: [place]   Lot: [id number]   [place]: [id number]

## 2020-01-24 DIAGNOSIS — H401131 Primary open-angle glaucoma, bilateral, mild stage: Secondary | ICD-10-CM | POA: Diagnosis not present

## 2020-02-03 ENCOUNTER — Encounter: Payer: Self-pay | Admitting: Family Medicine

## 2020-02-03 DIAGNOSIS — Z0000 Encounter for general adult medical examination without abnormal findings: Secondary | ICD-10-CM | POA: Diagnosis not present

## 2020-02-03 DIAGNOSIS — K219 Gastro-esophageal reflux disease without esophagitis: Secondary | ICD-10-CM | POA: Diagnosis not present

## 2020-02-03 DIAGNOSIS — I10 Essential (primary) hypertension: Secondary | ICD-10-CM | POA: Diagnosis not present

## 2020-02-03 DIAGNOSIS — Z85820 Personal history of malignant melanoma of skin: Secondary | ICD-10-CM | POA: Diagnosis not present

## 2020-02-03 DIAGNOSIS — Z1389 Encounter for screening for other disorder: Secondary | ICD-10-CM | POA: Diagnosis not present

## 2020-02-03 DIAGNOSIS — R7301 Impaired fasting glucose: Secondary | ICD-10-CM | POA: Diagnosis not present

## 2020-02-03 DIAGNOSIS — E538 Deficiency of other specified B group vitamins: Secondary | ICD-10-CM | POA: Diagnosis not present

## 2020-02-03 DIAGNOSIS — Z23 Encounter for immunization: Secondary | ICD-10-CM | POA: Diagnosis not present

## 2020-02-03 DIAGNOSIS — Z8546 Personal history of malignant neoplasm of prostate: Secondary | ICD-10-CM | POA: Diagnosis not present

## 2020-02-03 LAB — LIPID PANEL
Cholesterol: 133 (ref 0–200)
HDL: 34 — AB (ref 35–70)
LDL Cholesterol: 78
LDl/HDL Ratio: 4
Triglycerides: 106 (ref 40–160)

## 2020-02-03 LAB — BASIC METABOLIC PANEL
BUN: 12 (ref 4–21)
CO2: 26 — AB (ref 13–22)
Chloride: 103 (ref 99–108)
Creatinine: 1.1 (ref 0.6–1.3)
Glucose: 152
Potassium: 4.2 (ref 3.4–5.3)
Sodium: 138 (ref 137–147)

## 2020-02-03 LAB — COMPREHENSIVE METABOLIC PANEL
Calcium: 9.3 (ref 8.7–10.7)
GFR calc Af Amer: 85
GFR calc non Af Amer: 70

## 2020-02-03 LAB — HEMOGLOBIN A1C: Hemoglobin A1C: 6.8

## 2020-02-03 LAB — PSA: PSA: <0.01

## 2020-02-03 LAB — VITAMIN B12: Vitamin B-12: 422

## 2020-03-08 ENCOUNTER — Telehealth: Payer: Self-pay | Admitting: Internal Medicine

## 2020-03-08 NOTE — Telephone Encounter (Signed)
Okay to accept new patient

## 2020-03-08 NOTE — Telephone Encounter (Signed)
Patient's wife came in stating that she spoke with Dr.Bedsole before about husband becoming a patient of hers,I told her I would have to make sure it was fine with first before scheduling anything. She said she was aware that the appointment would need to be pushed out and they are fine with that.

## 2020-05-03 ENCOUNTER — Other Ambulatory Visit: Payer: Self-pay

## 2020-05-03 ENCOUNTER — Encounter: Payer: Self-pay | Admitting: Family Medicine

## 2020-05-03 ENCOUNTER — Ambulatory Visit (INDEPENDENT_AMBULATORY_CARE_PROVIDER_SITE_OTHER): Payer: Medicare Other | Admitting: Family Medicine

## 2020-05-03 DIAGNOSIS — N529 Male erectile dysfunction, unspecified: Secondary | ICD-10-CM | POA: Insufficient documentation

## 2020-05-03 DIAGNOSIS — K219 Gastro-esophageal reflux disease without esophagitis: Secondary | ICD-10-CM | POA: Diagnosis not present

## 2020-05-03 DIAGNOSIS — Z85820 Personal history of malignant melanoma of skin: Secondary | ICD-10-CM | POA: Diagnosis not present

## 2020-05-03 DIAGNOSIS — I10 Essential (primary) hypertension: Secondary | ICD-10-CM | POA: Insufficient documentation

## 2020-05-03 DIAGNOSIS — Z8546 Personal history of malignant neoplasm of prostate: Secondary | ICD-10-CM | POA: Diagnosis not present

## 2020-05-03 DIAGNOSIS — N5231 Erectile dysfunction following radical prostatectomy: Secondary | ICD-10-CM

## 2020-05-03 DIAGNOSIS — H409 Unspecified glaucoma: Secondary | ICD-10-CM | POA: Diagnosis not present

## 2020-05-03 DIAGNOSIS — Z9079 Acquired absence of other genital organ(s): Secondary | ICD-10-CM | POA: Insufficient documentation

## 2020-05-03 MED ORDER — TADALAFIL 10 MG PO TABS: 5.0000 mg | ORAL_TABLET | Freq: Every day | ORAL | 0 refills | Status: DC | PRN

## 2020-05-03 NOTE — Assessment & Plan Note (Addendum)
Previously treated with Cialis. Issues since prostatectomy. Refill provided. Discussed using lowest effective dose.

## 2020-05-03 NOTE — Assessment & Plan Note (Signed)
Hx of bleeding ulcer. And taking omeprazole every few days

## 2020-05-03 NOTE — Assessment & Plan Note (Signed)
Using eye drops and following with ophthalmology. Will obtain records

## 2020-05-03 NOTE — Patient Instructions (Signed)
Shingles Shot - we will request the records from [facility]  - may be less expensive at a pharmacy

## 2020-05-03 NOTE — Assessment & Plan Note (Signed)
Get outside records. Plan for annual PSA

## 2020-05-03 NOTE — Assessment & Plan Note (Signed)
Will get records. Encouraged continued dermatology care

## 2020-05-03 NOTE — Assessment & Plan Note (Signed)
Controlled. Cont Losartan-HCTZ

## 2020-05-03 NOTE — Progress Notes (Signed)
Subjective:     Mason Stewart is a 69 y.o. male presenting for Establish Care     HPI   Had wellness exam in April Got first shingles shot Getting psa followed with pcp Has been taking 1/2 tablet of bp as his bp has been normal at home  Review of Systems   Social History   Tobacco Use  Smoking Status Never Smoker  Smokeless Tobacco Never Used        Objective:    BP Readings from Last 3 Encounters:  05/03/20 122/80  03/03/17 139/80  09/26/14 (!) 152/78   Wt Readings from Last 3 Encounters:  05/03/20 226 lb (102.5 kg)  03/03/17 227 lb 4.8 oz (103.1 kg)  09/26/14 223 lb 1.6 oz (101.2 kg)    BP 122/80   Pulse 78   Temp 97.8 F (36.6 C) (Temporal)   Ht 5' 5.5" (1.664 m)   Wt 226 lb (102.5 kg)   SpO2 97%   BMI 37.04 kg/m    Physical Exam Constitutional:      Appearance: Normal appearance. He is not ill-appearing or diaphoretic.  HENT:     Right Ear: External ear normal.     Left Ear: External ear normal.     Nose: Nose normal.  Eyes:     General: No scleral icterus.    Extraocular Movements: Extraocular movements intact.     Conjunctiva/sclera: Conjunctivae normal.  Cardiovascular:     Rate and Rhythm: Normal rate and regular rhythm.     Heart sounds: No murmur heard.   Pulmonary:     Effort: Pulmonary effort is normal. No respiratory distress.     Breath sounds: Normal breath sounds. No wheezing.  Musculoskeletal:     Cervical back: Neck supple.  Skin:    General: Skin is warm and dry.  Neurological:     Mental Status: He is alert. Mental status is at baseline.  Psychiatric:        Mood and Affect: Mood normal.           Assessment & Plan:   Problem List Items Addressed This Visit      Cardiovascular and Mediastinum   Hypertension    Controlled. Cont Losartan-HCTZ      Relevant Medications   tadalafil (CIALIS) 10 MG tablet     Digestive   GERD (gastroesophageal reflux disease)    Hx of bleeding ulcer. And taking  omeprazole every few days        Other   History of prostate cancer    Get outside records. Plan for annual PSA      History of melanoma    Will get records. Encouraged continued dermatology care      Glaucoma    Using eye drops and following with ophthalmology. Will obtain records      Relevant Medications   latanoprost (XALATAN) 0.005 % ophthalmic solution   dorzolamide-timolol (COSOPT) 22.3-6.8 MG/ML ophthalmic solution   Erectile dysfunction    Previously treated with Cialis. Issues since prostatectomy. Refill provided. Discussed using lowest effective dose.       Relevant Medications   tadalafil (CIALIS) 10 MG tablet       Return in about 9 months (around 02/01/2021) for wellness.  Lesleigh Noe, MD  This visit occurred during the SARS-CoV-2 public health emergency.  Safety protocols were in place, including screening questions prior to the visit, additional usage of staff PPE, and extensive cleaning of exam room while observing appropriate contact  time as indicated for disinfecting solutions.

## 2020-05-14 ENCOUNTER — Telehealth: Payer: Self-pay | Admitting: Family Medicine

## 2020-05-14 NOTE — Telephone Encounter (Signed)
Recv'd records from [facility] forwarded 27 pages to Dr. Waunita Schooner 8/2/21fbg

## 2020-05-31 DIAGNOSIS — H401132 Primary open-angle glaucoma, bilateral, moderate stage: Secondary | ICD-10-CM | POA: Diagnosis not present

## 2020-06-01 ENCOUNTER — Encounter: Payer: Self-pay | Admitting: Family Medicine

## 2020-07-05 DIAGNOSIS — H401132 Primary open-angle glaucoma, bilateral, moderate stage: Secondary | ICD-10-CM | POA: Diagnosis not present

## 2020-09-20 DIAGNOSIS — H401132 Primary open-angle glaucoma, bilateral, moderate stage: Secondary | ICD-10-CM | POA: Diagnosis not present

## 2020-10-01 DIAGNOSIS — H40053 Ocular hypertension, bilateral: Secondary | ICD-10-CM | POA: Diagnosis not present

## 2020-10-09 DIAGNOSIS — D2271 Melanocytic nevi of right lower limb, including hip: Secondary | ICD-10-CM | POA: Diagnosis not present

## 2020-10-09 DIAGNOSIS — L820 Inflamed seborrheic keratosis: Secondary | ICD-10-CM | POA: Diagnosis not present

## 2020-10-09 DIAGNOSIS — D1801 Hemangioma of skin and subcutaneous tissue: Secondary | ICD-10-CM | POA: Diagnosis not present

## 2020-10-09 DIAGNOSIS — D2262 Melanocytic nevi of left upper limb, including shoulder: Secondary | ICD-10-CM | POA: Diagnosis not present

## 2020-10-09 DIAGNOSIS — D485 Neoplasm of uncertain behavior of skin: Secondary | ICD-10-CM | POA: Diagnosis not present

## 2020-10-09 DIAGNOSIS — L814 Other melanin hyperpigmentation: Secondary | ICD-10-CM | POA: Diagnosis not present

## 2020-10-09 DIAGNOSIS — L821 Other seborrheic keratosis: Secondary | ICD-10-CM | POA: Diagnosis not present

## 2020-10-09 DIAGNOSIS — D225 Melanocytic nevi of trunk: Secondary | ICD-10-CM | POA: Diagnosis not present

## 2020-10-09 DIAGNOSIS — Z85820 Personal history of malignant melanoma of skin: Secondary | ICD-10-CM | POA: Diagnosis not present

## 2020-10-09 DIAGNOSIS — D2272 Melanocytic nevi of left lower limb, including hip: Secondary | ICD-10-CM | POA: Diagnosis not present

## 2020-10-09 DIAGNOSIS — L570 Actinic keratosis: Secondary | ICD-10-CM | POA: Diagnosis not present

## 2020-10-19 DIAGNOSIS — H40053 Ocular hypertension, bilateral: Secondary | ICD-10-CM | POA: Diagnosis not present

## 2020-12-17 DIAGNOSIS — H401111 Primary open-angle glaucoma, right eye, mild stage: Secondary | ICD-10-CM | POA: Diagnosis not present

## 2020-12-17 DIAGNOSIS — H401123 Primary open-angle glaucoma, left eye, severe stage: Secondary | ICD-10-CM | POA: Diagnosis not present

## 2021-02-11 ENCOUNTER — Ambulatory Visit: Payer: Medicare Other

## 2021-02-14 ENCOUNTER — Other Ambulatory Visit: Payer: Self-pay

## 2021-02-14 ENCOUNTER — Ambulatory Visit (INDEPENDENT_AMBULATORY_CARE_PROVIDER_SITE_OTHER): Payer: Medicare Other | Admitting: Family Medicine

## 2021-02-14 VITALS — BP 122/80 | HR 76 | Temp 97.2°F | Ht 66.0 in | Wt 223.5 lb

## 2021-02-14 DIAGNOSIS — Z125 Encounter for screening for malignant neoplasm of prostate: Secondary | ICD-10-CM | POA: Diagnosis not present

## 2021-02-14 DIAGNOSIS — H409 Unspecified glaucoma: Secondary | ICD-10-CM

## 2021-02-14 DIAGNOSIS — Z9079 Acquired absence of other genital organ(s): Secondary | ICD-10-CM

## 2021-02-14 DIAGNOSIS — Z8546 Personal history of malignant neoplasm of prostate: Secondary | ICD-10-CM | POA: Diagnosis not present

## 2021-02-14 DIAGNOSIS — E118 Type 2 diabetes mellitus with unspecified complications: Secondary | ICD-10-CM | POA: Diagnosis not present

## 2021-02-14 DIAGNOSIS — I10 Essential (primary) hypertension: Secondary | ICD-10-CM | POA: Diagnosis not present

## 2021-02-14 DIAGNOSIS — Z0000 Encounter for general adult medical examination without abnormal findings: Secondary | ICD-10-CM | POA: Diagnosis not present

## 2021-02-14 DIAGNOSIS — Z1159 Encounter for screening for other viral diseases: Secondary | ICD-10-CM | POA: Diagnosis not present

## 2021-02-14 LAB — LIPID PANEL
Cholesterol: 134 mg/dL (ref 0–200)
HDL: 38.3 mg/dL — ABNORMAL LOW (ref >39.00)
LDL Cholesterol: 74 mg/dL (ref 0–99)
NonHDL: 95.27
Total CHOL/HDL Ratio: 3
Triglycerides: 106 mg/dL (ref 0.0–149.0)
VLDL: 21.2 mg/dL (ref 0.0–40.0)

## 2021-02-14 LAB — COMPREHENSIVE METABOLIC PANEL
ALT: 24 U/L (ref 0–53)
AST: 22 U/L (ref 0–37)
Albumin: 4.1 g/dL (ref 3.5–5.2)
Alkaline Phosphatase: 78 U/L (ref 39–117)
BUN: 14 mg/dL (ref 6–23)
CO2: 27 mEq/L (ref 19–32)
Calcium: 9.1 mg/dL (ref 8.4–10.5)
Chloride: 102 mEq/L (ref 96–112)
Creatinine, Ser: 0.93 mg/dL (ref 0.40–1.50)
GFR: 83.42 mL/min (ref >60.00)
Glucose, Bld: 196 mg/dL — ABNORMAL HIGH (ref 70–99)
Potassium: 4.1 mEq/L (ref 3.5–5.1)
Sodium: 137 mEq/L (ref 135–145)
Total Bilirubin: 1.2 mg/dL (ref 0.2–1.2)
Total Protein: 6.7 g/dL (ref 6.0–8.3)

## 2021-02-14 LAB — HEMOGLOBIN A1C: Hgb A1c MFr Bld: 7.5 % — ABNORMAL HIGH (ref 4.6–6.5)

## 2021-02-14 LAB — PSA, MEDICARE: PSA: 0 ng/ml — ABNORMAL LOW (ref 0.10–4.00)

## 2021-02-14 NOTE — Patient Instructions (Signed)
Will get records for pneumonia and colon cancer    [email or web address] for information about diabetes diet

## 2021-02-14 NOTE — Progress Notes (Signed)
Subjective:   Mason Stewart is a 70 y.o. male who presents for Medicare Annual/Subsequent preventive examination.  Review of Systems    Review of Systems  Constitutional: Negative for chills and fever.  HENT: Negative for congestion and sore throat.   Eyes: Negative for blurred vision and double vision.  Respiratory: Negative for shortness of breath.   Cardiovascular: Negative for chest pain.  Gastrointestinal: Negative for heartburn, nausea and vomiting.  Genitourinary: Negative.   Musculoskeletal: Negative.  Negative for myalgias.  Skin: Negative for rash.  Neurological: Negative for dizziness and headaches.  Endo/Heme/Allergies: Does not bruise/bleed easily.  Psychiatric/Behavioral: Negative for depression. The patient is not nervous/anxious.     Cardiac Risk Factors include: advanced age (>24men, >49 women);dyslipidemia;male gender;hypertension     Objective:    Today's Vitals   02/14/21 0948  BP: 122/80  Pulse: 76  Temp: (!) 97.2 F (36.2 C)  TempSrc: Temporal  SpO2: 95%  Weight: 223 lb 8 oz (101.4 kg)  Height: 5\' 6"  (1.676 m)   Body mass index is 36.07 kg/m.  Advanced Directives 02/14/2021 03/03/2017 12/07/2013 11/16/2013 06/07/2012 06/03/2012  Does Patient Have a Medical Advance Directive? No No Patient does not have advance directive;Patient would not like information Patient does not have advance directive;Patient would not like information Patient does not have advance directive;Patient would not like information Patient does not have advance directive;Patient would not like information  Would patient like information on creating a medical advance directive? Yes (MAU/Ambulatory/Procedural Areas - Information given) No - Patient declined - - - -  Pre-existing out of facility DNR order (yellow form or pink MOST form) - - - - No No    Current Medications (verified) Outpatient Encounter Medications as of 02/14/2021  Medication Sig  . apraclonidine (IOPIDINE) 0.5 %  ophthalmic solution Apply to eye.  . cyanocobalamin 1000 MCG tablet Take 1 tablet by mouth daily.  . dorzolamide-timolol (COSOPT) 22.3-6.8 MG/ML ophthalmic solution 1 drop 2 (two) times daily.  Marland Kitchen latanoprost (XALATAN) 0.005 % ophthalmic solution Place 1 drop into both eyes at bedtime.  Marland Kitchen losartan-hydrochlorothiazide (HYZAAR) 50-12.5 MG per tablet Take 0.5 tablets by mouth daily before breakfast.   . Multiple Vitamin (MULTI-VITAMIN) tablet Take 1 tablet by mouth daily.  Marland Kitchen omeprazole (PRILOSEC OTC) 20 MG tablet Take 20 mg by mouth as needed.   . tadalafil (CIALIS) 10 MG tablet Take 0.5-1 tablets (5-10 mg total) by mouth daily as needed for erectile dysfunction.   No facility-administered encounter medications on file as of 02/14/2021.    Allergies (verified) Patient has no known allergies.   History: Past Medical History:  Diagnosis Date  . Cancer [facility])    prostate cancer  . GERD (gastroesophageal reflux disease)   . Glaucoma (increased eye pressure)   . History of prostate cancer 12/12/2013  . Hypertension   . Melanoma (HCC)   . Melanoma of thoracic region (HCC) 12/12/2013  . 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 Sleep apnea    stopbang =7-has not had a study  . Wound dehiscence 12/12/2013   Past Surgical History:  Procedure Laterality Date  . AXILLARY LYMPH NODE DISSECTION Right 12/13/2013   Procedure: AXILLARY LYMPH NODE DISSECTION;  Surgeon: Clovis Pu. Cornett, MD;  Location: [facility];  Service: General;  Laterality: Right;  . BACK SURGERY  1997   lower back  . colonscopy  2012  . EXCISION MELANOMA WITH SENTINEL LYMPH NODE BIOPSY  Right 11/23/2013   Procedure: EXCISION MELANOMA WITH SENTINEL LYMPH NODE BIOPSY;  Surgeon: Joyice Faster. Cornett, MD;  Location: [place];  Service: General;  Laterality: Right;  . left middle finger surgery after injury  yrs ago  . ROBOT ASSISTED LAPAROSCOPIC RADICAL  PROSTATECTOMY  06/07/2012   Procedure: ROBOTIC ASSISTED LAPAROSCOPIC RADICAL PROSTATECTOMY LEVEL 2;  Surgeon: Dutch Gray, MD;  Location: [facility];  Service: Urology;  Laterality: N/A;       Family History  Problem Relation Age of Onset  . Heart disease Mother   . Cancer Father        mesthelioma  . Glaucoma Father   . Hypertension Sister   . Hypertension Brother   . Arrhythmia Brother   . Birth defects Son        Batton's disease  . Glaucoma Sister    Social History   Socioeconomic History  . Marital status: Married    Spouse name: Mariann Laster  . Number of children: 3  . Years of education: some college  . Highest education level: Not on file  Occupational History  . Not on file  Tobacco Use  . Smoking status: Never Smoker  . Smokeless tobacco: Never Used  Vaping Use  . Vaping Use: Never used  Substance and Sexual Activity  . Alcohol use: Yes    Comment: 1-2 times a week, 1 drink  . Drug use: No  . Sexual activity: Yes    Birth control/protection: Post-menopausal  Other Topics Concern  . Not on file  Social History Narrative   05/03/20   From: [place]   Living: with wife Mariann Laster [phone]) and adult daughter   Work: roofing business       Family: Verlene Mayer and Marylynn Pearson (also has one son who has passed away) - 2 grandchildren      Enjoys: work on cars, travel, vacation      Exercise: climbing Academic librarian and working   Diet: country cooking - eat a lot of Teaching laboratory technician belts: Yes    Guns: daughter is a Cytogeneticist in relationships: Yes    Social Determinants of Radio broadcast assistant Strain: Not on Art therapist Insecurity: Not on file  Transportation Needs: Not on file  Physical Activity: Not on file  Stress: Not on file  Social Connections: Not on file    Tobacco Counseling Counseling given: Not Answered   Clinical Intake:  Pre-visit preparation completed: No  Pain : No/denies pain     BMI - recorded:  36.07 Nutritional Status: BMI > 30  Obese Nutritional Risks: None Diabetes: No  How often do you need to have someone help you when you read instructions, pamphlets, or other written materials from your doctor or pharmacy?: 2 - Rarely (if no glasses) What is the last grade level you completed in school?: associates degress  Diabetic? no  Interpreter Needed?: No      Activities of Daily Living In your present state of health, do you have any difficulty performing the following activities: 02/14/2021  Hearing? N  Vision? N  Difficulty concentrating or making decisions? N  Walking or climbing stairs? N  Dressing or bathing? N  Doing errands, shopping? N  Preparing Food and eating ? N  Using the Toilet? N  In the past six months, have you accidently leaked urine? Y  Do you have problems with loss of bowel control? N  Managing your Medications? N  Managing your Finances? N  Housekeeping or managing your Housekeeping? N  Some recent data might be hidden    Patient Care Team: Lesleigh Noe, MD as PCP - General (Family Medicine) Yehuda Mao, Darene Lamer, MD as Referring Physician (Ophthalmology)  Indicate any recent Medical Services you may have received from other than [facility] providers in the past year (date may be approximate).     Assessment:   This is a routine wellness examination for Talha.  Hearing/Vision screen  Hearing Screening   125Hz  250Hz  500Hz  1000Hz  2000Hz  3000Hz  4000Hz  6000Hz  8000Hz   Right ear:  20 20 20 20  20     Left ear:  20 20 20 20  20     Vision Screening Comments: Last [facility] exam 03/22 at [facility]   Dietary issues and exercise activities discussed: Current Exercise Habits: Home exercise routine;The patient has a physically strenuous job, but has no regular exercise apart from work., Type of exercise: walking, Time (Minutes): 15, Frequency (Times/Week): 7, Weekly Exercise (Minutes/Week): 105, Intensity: Moderate, Exercise limited by: None  identified  Goals Addressed            This Visit's Progress   . Weight (lb) < 220 lb (99.8 kg)   223 lb 8 oz (101.4 kg)    Reduce soda to lose 5 lbs      Depression Screen PHQ 2/9 Scores 02/14/2021 05/03/2020  PHQ - 2 Score 0 0    Fall Risk Fall Risk  02/14/2021  Falls in the past year? 0  Number falls in past yr: 0    FALL RISK PREVENTION PERTAINING TO THE HOME:  Any stairs in or around the home? Yes  If so, are there any without handrails? Yes  Home free of loose throw rugs in walkways, pet beds, electrical cords, etc? Yes  Adequate lighting in your home to reduce risk of falls? Yes   ASSISTIVE DEVICES UTILIZED TO PREVENT FALLS:  Life alert? No  Use of a cane, walker or w/c? No  Grab bars in the bathroom? No  Shower chair or bench in shower? No  Elevated toilet seat or a handicapped toilet? Yes    Cognitive Function:       Mini-Cog - 02/14/21 1003    Normal clock drawing test? yes    How many words correct? 3              Immunizations Immunization History  Administered Date(s) Administered  . PFIZER(Purple Top)SARS-COV-2 Vaccination 11/01/2019, 11/20/2019  . Td 10/13/2005  . Tdap 01/04/2015    TDAP status: Up to date  Not season for flu shot  Will try to get records for pneumonia shot  Covid-19 vaccine status: Completed vaccines  Qualifies for Shingles Vaccine? Yes   Zostavax completed No   Shingrix Completed?: No.    Education has been provided regarding the importance of this vaccine. Patient has been advised to call insurance company to determine out of pocket expense if they have not yet received this vaccine. Advised may also receive vaccine at local pharmacy or Health Dept. Verbalized acceptance and understanding.  Screening Tests Health Maintenance  Topic Date Due  . Hepatitis C Screening  Never done  . COLONOSCOPY (Pts 45-66yrs Insurance coverage will need to be confirmed)  Never done  . PNA vac Low Risk Adult (1 of 2 - PCV13) Never  done  . COVID-19 Vaccine (3 - Booster for Pfizer series) 05/19/2020  . INFLUENZA VACCINE  05/13/2021  .  TETANUS/TDAP  01/03/2025  . HPV VACCINES  Aged Out    Health Maintenance  Health Maintenance Due  Topic Date Due  . Hepatitis C Screening  Never done  . COLONOSCOPY (Pts 45-10yrs Insurance coverage will need to be confirmed)  Never done  . PNA vac Low Risk Adult (1 of 2 - PCV13) Never done  . COVID-19 Vaccine (3 - Booster for Pfizer series) 05/19/2020    Colon cancer - reports colonoscopy w/in the last 10 years - will get records  Lung Cancer Screening: (Low Dose CT Chest recommended if Age 52-80 years, 30 pack-year currently smoking OR have quit w/in 15years.) does not qualify.   Lung Cancer Screening Referral: n/a  Additional Screening:  Hepatitis C Screening: does qualify; Completed not done- will do today  Vision Screening: Recommended annual ophthalmology exams for early detection of glaucoma and other disorders of the eye. Is the patient up to date with their annual eye exam?  Yes  Who is the provider or what is the name of the office in which the patient attends annual eye exams? [facility] If pt is not established with a provider, would they like to be referred to a provider to establish care? n/a.   Dental Screening: Recommended annual dental exams for proper oral hygiene  Community Resource Referral / Chronic Care Management: CRR required this visit?  No   CCM required this visit?  No      Plan:     I have personally reviewed and noted the following in the patient's chart:   . Medical and social history . Use of alcohol, tobacco or illicit drugs  . Current medications and supplements including opioid prescriptions. Patient is not currently taking opioid prescriptions. . Functional ability and status . Nutritional status . Physical activity . Advanced directives . List of other physicians . Hospitalizations, surgeries, and ER visits in previous 12  months . Vitals . Screenings to include cognitive, depression, and falls . Referrals and appointments  In addition, I have reviewed and discussed with patient certain preventive protocols, quality metrics, and best practice recommendations. A written personalized care plan for preventive services as well as general preventive health recommendations were provided to patient.     Lesleigh Noe, MD   02/14/2021

## 2021-02-15 LAB — HEPATITIS C ANTIBODY
Hepatitis C Ab: NONREACTIVE
SIGNAL TO CUT-OFF: 0.11 (ref <1.00)

## 2021-02-18 DIAGNOSIS — H401111 Primary open-angle glaucoma, right eye, mild stage: Secondary | ICD-10-CM | POA: Diagnosis not present

## 2021-02-26 ENCOUNTER — Encounter: Payer: Self-pay | Admitting: Family Medicine

## 2021-03-08 ENCOUNTER — Other Ambulatory Visit: Payer: Self-pay

## 2021-03-08 ENCOUNTER — Ambulatory Visit: Payer: Medicare Other

## 2021-04-24 ENCOUNTER — Other Ambulatory Visit: Payer: Self-pay | Admitting: Family Medicine

## 2021-04-26 DIAGNOSIS — H401123 Primary open-angle glaucoma, left eye, severe stage: Secondary | ICD-10-CM | POA: Diagnosis not present

## 2021-04-26 DIAGNOSIS — H401111 Primary open-angle glaucoma, right eye, mild stage: Secondary | ICD-10-CM | POA: Diagnosis not present

## 2021-05-27 ENCOUNTER — Ambulatory Visit (INDEPENDENT_AMBULATORY_CARE_PROVIDER_SITE_OTHER): Payer: Medicare Other | Admitting: Family Medicine

## 2021-05-27 ENCOUNTER — Other Ambulatory Visit: Payer: Self-pay

## 2021-05-27 VITALS — BP 124/80 | HR 67 | Temp 98.3°F | Ht 66.0 in | Wt 217.0 lb

## 2021-05-27 DIAGNOSIS — I10 Essential (primary) hypertension: Secondary | ICD-10-CM

## 2021-05-27 DIAGNOSIS — E118 Type 2 diabetes mellitus with unspecified complications: Secondary | ICD-10-CM

## 2021-05-27 DIAGNOSIS — E785 Hyperlipidemia, unspecified: Secondary | ICD-10-CM | POA: Diagnosis not present

## 2021-05-27 DIAGNOSIS — E1169 Type 2 diabetes mellitus with other specified complication: Secondary | ICD-10-CM | POA: Insufficient documentation

## 2021-05-27 DIAGNOSIS — E1159 Type 2 diabetes mellitus with other circulatory complications: Secondary | ICD-10-CM | POA: Insufficient documentation

## 2021-05-27 LAB — POCT GLYCOSYLATED HEMOGLOBIN (HGB A1C): Hemoglobin A1C: 7.1 % — AB (ref 4.0–5.6)

## 2021-05-27 NOTE — Assessment & Plan Note (Addendum)
Lab Results  Component Value Date   HGBA1C 7.1 (A) 05/27/2021   C/b HTN, HLD. Does not want medication. Will continue weight loss, diet changes, exercise. On ARB. Foot exam today. Will get eye exam

## 2021-05-27 NOTE — Assessment & Plan Note (Addendum)
Lab Results  Component Value Date   LDLCALC 74 02/14/2021   Just outside of range. Will work on weight loss and diet changes. Repeat in 6 months.

## 2021-05-27 NOTE — Patient Instructions (Addendum)
Diabetes - check with your eye specialist about a diabetic eye exam and getting this done once per year - have them send records to our office - Continue to work on diet and weight loss - return in 6 months

## 2021-05-27 NOTE — Assessment & Plan Note (Signed)
BP controlled. Cont losartan-hctz 50-12.5

## 2021-05-27 NOTE — Progress Notes (Signed)
Subjective:     Mason Stewart is a 70 y.o. male presenting for Follow-up (3 month DM )     HPI  #Diabetes Currently taking diet controlled  Foot: no Does not check sugars Last HgbA1c:  Lab Results  Component Value Date   HGBA1C 7.1 (A) 05/27/2021   Diet: doing well with diet Exercise: just his regular work, walking  Has lost some weight   Diabetes Health Maintenance Due:    Diabetes Health Maintenance Due  Topic Date Due   OPHTHALMOLOGY EXAM  Never done   HEMOGLOBIN A1C  11/27/2021   FOOT EXAM  05/27/2022       Review of Systems   Social History   Tobacco Use  Smoking Status Never  Smokeless Tobacco Never        Objective:    BP Readings from Last 3 Encounters:  05/27/21 124/80  02/14/21 122/80  05/03/20 122/80   Wt Readings from Last 3 Encounters:  05/27/21 217 lb (98.4 kg)  02/14/21 223 lb 8 oz (101.4 kg)  05/03/20 226 lb (102.5 kg)    BP 124/80   Pulse 67   Temp 98.3 F (36.8 C) (Temporal)   Ht '5\' 6"'$  (1.676 m)   Wt 217 lb (98.4 kg)   SpO2 97%   BMI 35.02 kg/m    Physical Exam Constitutional:      Appearance: Normal appearance. He is not ill-appearing or diaphoretic.  HENT:     Right Ear: External ear normal.     Left Ear: External ear normal.     Nose: Nose normal.  Eyes:     General: No scleral icterus.    Extraocular Movements: Extraocular movements intact.     Conjunctiva/sclera: Conjunctivae normal.  Cardiovascular:     Rate and Rhythm: Normal rate and regular rhythm.     Heart sounds: No murmur heard. Pulmonary:     Effort: Pulmonary effort is normal. No respiratory distress.     Breath sounds: Normal breath sounds. No wheezing.  Musculoskeletal:     Cervical back: Neck supple.  Skin:    General: Skin is warm and dry.  Neurological:     Mental Status: He is alert. Mental status is at baseline.  Psychiatric:        Mood and Affect: Mood normal.   Diabetic Foot Exam - Simple   Simple Foot Form Diabetic  Foot exam was performed with the following findings: Yes 05/27/2021  9:42 AM  Visual Inspection No deformities, no ulcerations, no other skin breakdown bilaterally: Yes Sensation Testing Intact to touch and monofilament testing bilaterally: Yes Pulse Check Posterior Tibialis and Dorsalis pulse intact bilaterally: Yes Comments Toenail fungus.       Lab Results  Component Value Date   CHOL 134 02/14/2021   HDL 38.30 (L) 02/14/2021   LDLCALC 74 02/14/2021   TRIG 106.0 02/14/2021   CHOLHDL 3 02/14/2021        Assessment & Plan:   Problem List Items Addressed This Visit       Cardiovascular and Mediastinum   Hypertension    BP controlled. Cont losartan-hctz 50-12.5        Endocrine   Type 2 diabetes mellitus with other specified complication (HCC)    Lab Results  Component Value Date   HGBA1C 7.1 (A) 05/27/2021  C/b HTN, HLD. Does not want medication. Will continue weight loss, diet changes, exercise. On ARB. Foot exam today. Will get eye exam      Hyperlipidemia  associated with type 2 diabetes mellitus [facility])    Lab Results  Component Value Date   LDLCALC 74 02/14/2021  Just outside of range. Will work on weight loss and diet changes. Repeat in 6 months.       Other Visit Diagnoses     Controlled type 2 diabetes mellitus with complication, without long-term current use of insulin ([place])    -  Primary   Relevant Orders   POCT glycosylated hemoglobin (Hb A1C) (Completed)        Return in about 6 months (around 11/27/2021) for Diabetes.  Lesleigh Noe, MD  This visit occurred during the SARS-CoV-2 public health emergency.  Safety protocols were in place, including screening questions prior to the visit, additional usage of staff PPE, and extensive cleaning of exam room while observing appropriate contact time as indicated for disinfecting solutions.

## 2021-05-30 ENCOUNTER — Other Ambulatory Visit: Payer: Self-pay | Admitting: Family Medicine

## 2021-05-30 DIAGNOSIS — N5231 Erectile dysfunction following radical prostatectomy: Secondary | ICD-10-CM

## 2021-08-04 ENCOUNTER — Other Ambulatory Visit: Payer: Self-pay | Admitting: Family Medicine

## 2021-08-04 DIAGNOSIS — N5231 Erectile dysfunction following radical prostatectomy: Secondary | ICD-10-CM

## 2021-08-08 NOTE — Telephone Encounter (Signed)
Last Fill or Written Date and Quantity: 05/31/21 #30 Last Office Visit and Type: 05/27/21 Next Office Visit and Type: 11/27/21

## 2021-08-19 ENCOUNTER — Ambulatory Visit: Payer: Medicare Other | Admitting: Family Medicine

## 2021-08-30 DIAGNOSIS — H401111 Primary open-angle glaucoma, right eye, mild stage: Secondary | ICD-10-CM | POA: Diagnosis not present

## 2021-08-30 DIAGNOSIS — H401123 Primary open-angle glaucoma, left eye, severe stage: Secondary | ICD-10-CM | POA: Diagnosis not present

## 2021-11-10 DIAGNOSIS — Z1371 Encounter for nonprocreative screening for genetic disease carrier status: Secondary | ICD-10-CM | POA: Diagnosis not present

## 2021-11-18 DIAGNOSIS — H401123 Primary open-angle glaucoma, left eye, severe stage: Secondary | ICD-10-CM | POA: Diagnosis not present

## 2021-11-18 DIAGNOSIS — H401111 Primary open-angle glaucoma, right eye, mild stage: Secondary | ICD-10-CM | POA: Diagnosis not present

## 2021-11-27 ENCOUNTER — Ambulatory Visit: Payer: Medicare Other | Admitting: Family Medicine

## 2021-12-09 ENCOUNTER — Ambulatory Visit (INDEPENDENT_AMBULATORY_CARE_PROVIDER_SITE_OTHER): Payer: Medicare Other | Admitting: Family Medicine

## 2021-12-09 ENCOUNTER — Other Ambulatory Visit: Payer: Self-pay

## 2021-12-09 VITALS — BP 126/78 | HR 66 | Temp 98.1°F | Wt 216.4 lb

## 2021-12-09 DIAGNOSIS — N5231 Erectile dysfunction following radical prostatectomy: Secondary | ICD-10-CM

## 2021-12-09 DIAGNOSIS — Z1211 Encounter for screening for malignant neoplasm of colon: Secondary | ICD-10-CM

## 2021-12-09 DIAGNOSIS — I10 Essential (primary) hypertension: Secondary | ICD-10-CM | POA: Diagnosis not present

## 2021-12-09 DIAGNOSIS — E1169 Type 2 diabetes mellitus with other specified complication: Secondary | ICD-10-CM

## 2021-12-09 LAB — POCT GLYCOSYLATED HEMOGLOBIN (HGB A1C): Hemoglobin A1C: 7 % — AB (ref 4.0–5.6)

## 2021-12-09 MED ORDER — TADALAFIL 10 MG PO TABS: 10.0000 mg | ORAL_TABLET | Freq: Every day | ORAL | 1 refills | Status: DC | PRN

## 2021-12-09 NOTE — Assessment & Plan Note (Signed)
Lab Results  Component Value Date   HGBA1C 7.0 (A) 12/09/2021   Continues to have good control with diet alone.  Declined statin.  On ARB.  Return in 6 months.

## 2021-12-09 NOTE — Assessment & Plan Note (Signed)
Blood pressure at goal.  Continue losartan hydrochlorothiazide 50-12.5 mg.

## 2021-12-09 NOTE — Assessment & Plan Note (Signed)
Good control with Cialis 10 mg as needed.

## 2021-12-09 NOTE — Progress Notes (Signed)
Subjective:     Mason Stewart is a 71 y.o. male presenting for Follow-up (D[facility] )     HPI  #Diabetes Currently taking diet   Using medications without difficulties:  Hypoglycemic episodes: does not check Hyperglycemic episodes: does not check Feet problems:No  Blood Sugars averaging: does not check Last HgbA1c:  Lab Results  Component Value Date   HGBA1C 7.0 (A) 12/09/2021    Diabetes Health Maintenance Due:    Diabetes Health Maintenance Due  Topic Date Due   OPHTHALMOLOGY EXAM  Never done   FOOT EXAM  05/27/2022   HEMOGLOBIN A1C  06/08/2022    Lost some weight before christmas Diet controlled    #ED - doing well on prn cialis  - happy with this  #HTN - BP at goal - no cp, sob  Review of Systems   Social History   Tobacco Use  Smoking Status Never  Smokeless Tobacco Never        Objective:    BP Readings from Last 3 Encounters:  12/09/21 126/78  05/27/21 124/80  02/14/21 122/80   Wt Readings from Last 3 Encounters:  12/09/21 216 lb 6 oz (98.1 kg)  05/27/21 217 lb (98.4 kg)  02/14/21 223 lb 8 oz (101.4 kg)    BP 126/78    Pulse 66    Temp 98.1 F (36.7 C) (Oral)    Wt 216 lb 6 oz (98.1 kg)    SpO2 97%    BMI 34.92 kg/m    Physical Exam Constitutional:      Appearance: Normal appearance. He is not ill-appearing or diaphoretic.  HENT:     Right Ear: External ear normal.     Left Ear: External ear normal.  Eyes:     General: No scleral icterus.    Extraocular Movements: Extraocular movements intact.     Conjunctiva/sclera: Conjunctivae normal.  Cardiovascular:     Rate and Rhythm: Normal rate and regular rhythm.     Heart sounds: No murmur heard. Pulmonary:     Effort: Pulmonary effort is normal. No respiratory distress.     Breath sounds: Normal breath sounds. No wheezing.  Musculoskeletal:     Cervical back: Neck supple.  Skin:    General: Skin is warm and dry.  Neurological:     Mental Status: He is alert. Mental status  is at baseline.  Psychiatric:        Mood and Affect: Mood normal.          Assessment & Plan:   Problem List Items Addressed This Visit       Cardiovascular and Mediastinum   Hypertension    Blood pressure at goal.  Continue losartan hydrochlorothiazide 50-12.5 mg.       Relevant Medications   tadalafil (CIALIS) 10 MG tablet     Endocrine   Type 2 diabetes mellitus with other specified complication ([place]) - Primary    Lab Results  Component Value Date   HGBA1C 7.0 (A) 12/09/2021  Continues to have good control with diet alone.  Declined statin.  On ARB.  Return in 6 months.      Relevant Orders   POCT glycosylated hemoglobin (Hb A1C) (Completed)     Other   Erectile dysfunction    Good control with Cialis 10 mg as needed.      Relevant Medications   tadalafil (CIALIS) 10 MG tablet   Other Visit Diagnoses     Screening for colon cancer  Relevant Orders   Ambulatory referral to Gastroenterology        Return in about 6 months (around 06/08/2022) for cancel may appoint, wellness visit with nurse and me - 2 part.  Lesleigh Noe, MD  This visit occurred during the SARS-CoV-2 public health emergency.  Safety protocols were in place, including screening questions prior to the visit, additional usage of staff PPE, and extensive cleaning of exam room while observing appropriate contact time as indicated for disinfecting solutions.

## 2021-12-09 NOTE — Patient Instructions (Signed)
#  Referral I have placed a referral to a specialist for you. You should receive a phone call from the specialty office. Make sure your voicemail is not full and that if you are able to answer your phone to unknown or new numbers.   It may take up to 2 weeks to hear about the referral. If you do not hear anything in 2 weeks, please call our office and ask to speak with the referral coordinator.   

## 2022-01-02 DIAGNOSIS — H2513 Age-related nuclear cataract, bilateral: Secondary | ICD-10-CM | POA: Diagnosis not present

## 2022-01-02 DIAGNOSIS — H40033 Anatomical narrow angle, bilateral: Secondary | ICD-10-CM | POA: Diagnosis not present

## 2022-01-02 DIAGNOSIS — H401111 Primary open-angle glaucoma, right eye, mild stage: Secondary | ICD-10-CM | POA: Diagnosis not present

## 2022-01-02 DIAGNOSIS — H401123 Primary open-angle glaucoma, left eye, severe stage: Secondary | ICD-10-CM | POA: Diagnosis not present

## 2022-01-16 DIAGNOSIS — Z85820 Personal history of malignant melanoma of skin: Secondary | ICD-10-CM | POA: Diagnosis not present

## 2022-01-16 DIAGNOSIS — Z8546 Personal history of malignant neoplasm of prostate: Secondary | ICD-10-CM | POA: Diagnosis not present

## 2022-01-16 DIAGNOSIS — Z79899 Other long term (current) drug therapy: Secondary | ICD-10-CM | POA: Diagnosis not present

## 2022-01-16 DIAGNOSIS — H40052 Ocular hypertension, left eye: Secondary | ICD-10-CM | POA: Diagnosis not present

## 2022-01-16 DIAGNOSIS — H401123 Primary open-angle glaucoma, left eye, severe stage: Secondary | ICD-10-CM | POA: Diagnosis not present

## 2022-01-16 DIAGNOSIS — H2512 Age-related nuclear cataract, left eye: Secondary | ICD-10-CM | POA: Diagnosis not present

## 2022-02-17 ENCOUNTER — Encounter: Payer: Medicare Other | Admitting: Family Medicine

## 2022-02-26 DIAGNOSIS — D1801 Hemangioma of skin and subcutaneous tissue: Secondary | ICD-10-CM | POA: Diagnosis not present

## 2022-02-26 DIAGNOSIS — Z85820 Personal history of malignant melanoma of skin: Secondary | ICD-10-CM | POA: Diagnosis not present

## 2022-02-26 DIAGNOSIS — L814 Other melanin hyperpigmentation: Secondary | ICD-10-CM | POA: Diagnosis not present

## 2022-02-26 DIAGNOSIS — D0359 Melanoma in situ of other part of trunk: Secondary | ICD-10-CM | POA: Diagnosis not present

## 2022-02-26 DIAGNOSIS — L821 Other seborrheic keratosis: Secondary | ICD-10-CM | POA: Diagnosis not present

## 2022-02-26 DIAGNOSIS — D2261 Melanocytic nevi of right upper limb, including shoulder: Secondary | ICD-10-CM | POA: Diagnosis not present

## 2022-02-26 DIAGNOSIS — D225 Melanocytic nevi of trunk: Secondary | ICD-10-CM | POA: Diagnosis not present

## 2022-03-25 DIAGNOSIS — Z85820 Personal history of malignant melanoma of skin: Secondary | ICD-10-CM | POA: Diagnosis not present

## 2022-03-25 DIAGNOSIS — D0359 Melanoma in situ of other part of trunk: Secondary | ICD-10-CM | POA: Diagnosis not present

## 2022-03-25 DIAGNOSIS — L988 Other specified disorders of the skin and subcutaneous tissue: Secondary | ICD-10-CM | POA: Diagnosis not present

## 2022-04-08 ENCOUNTER — Other Ambulatory Visit: Payer: Self-pay | Admitting: Family Medicine

## 2022-05-06 DIAGNOSIS — Z1211 Encounter for screening for malignant neoplasm of colon: Secondary | ICD-10-CM | POA: Diagnosis not present

## 2022-05-06 DIAGNOSIS — Z9079 Acquired absence of other genital organ(s): Secondary | ICD-10-CM | POA: Diagnosis not present

## 2022-05-06 DIAGNOSIS — K5730 Diverticulosis of large intestine without perforation or abscess without bleeding: Secondary | ICD-10-CM | POA: Diagnosis not present

## 2022-05-06 DIAGNOSIS — D122 Benign neoplasm of ascending colon: Secondary | ICD-10-CM | POA: Diagnosis not present

## 2022-05-06 LAB — HM COLONOSCOPY

## 2022-05-08 DIAGNOSIS — D122 Benign neoplasm of ascending colon: Secondary | ICD-10-CM | POA: Diagnosis not present

## 2022-06-02 ENCOUNTER — Ambulatory Visit: Payer: Medicare Other

## 2022-06-09 ENCOUNTER — Encounter: Payer: Self-pay | Admitting: Family Medicine

## 2022-06-09 ENCOUNTER — Ambulatory Visit (INDEPENDENT_AMBULATORY_CARE_PROVIDER_SITE_OTHER): Payer: Medicare Other | Admitting: Family Medicine

## 2022-06-09 VITALS — BP 110/78 | HR 60 | Temp 97.8°F | Ht 65.5 in | Wt 215.0 lb

## 2022-06-09 DIAGNOSIS — Z125 Encounter for screening for malignant neoplasm of prostate: Secondary | ICD-10-CM | POA: Diagnosis not present

## 2022-06-09 DIAGNOSIS — E1169 Type 2 diabetes mellitus with other specified complication: Secondary | ICD-10-CM

## 2022-06-09 DIAGNOSIS — Z0000 Encounter for general adult medical examination without abnormal findings: Secondary | ICD-10-CM | POA: Diagnosis not present

## 2022-06-09 DIAGNOSIS — E785 Hyperlipidemia, unspecified: Secondary | ICD-10-CM | POA: Diagnosis not present

## 2022-06-09 DIAGNOSIS — Z8546 Personal history of malignant neoplasm of prostate: Secondary | ICD-10-CM

## 2022-06-09 DIAGNOSIS — I10 Essential (primary) hypertension: Secondary | ICD-10-CM | POA: Diagnosis not present

## 2022-06-09 LAB — CBC
HCT: 41.8 % (ref 39.0–52.0)
Hemoglobin: 14.7 g/dL (ref 13.0–17.0)
MCHC: 35.1 g/dL (ref 30.0–36.0)
MCV: 89.9 fl (ref 78.0–100.0)
Platelets: 234 10*3/uL (ref 150.0–400.0)
RBC: 4.65 Mil/uL (ref 4.22–5.81)
RDW: 13.6 % (ref 11.5–15.5)
WBC: 9 10*3/uL (ref 4.0–10.5)

## 2022-06-09 LAB — COMPREHENSIVE METABOLIC PANEL
ALT: 22 U/L (ref 0–53)
AST: 19 U/L (ref 0–37)
Albumin: 4 g/dL (ref 3.5–5.2)
Alkaline Phosphatase: 60 U/L (ref 39–117)
BUN: 10 mg/dL (ref 6–23)
CO2: 26 mEq/L (ref 19–32)
Calcium: 8.7 mg/dL (ref 8.4–10.5)
Chloride: 104 mEq/L (ref 96–112)
Creatinine, Ser: 0.93 mg/dL (ref 0.40–1.50)
GFR: 82.65 mL/min (ref >60.00)
Glucose, Bld: 138 mg/dL — ABNORMAL HIGH (ref 70–99)
Potassium: 4.2 mEq/L (ref 3.5–5.1)
Sodium: 138 mEq/L (ref 135–145)
Total Bilirubin: 1 mg/dL (ref 0.2–1.2)
Total Protein: 6.3 g/dL (ref 6.0–8.3)

## 2022-06-09 LAB — LIPID PANEL
Cholesterol: 121 mg/dL (ref 0–200)
HDL: 38 mg/dL — ABNORMAL LOW (ref >39.00)
LDL Cholesterol: 72 mg/dL (ref 0–99)
NonHDL: 83.38
Total CHOL/HDL Ratio: 3
Triglycerides: 59 mg/dL (ref 0.0–149.0)
VLDL: 11.8 mg/dL (ref 0.0–40.0)

## 2022-06-09 LAB — PSA, MEDICARE: PSA: 0 ng/ml — ABNORMAL LOW (ref 0.10–4.00)

## 2022-06-09 LAB — HEMOGLOBIN A1C: Hgb A1c MFr Bld: 6.4 % (ref 4.6–6.5)

## 2022-06-09 NOTE — Patient Instructions (Signed)
Continue healthy diet  Nurse practioners - Tabitha Dugal - [facility]  Dr. Silvio Pate - is retiring in 2 years but can provide some transitional care  We are working to hire a new physician

## 2022-06-09 NOTE — Progress Notes (Signed)
Hearing Screening - Comments:: Passed whisper test Vision Screening - Comments:: June 2023?  

## 2022-06-09 NOTE — Progress Notes (Signed)
Subjective:   Mason Stewart is a 71 y.o. male who presents for Medicare Annual/Subsequent preventive examination.  Review of Systems    Review of Systems  Constitutional:  Negative for chills and fever.  HENT:  Positive for tinnitus. Negative for congestion and sore throat.   Eyes:  Negative for blurred vision and double vision.  Respiratory:  Negative for shortness of breath.   Cardiovascular:  Negative for chest pain.  Gastrointestinal:  Negative for heartburn, nausea and vomiting.  Genitourinary: Negative.   Musculoskeletal: Negative.  Negative for myalgias.  Skin:  Negative for rash.  Neurological:  Negative for dizziness and headaches.  Endo/Heme/Allergies:  Does not bruise/bleed easily.  Psychiatric/Behavioral:  Negative for depression. The patient is not nervous/anxious.     Cardiac Risk Factors include: advanced age (>44mn, >>68women);diabetes mellitus;dyslipidemia     Objective:    Today's Vitals   06/09/22 0808  BP: 110/78  Pulse: 60  Temp: 97.8 F (36.6 C)  SpO2: 100%  Weight: 215 lb (97.5 kg)  Height: 5' 5.5" (1.664 m)   Body mass index is 35.23 kg/m.     06/09/2022    8:20 AM 02/14/2021   10:00 AM 03/03/2017    2:29 PM 12/07/2013   12:01 PM 11/16/2013    2:48 PM 06/07/2012    3:41 PM 06/03/2012    2:50 PM  Advanced Directives  Does Patient Have a Medical Advance Directive? No No No Patient does not have advance directive;Patient would not like information Patient does not have advance directive;Patient would not like information Patient does not have advance directive;Patient would not like information Patient does not have advance directive;Patient would not like information  Would patient like information on creating a medical advance directive? Yes (MAU/Ambulatory/Procedural Areas - Information given) Yes (MAU/Ambulatory/Procedural Areas - Information given) No - Patient declined      Pre-existing out of facility DNR order (yellow form or pink MOST  form)      No No    Current Medications (verified) Outpatient Encounter Medications as of 06/09/2022  Medication Sig   brimonidine (ALPHAGAN) 0.2 % ophthalmic solution Place 1 drop into the right eye 2 (two) times daily.   cyanocobalamin 1000 MCG tablet Take 1 tablet by mouth daily.   diphenhydrAMINE (BENADRYL) 12.5 MG chewable tablet Chew 12.5 mg by mouth at bedtime.   dorzolamide-timolol (COSOPT) 22.3-6.8 MG/ML ophthalmic solution Place 1 drop into the right eye 2 (two) times daily.   latanoprost (XALATAN) 0.005 % ophthalmic solution Place 1 drop into the right eye at bedtime.   losartan-hydrochlorothiazide (HYZAAR) 50-12.5 MG tablet TAKE 1 TABLET BY MOUTH EVERY DAY   Multiple Vitamin (MULTI-VITAMIN) tablet Take 1 tablet by mouth daily.   omeprazole (PRILOSEC OTC) 20 MG tablet Take 20 mg by mouth as needed.    tadalafil (CIALIS) 10 MG tablet Take 1 tablet (10 mg total) by mouth daily as needed for erectile dysfunction.   No facility-administered encounter medications on file as of 06/09/2022.    Allergies (verified) Patient has no known allergies.   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 Cornett, MD;  Location: [place];  Service: General;  Laterality: Right;   [place]   lower back   colonscopy  2012   EXCISION MELANOMA WITH SENTINEL LYMPH NODE BIOPSY Right 11/23/2013   Procedure:  EXCISION MELANOMA WITH SENTINEL LYMPH NODE BIOPSY;  Surgeon: Joyice Faster. Cornett, MD;  Location: [place];  Service: General;  Laterality: Right;   left middle finger surgery after injury  yrs ago   [place]  06/07/2012   Procedure: ROBOTIC ASSISTED LAPAROSCOPIC RADICAL PROSTATECTOMY LEVEL 2;  Surgeon: Dutch Gray, MD;  Location: [facility];  Service: Urology;  Laterality: N/A;       Family History  Problem Relation Age of Onset   Heart disease Mother    Cancer Father        mesthelioma   Glaucoma Father    Hypertension Sister    Hypertension Brother    Arrhythmia Brother    Birth defects Son        Batton's disease   Glaucoma Sister    Social History   Socioeconomic History   Marital status: Married    Spouse name: Mariann Laster   Number of children: 3   Years of education: some college   Highest education level: Not on file  Occupational History   Not on file  Tobacco Use   Smoking status: Never    Passive exposure: Never   Smokeless tobacco: Never  Vaping Use   Vaping Use: Never used  Substance and Sexual Activity   Alcohol use: Yes    Comment: 1-2 times a week, 1 drink   Drug use: No   Sexual activity: Yes    Birth control/protection: Post-menopausal  Other Topics Concern   Not on file  Social History Narrative   05/03/20   From: [place]   Living: with wife Mariann Laster [phone]) and adult daughter   Work: roofing business       Family: Verlene Mayer and Marylynn Pearson (also has one son who has passed away) - 2 grandchildren      Enjoys: work on cars, travel, vacation      Exercise: climbing Academic librarian and working   Diet: country cooking - eat a lot of Teaching laboratory technician belts: Yes    Guns: daughter is a Cytogeneticist in relationships: Yes    Social Determinants of Radio broadcast assistant Strain: Not on Art therapist Insecurity: Not on file  Transportation Needs: Not on file  Physical Activity:  Not on file  Stress: Not on file  Social Connections: Not on file    Tobacco Counseling Counseling given: Not Answered   Clinical Intake:  Pre-visit preparation completed: No  Pain : No/denies pain     BMI - recorded: 35.23 Nutritional Status: BMI > 30  Obese Nutritional Risks: None Diabetes: Yes CBG done?: No Did pt. bring in CBG monitor from home?: No  How often do you need to have someone help you when you read instructions, pamphlets, or other written materials from your doctor or pharmacy?: 1 - Never  Diabetic?yes  Interpreter Needed?: No      Activities of Daily Living    06/09/2022  8:21 AM  In your present state of health, do you have any difficulty performing the following activities:  Hearing? 1  Comment ringing  Vision? 0  Difficulty concentrating or making decisions? 0  Walking or climbing stairs? 0  Dressing or bathing? 0  Doing errands, shopping? 0  Preparing Food and eating ? N  Using the Toilet? N  In the past six months, have you accidently leaked urine? Y  Comment since prostate surgery  Do you have problems with loss of bowel control? N  Managing your Medications? N  Managing your Finances? N  Housekeeping or managing your Housekeeping? N    Patient Care Team: Lesleigh Noe, MD as PCP - General (Family Medicine) Yehuda Mao Darene Lamer, MD as Referring Physician (Ophthalmology) Harriett Sine, MD as Consulting Physician (Dermatology)  Indicate any recent Medical Services you may have received from other than [facility] providers in the past year (date may be approximate).     Assessment:   This is a routine wellness examination for [place].  Hearing/Vision screen Hearing Screening - Comments:: Passed whisper test Vision Screening - Comments:: June 2023  Dietary issues and exercise activities discussed: Current Exercise Habits: The patient has a physically strenuous job, but has no regular exercise apart from work., Type of  exercise: walking;Other - see comments Engineer, materials climbing), Time (Minutes): > 60, Frequency (Times/Week): 5, Weekly Exercise (Minutes/Week): 0, Intensity: Mild   Goals Addressed   None   Depression Screen    06/09/2022    8:13 AM 02/14/2021   10:31 AM 02/14/2021   10:02 AM 05/03/2020   10:43 AM  PHQ 2/9 Scores  PHQ - 2 Score 0 0 0 0    Fall Risk    06/09/2022    8:12 AM 02/14/2021    9:46 AM  [place] in the past year? 0 0  Number falls in past yr: 0 0  Injury with Fall? 0   Risk for fall due to : No Fall Risks   Follow up Falls evaluation completed     FALL RISK PREVENTION PERTAINING TO THE HOME:  Any stairs in or around the home? Yes  If so, are there any without handrails? Yes  Home free of loose throw rugs in walkways, pet beds, electrical cords, etc? Yes  Adequate lighting in your home to reduce risk of falls? Yes   ASSISTIVE DEVICES UTILIZED TO PREVENT FALLS:  Life alert? No  Use of a cane, walker or w/c? No  Grab bars in the bathroom? No  Shower chair or bench in shower? No  Elevated toilet seat or a handicapped toilet? No   Cognitive Function:    Mini-Cog - 06/09/22 0824     Normal clock drawing test? yes    How many words correct? 3                  Immunizations Immunization History  Administered Date(s) Administered   PFIZER(Purple Top)SARS-COV-2 Vaccination 11/01/2019, 11/20/2019   Pneumococcal Conjugate-13 01/10/2016   Pneumococcal Polysaccharide-23 01/12/2007, 01/12/2017   Td 10/13/2005   Tdap 01/04/2015   Zoster Recombinat (Shingrix) 02/03/2020   Zoster, Live 01/12/2007, 07/20/2012    TDAP status: Up to date  Flu Vaccine status: Up to date  Pneumococcal vaccine status: Up to date  Covid-19 vaccine status: Completed vaccines  Qualifies for Shingles Vaccine? Yes   Zostavax completed Yes   Shingrix Completed?: Yes  Screening Tests Health Maintenance  Topic Date Due   OPHTHALMOLOGY EXAM  Never done   COLONOSCOPY  (Pts 45-49yr Insurance coverage will need to be confirmed)  01/26/2021   INFLUENZA VACCINE  05/13/2022   FOOT EXAM  05/27/2022   HEMOGLOBIN A1C  06/08/2022   Zoster Vaccines- Shingrix (2 of 2) 09/09/2022 (Originally 03/30/2020)   TETANUS/TDAP  01/03/2025   Pneumonia Vaccine 71 Years old  Completed   Hepatitis C Screening  Completed   HPV VACCINES  Aged Out   COVID-19 Vaccine  Discontinued    Health Maintenance  Health Maintenance Due  Topic Date Due   OPHTHALMOLOGY EXAM  Never done   COLONOSCOPY (Pts 45-422yrInsurance coverage will need to be confirmed)  01/26/2021   INFLUENZA VACCINE  05/13/2022   FOOT EXAM  05/27/2022   HEMOGLOBIN A1C  06/08/2022    Colorectal cancer screening: Type of screening: Colonoscopy. Completed 2023. Repeat every tbd years  Lung Cancer Screening: (Low Dose CT Chest recommended if Age 71-80ears, 30 pack-year currently smoking OR have quit w/in 15years.) does not qualify.   Lung Cancer Screening Referral: n/a  Additional Screening:  Hepatitis C Screening: does qualify; Completed 02/2021  Vision Screening: Recommended annual ophthalmology exams for early detection of glaucoma and other disorders of the eye. Is the patient up to date with their annual eye exam?  Yes  Who is the provider or what is the name of the office in which the patient attends annual eye exams? [facility] If pt is not established with a provider, would they like to be referred to a provider to establish care? Yes .   Dental Screening: Recommended annual dental exams for proper oral hygiene  Community Resource Referral / Chronic Care Management: CRR required this visit?  Yes   CCM required this visit?  Yes      Plan:      Problem List Items Addressed This Visit       Cardiovascular and Mediastinum   Hypertension   Relevant Orders   Comprehensive metabolic panel   CBC     Endocrine   Type 2 diabetes mellitus with other specified complication ([facility]  Relevant  Orders   Hemoglobin A1c   Hyperlipidemia associated with type 2 diabetes mellitus ([facility]  Relevant Orders   Lipid panel     Other   History of prostate cancer   Relevant Orders   PSA, Medicare   Other Visit Diagnoses     Encounter for Medicare annual wellness exam    -  Primary   Screening for prostate cancer       Relevant Orders   PSA, Medicare       I have personally reviewed and noted the following in the patient's chart:   Medical and social history Use of alcohol, tobacco or illicit drugs  Current medications and supplements including opioid prescriptions. Patient is not currently taking opioid prescriptions. Functional ability and status Nutritional status Physical activity Advanced directives List of other physicians Hospitalizations, surgeries, and ER visits in previous 12 months Vitals Screenings to include cognitive, depression, and falls Referrals and appointments  In addition, I have reviewed and discussed with patient certain preventive protocols, quality metrics, and best practice recommendations. A written personalized care plan for preventive services as well as general preventive health recommendations were provided to patient.     JeLesleigh NoeMD   06/09/2022

## 2022-06-10 ENCOUNTER — Telehealth: Payer: Self-pay | Admitting: Family Medicine

## 2022-06-10 NOTE — Telephone Encounter (Signed)
Pt returned call regarding message below.    Loreen Freud, CMA  06/10/2022  4:29 PM EDT Back to Top    Left detailed VM (DPR) relayed lab results, per Dr. Marcille Buffy, MD  06/09/2022  3:39 PM EDT     Please let pt know that he is now in the prediabetes range! Keep up healthy diet and exercise.    PSA is 0 - great!   Cholesterol is stable. Liver and kidney function look good.   Made pt aware of cody's message. No further questions.

## 2022-06-11 ENCOUNTER — Encounter: Payer: Self-pay | Admitting: Family Medicine

## 2022-06-24 ENCOUNTER — Other Ambulatory Visit: Payer: Self-pay | Admitting: Family Medicine

## 2022-07-22 ENCOUNTER — Encounter: Payer: Medicare Other | Admitting: Nurse Practitioner

## 2022-08-01 DIAGNOSIS — H401123 Primary open-angle glaucoma, left eye, severe stage: Secondary | ICD-10-CM | POA: Diagnosis not present

## 2022-08-01 DIAGNOSIS — H401111 Primary open-angle glaucoma, right eye, mild stage: Secondary | ICD-10-CM | POA: Diagnosis not present

## 2022-08-29 DIAGNOSIS — H401111 Primary open-angle glaucoma, right eye, mild stage: Secondary | ICD-10-CM | POA: Diagnosis not present

## 2022-08-29 DIAGNOSIS — H401123 Primary open-angle glaucoma, left eye, severe stage: Secondary | ICD-10-CM | POA: Diagnosis not present

## 2022-10-20 DIAGNOSIS — L821 Other seborrheic keratosis: Secondary | ICD-10-CM | POA: Diagnosis not present

## 2022-10-20 DIAGNOSIS — L814 Other melanin hyperpigmentation: Secondary | ICD-10-CM | POA: Diagnosis not present

## 2022-10-20 DIAGNOSIS — D225 Melanocytic nevi of trunk: Secondary | ICD-10-CM | POA: Diagnosis not present

## 2022-10-20 DIAGNOSIS — Z85820 Personal history of malignant melanoma of skin: Secondary | ICD-10-CM | POA: Diagnosis not present

## 2022-10-20 DIAGNOSIS — D1801 Hemangioma of skin and subcutaneous tissue: Secondary | ICD-10-CM | POA: Diagnosis not present

## 2022-11-03 ENCOUNTER — Ambulatory Visit (INDEPENDENT_AMBULATORY_CARE_PROVIDER_SITE_OTHER): Payer: Medicare Other | Admitting: Internal Medicine

## 2022-11-03 ENCOUNTER — Encounter: Payer: Self-pay | Admitting: Internal Medicine

## 2022-11-03 VITALS — BP 128/82 | HR 70 | Temp 97.6°F | Ht 65.5 in | Wt 224.0 lb

## 2022-11-03 DIAGNOSIS — I10 Essential (primary) hypertension: Secondary | ICD-10-CM | POA: Diagnosis not present

## 2022-11-03 DIAGNOSIS — E1169 Type 2 diabetes mellitus with other specified complication: Secondary | ICD-10-CM | POA: Diagnosis not present

## 2022-11-03 DIAGNOSIS — E1159 Type 2 diabetes mellitus with other circulatory complications: Secondary | ICD-10-CM

## 2022-11-03 DIAGNOSIS — N5231 Erectile dysfunction following radical prostatectomy: Secondary | ICD-10-CM | POA: Diagnosis not present

## 2022-11-03 DIAGNOSIS — G5631 Lesion of radial nerve, right upper limb: Secondary | ICD-10-CM

## 2022-11-03 LAB — POCT GLYCOSYLATED HEMOGLOBIN (HGB A1C): Hemoglobin A1C: 7.4 % — AB (ref 4.0–5.6)

## 2022-11-03 LAB — HM DIABETES FOOT EXAM

## 2022-11-03 MED ORDER — METFORMIN HCL ER 500 MG PO TB24
500.0000 mg | ORAL_TABLET | Freq: Every day | ORAL | 3 refills | Status: DC
Start: 2022-11-03 — End: 2023-11-06

## 2022-11-03 MED ORDER — TADALAFIL 20 MG PO TABS: 10.0000 mg | ORAL_TABLET | ORAL | 11 refills | Status: DC | PRN

## 2022-11-03 NOTE — Assessment & Plan Note (Signed)
Lab Results  Component Value Date   HGBA1C 7.4 (A) 11/03/2022   Control is much worse--though not terrible Discussed meds---I think jardiance would be a good choice, but money an issue Will start metformin ER '500mg'$  daily Referral for counseling Discussed statins---he prefers to hold off for now

## 2022-11-03 NOTE — Progress Notes (Signed)
Subjective:    Patient ID: Mason Stewart, male    DOB: 01/23/1951, 72 y.o.   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     cyanocobalamin 1000 MCG tablet Take 1 tablet by mouth daily.     diphenhydrAMINE (BENADRYL) 12.5 MG chewable tablet Chew 12.5 mg by mouth at bedtime.     dorzolamide-timolol (COSOPT) 22.3-6.8 MG/ML ophthalmic solution Place 1 drop into the right eye 2 (two) times daily.     latanoprost (XALATAN) 0.005 % ophthalmic solution Place 1 drop into the right eye at bedtime.     losartan-hydrochlorothiazide (HYZAAR) 50-12.5 MG tablet TAKE 1 TABLET BY MOUTH EVERY DAY 90 tablet 1   Multiple Vitamin (MULTI-VITAMIN) tablet Take 1 tablet by mouth daily.     omeprazole (PRILOSEC OTC) 20 MG tablet Take 20 mg by mouth as needed.      tadalafil (CIALIS) 10 MG tablet Take 1 tablet (10 mg total) by mouth daily as needed for erectile dysfunction. 30 tablet 1   No current facility-administered medications on file prior to visit.    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 Cornett, MD;  Location: [place];  Service: General;  Laterality: Right;   [place]   lower back   colonscopy  2012   EXCISION MELANOMA WITH SENTINEL LYMPH NODE BIOPSY Right 11/23/2013   Procedure: EXCISION MELANOMA WITH SENTINEL LYMPH NODE BIOPSY;  Surgeon: Joyice Faster. Cornett, MD;  Location: [place];  Service: General;  Laterality: Right;   left middle finger surgery after injury  yrs ago   [place]  06/07/2012   Procedure: ROBOTIC ASSISTED LAPAROSCOPIC RADICAL PROSTATECTOMY LEVEL 2;  Surgeon: Dutch Gray, MD;  Location: [facility];  Service: Urology;  Laterality: N/A;        Family History  Problem Relation Age of Onset   Heart disease Mother    Cancer Father        mesthelioma   Glaucoma Father    Hypertension Sister    Hypertension Brother    Arrhythmia Brother  Birth defects Son        Batton's disease   Glaucoma Sister     Social History   Socioeconomic History   Marital status: Married    Spouse name: Mariann Laster   Number of children: 3   Years of education: some college   Highest education level: Not on file  Occupational History   Not on file  Tobacco Use   Smoking status: Never    Passive exposure: Never   Smokeless tobacco: Never  Vaping Use   Vaping Use: Never used  Substance and Sexual Activity   Alcohol use: Yes    Comment: 1-2 times a week, 1 drink   Drug use: No   Sexual activity: Yes    Birth control/protection: Post-menopausal  Other Topics Concern   Not on file  Social History Narrative   05/03/20   From:  [place]   Living: with wife Mariann Laster [phone]) and adult daughter   Work: roofing business       Family: Verlene Mayer and Marylynn Pearson (also has one son who has passed away) - 2 grandchildren      Enjoys: work on cars, travel, vacation      Exercise: climbing Academic librarian and working   Diet: country cooking - eat a lot of Teaching laboratory technician belts: Yes    Guns: daughter is a Cytogeneticist in relationships: Yes    Social Determinants of Radio broadcast assistant Strain: Not on Art therapist Insecurity: Not on file  Transportation Needs: Not on file  Physical Activity: Not on file  Stress: Not on file  Social Connections: Not on file  Intimate Partner Violence: Not on file   Review of Systems Not a great sleeper---up to void (since prostate surgery) History of melanoma in 2015--nodes removed, no chemo, etc  Does keep up with derm    Objective:   Physical Exam Constitutional:      Appearance: Normal appearance.  Cardiovascular:     Rate and Rhythm: Normal rate and regular rhythm.     Pulses: Normal pulses.     Heart sounds: No murmur heard.    No gallop.  Pulmonary:     Effort: Pulmonary effort is normal.     Breath sounds: Normal breath sounds. No wheezing or rales.  Musculoskeletal:     Cervical back: Neck supple.  Lymphadenopathy:     Cervical: No cervical adenopathy.  Skin:    Comments: No foot lesions  Neurological:     Mental Status: He is alert.     Comments: Normal sensation in feet  Psychiatric:        Mood and Affect: Mood normal.        Behavior: Behavior normal.            Assessment & Plan:

## 2022-11-03 NOTE — Assessment & Plan Note (Signed)
BP Readings from Last 3 Encounters:  11/03/22 128/82  06/09/22 110/78  12/09/21 126/78   Doing well on losartan/HCTZ 50/12.5 daily

## 2022-11-03 NOTE — Assessment & Plan Note (Signed)
Likely related to his persistent hammer work Discussed using nail gun, gloves, etc Refer to neuro if worsens

## 2022-11-19 ENCOUNTER — Other Ambulatory Visit: Payer: Self-pay

## 2022-11-19 MED ORDER — LOSARTAN POTASSIUM-HCTZ 50-12.5 MG PO TABS: 1.0000 | ORAL_TABLET | Freq: Every day | ORAL | 1 refills | Status: DC

## 2022-11-19 NOTE — Telephone Encounter (Signed)
[facility] [address] faxed refill request for losartan HCTZ 50-12.5 mg. Pt had TOC with Dr Beverlee Nims on 11/03/22. Per protocol refilled losartan HCTZ 50-12.5 mg # 90 x 1 with instructions take 1 daily po to [facility]

## 2022-12-04 ENCOUNTER — Ambulatory Visit: Payer: Medicare Other | Admitting: Dietician

## 2022-12-10 ENCOUNTER — Ambulatory Visit: Payer: Medicare Other | Admitting: Nurse Practitioner

## 2022-12-22 DIAGNOSIS — H40033 Anatomical narrow angle, bilateral: Secondary | ICD-10-CM | POA: Diagnosis not present

## 2022-12-22 DIAGNOSIS — H401123 Primary open-angle glaucoma, left eye, severe stage: Secondary | ICD-10-CM | POA: Diagnosis not present

## 2022-12-22 DIAGNOSIS — H401111 Primary open-angle glaucoma, right eye, mild stage: Secondary | ICD-10-CM | POA: Diagnosis not present

## 2022-12-22 DIAGNOSIS — Z9842 Cataract extraction status, left eye: Secondary | ICD-10-CM | POA: Diagnosis not present

## 2023-01-26 DIAGNOSIS — G5603 Carpal tunnel syndrome, bilateral upper limbs: Secondary | ICD-10-CM | POA: Diagnosis not present

## 2023-02-18 DIAGNOSIS — G5601 Carpal tunnel syndrome, right upper limb: Secondary | ICD-10-CM | POA: Diagnosis not present

## 2023-02-25 DIAGNOSIS — M79641 Pain in right hand: Secondary | ICD-10-CM | POA: Diagnosis not present

## 2023-02-25 DIAGNOSIS — G5601 Carpal tunnel syndrome, right upper limb: Secondary | ICD-10-CM | POA: Diagnosis not present

## 2023-03-11 DIAGNOSIS — G5601 Carpal tunnel syndrome, right upper limb: Secondary | ICD-10-CM | POA: Diagnosis not present

## 2023-03-14 HISTORY — PX: CARPAL TUNNEL RELEASE: SHX101

## 2023-03-25 DIAGNOSIS — S6411XD Injury of median nerve at wrist and hand level of right arm, subsequent encounter: Secondary | ICD-10-CM | POA: Diagnosis not present

## 2023-03-25 DIAGNOSIS — R29898 Other symptoms and signs involving the musculoskeletal system: Secondary | ICD-10-CM | POA: Diagnosis not present

## 2023-03-30 DIAGNOSIS — R29898 Other symptoms and signs involving the musculoskeletal system: Secondary | ICD-10-CM | POA: Diagnosis not present

## 2023-03-30 DIAGNOSIS — S6411XD Injury of median nerve at wrist and hand level of right arm, subsequent encounter: Secondary | ICD-10-CM | POA: Diagnosis not present

## 2023-04-13 DIAGNOSIS — S6411XD Injury of median nerve at wrist and hand level of right arm, subsequent encounter: Secondary | ICD-10-CM | POA: Diagnosis not present

## 2023-04-13 DIAGNOSIS — R29898 Other symptoms and signs involving the musculoskeletal system: Secondary | ICD-10-CM | POA: Diagnosis not present

## 2023-04-20 DIAGNOSIS — L814 Other melanin hyperpigmentation: Secondary | ICD-10-CM | POA: Diagnosis not present

## 2023-04-20 DIAGNOSIS — D225 Melanocytic nevi of trunk: Secondary | ICD-10-CM | POA: Diagnosis not present

## 2023-04-20 DIAGNOSIS — D2372 Other benign neoplasm of skin of left lower limb, including hip: Secondary | ICD-10-CM | POA: Diagnosis not present

## 2023-04-20 DIAGNOSIS — Z85820 Personal history of malignant melanoma of skin: Secondary | ICD-10-CM | POA: Diagnosis not present

## 2023-04-20 DIAGNOSIS — L570 Actinic keratosis: Secondary | ICD-10-CM | POA: Diagnosis not present

## 2023-04-20 DIAGNOSIS — L821 Other seborrheic keratosis: Secondary | ICD-10-CM | POA: Diagnosis not present

## 2023-04-27 DIAGNOSIS — H401123 Primary open-angle glaucoma, left eye, severe stage: Secondary | ICD-10-CM | POA: Diagnosis not present

## 2023-04-27 DIAGNOSIS — H401111 Primary open-angle glaucoma, right eye, mild stage: Secondary | ICD-10-CM | POA: Diagnosis not present

## 2023-04-28 DIAGNOSIS — S6411XD Injury of median nerve at wrist and hand level of right arm, subsequent encounter: Secondary | ICD-10-CM | POA: Diagnosis not present

## 2023-04-28 DIAGNOSIS — R29898 Other symptoms and signs involving the musculoskeletal system: Secondary | ICD-10-CM | POA: Diagnosis not present

## 2023-04-30 DIAGNOSIS — E1139 Type 2 diabetes mellitus with other diabetic ophthalmic complication: Secondary | ICD-10-CM | POA: Diagnosis not present

## 2023-04-30 DIAGNOSIS — Z7984 Long term (current) use of oral hypoglycemic drugs: Secondary | ICD-10-CM | POA: Diagnosis not present

## 2023-04-30 DIAGNOSIS — H401123 Primary open-angle glaucoma, left eye, severe stage: Secondary | ICD-10-CM | POA: Diagnosis not present

## 2023-04-30 DIAGNOSIS — K219 Gastro-esophageal reflux disease without esophagitis: Secondary | ICD-10-CM | POA: Diagnosis not present

## 2023-04-30 DIAGNOSIS — L905 Scar conditions and fibrosis of skin: Secondary | ICD-10-CM | POA: Diagnosis not present

## 2023-04-30 DIAGNOSIS — Z79899 Other long term (current) drug therapy: Secondary | ICD-10-CM | POA: Diagnosis not present

## 2023-05-25 ENCOUNTER — Ambulatory Visit (INDEPENDENT_AMBULATORY_CARE_PROVIDER_SITE_OTHER): Payer: Medicare Other | Admitting: Internal Medicine

## 2023-05-25 ENCOUNTER — Encounter: Payer: Self-pay | Admitting: Internal Medicine

## 2023-05-25 DIAGNOSIS — Z0000 Encounter for general adult medical examination without abnormal findings: Secondary | ICD-10-CM

## 2023-05-25 NOTE — Progress Notes (Unsigned)
 Hearing Screening - Comments:: Passed whisper test Vision Screening - Comments:: July 2024

## 2023-05-26 DIAGNOSIS — Z0000 Encounter for general adult medical examination without abnormal findings: Secondary | ICD-10-CM | POA: Insufficient documentation

## 2023-05-26 NOTE — Progress Notes (Signed)
   Subjective:    Patient ID: Mason Stewart, male    DOB: 1951/05/24, 72 y.o.   MRN: [id number]  HPI Exam rescheduled   Review of Systems     Objective:   Physical Exam         Assessment & Plan:

## 2023-05-26 NOTE — Assessment & Plan Note (Signed)
Too early Will reschedule

## 2023-06-01 DIAGNOSIS — H401111 Primary open-angle glaucoma, right eye, mild stage: Secondary | ICD-10-CM | POA: Diagnosis not present

## 2023-06-11 ENCOUNTER — Encounter: Payer: Self-pay | Admitting: Internal Medicine

## 2023-06-11 ENCOUNTER — Ambulatory Visit (INDEPENDENT_AMBULATORY_CARE_PROVIDER_SITE_OTHER): Payer: Medicare Other | Admitting: Internal Medicine

## 2023-06-11 VITALS — BP 128/82 | HR 77 | Temp 98.0°F | Ht 65.25 in | Wt 198.0 lb

## 2023-06-11 DIAGNOSIS — K219 Gastro-esophageal reflux disease without esophagitis: Secondary | ICD-10-CM | POA: Diagnosis not present

## 2023-06-11 DIAGNOSIS — Z0000 Encounter for general adult medical examination without abnormal findings: Secondary | ICD-10-CM

## 2023-06-11 DIAGNOSIS — Z7984 Long term (current) use of oral hypoglycemic drugs: Secondary | ICD-10-CM | POA: Diagnosis not present

## 2023-06-11 DIAGNOSIS — E1159 Type 2 diabetes mellitus with other circulatory complications: Secondary | ICD-10-CM | POA: Diagnosis not present

## 2023-06-11 DIAGNOSIS — E119 Type 2 diabetes mellitus without complications: Secondary | ICD-10-CM | POA: Insufficient documentation

## 2023-06-11 DIAGNOSIS — I10 Essential (primary) hypertension: Secondary | ICD-10-CM

## 2023-06-11 DIAGNOSIS — Z8546 Personal history of malignant neoplasm of prostate: Secondary | ICD-10-CM | POA: Diagnosis not present

## 2023-06-11 LAB — HEMOGLOBIN A1C: Hgb A1c MFr Bld: 5.3 % (ref 4.6–6.5)

## 2023-06-11 LAB — CBC
HCT: 46.2 % (ref 39.0–52.0)
Hemoglobin: 15.7 g/dL (ref 13.0–17.0)
MCHC: 33.9 g/dL (ref 30.0–36.0)
MCV: 90 fl (ref 78.0–100.0)
Platelets: 280 10*3/uL (ref 150.0–400.0)
RBC: 5.14 Mil/uL (ref 4.22–5.81)
RDW: 13.6 % (ref 11.5–15.5)
WBC: 9.3 10*3/uL (ref 4.0–10.5)

## 2023-06-11 LAB — COMPREHENSIVE METABOLIC PANEL
ALT: 17 U/L (ref 0–53)
AST: 20 U/L (ref 0–37)
Albumin: 4.4 g/dL (ref 3.5–5.2)
Alkaline Phosphatase: 65 U/L (ref 39–117)
BUN: 15 mg/dL (ref 6–23)
CO2: 28 mEq/L (ref 19–32)
Calcium: 9.7 mg/dL (ref 8.4–10.5)
Chloride: 102 mEq/L (ref 96–112)
Creatinine, Ser: 0.98 mg/dL (ref 0.40–1.50)
GFR: 77.07 mL/min (ref >60.00)
Glucose, Bld: 109 mg/dL — ABNORMAL HIGH (ref 70–99)
Potassium: 4.5 mEq/L (ref 3.5–5.1)
Sodium: 137 mEq/L (ref 135–145)
Total Bilirubin: 1.6 mg/dL — ABNORMAL HIGH (ref 0.2–1.2)
Total Protein: 7.1 g/dL (ref 6.0–8.3)

## 2023-06-11 LAB — MICROALBUMIN / CREATININE URINE RATIO
Creatinine,U: 59.4 mg/dL
Microalb Creat Ratio: 1.2 mg/g (ref 0.0–30.0)
Microalb, Ur: <0.7 mg/dL (ref 0.0–1.9)

## 2023-06-11 LAB — LIPID PANEL
Cholesterol: 161 mg/dL (ref 0–200)
HDL: 40.5 mg/dL (ref >39.00)
LDL Cholesterol: 105 mg/dL — ABNORMAL HIGH (ref 0–99)
NonHDL: 120.15
Total CHOL/HDL Ratio: 4
Triglycerides: 74 mg/dL (ref 0.0–149.0)
VLDL: 14.8 mg/dL (ref 0.0–40.0)

## 2023-06-11 LAB — HM DIABETES FOOT EXAM

## 2023-06-11 LAB — PSA: PSA: 0.02 ng/mL — ABNORMAL LOW (ref 0.10–4.00)

## 2023-06-11 MED ORDER — ROSUVASTATIN CALCIUM 10 MG PO TABS: 10.0000 mg | ORAL_TABLET | ORAL | 3 refills | Status: DC

## 2023-06-11 NOTE — Assessment & Plan Note (Signed)
Will check PSA 

## 2023-06-11 NOTE — Assessment & Plan Note (Signed)
Controlled with omeprazole 20mg  daily Okay to skip days if symptoms controlled

## 2023-06-11 NOTE — Progress Notes (Signed)
Subjective:    Patient ID: Mason Stewart, male    DOB: 1950/12/05, 72 y.o.   MRN: [id number]  HPI Here for Medicare wellness visit and follow up of chronic health conditions Reviewed advanced directives Reviewed other doctors---Dr McKinnon--ophthal, Dr Judithe Modest, Dr Glenna Fellows surgeon, Dr Hyacinth Meeker --ortho, Dr Haskel Schroeder, Had left eye surgery for glaucoma--redone 6 weeks ago, and laser on right eye Had carpal tunnel surgery about 3 months ago No hospitalizations Vision is failing some Hearing is not great---chronic tinnitus. Has cut out noise at work --mostly supervisory Still works---not really exercising otherwise Occasional beer/spirits No tobacco No falls No depression or anhedonia Independent with instrumenta ADLs No memory problems  Not really checking sugars Stopped drinking soft drinks and cut back other sugar Has lost 25# Is taking the metformin  No chest pain or SOB No dizziness or syncope No headaches No palpitations--but can feel it at times. No change in decades No edema  Takes the omeprazole daily now No heartburn or dysphagia --unless he skips several days in a row  Current Outpatient Medications on File Prior to Visit  Medication Sig Dispense Refill   brimonidine (ALPHAGAN) 0.2 % ophthalmic solution Place 1 drop into the right eye 2 (two) times daily.     cyanocobalamin 1000 MCG tablet Take 1 tablet by mouth daily.     diphenhydrAMINE (BENADRYL) 12.5 MG chewable tablet Chew 12.5 mg by mouth at bedtime.     dorzolamide-timolol (COSOPT) 22.3-6.8 MG/ML ophthalmic solution Place 1 drop into the right eye 2 (two) times daily.     latanoprost (XALATAN) 0.005 % ophthalmic solution Place 1 drop into the right eye at bedtime.     losartan-hydrochlorothiazide (HYZAAR) 50-12.5 MG tablet Take 1 tablet by mouth daily. 90 tablet 1   metFORMIN (GLUCOPHAGE-XR) 500 MG 24 hr tablet Take 1 tablet (500 mg total) by mouth daily with breakfast. 100 tablet 3    Multiple Vitamin (MULTI-VITAMIN) tablet Take 1 tablet by mouth daily.     omeprazole (PRILOSEC OTC) 20 MG tablet Take 20 mg by mouth as needed.      tadalafil (CIALIS) 20 MG tablet Take 0.5-1 tablets (10-20 mg total) by mouth every other day as needed for erectile dysfunction. 10 tablet 11   No current facility-administered medications on file prior to visit.    No Known Allergies  Past Medical History:  Diagnosis Date   Cancer [facility])    prostate cancer   GERD (gastroesophageal reflux disease)    Glaucoma (increased eye pressure)    History of prostate cancer 12/12/2013   Hypertension    Melanoma ([facility])    Melanoma of thoracic region ([facility]) 12/12/2013   Metastasis to lymph nodes ([facility]) 12/12/2013   Metastasis to lymph nodes ([facility]) 12/12/2013   Sleep apnea    stopbang =7-has not had a study   Wound dehiscence 12/12/2013    Past Surgical History:  Procedure Laterality Date   AXILLARY LYMPH NODE DISSECTION Right 12/13/2013   Procedure: AXILLARY LYMPH NODE DISSECTION;  Surgeon: Maisie Fus A. Cornett, MD;  Location: [facility];  Service: General;  Laterality: Right;   BACK SURGERY  10/14/1995   lower back   CARPAL TUNNEL RELEASE Right 03/14/2023   colonscopy  10/13/2010   EXCISION MELANOMA WITH SENTINEL LYMPH NODE BIOPSY Right 11/23/2013   Procedure: EXCISION MELANOMA WITH SENTINEL LYMPH NODE BIOPSY;  Surgeon: Maisie Fus A. Cornett, MD;  Location: [facility];  Service: General;  Laterality: Right;   left middle finger surgery after  injury  yrs ago   ROBOT ASSISTED LAPAROSCOPIC RADICAL PROSTATECTOMY  06/07/2012   Procedure: ROBOTIC ASSISTED LAPAROSCOPIC RADICAL PROSTATECTOMY LEVEL 2;  Surgeon: Crecencio Mc, MD;  [facility];  Service: Urology;  Laterality: N/A;        Family History  Problem Relation Age of Onset   Heart disease Mother    Cancer Father        mesthelioma   Glaucoma Father    Hypertension Sister    Hypertension Brother    Arrhythmia Brother     Birth defects Son        Batton's disease   Glaucoma Sister     Social History   Socioeconomic History   Marital status: Married    Spouse name: Burna Mortimer   Number of children: 3   Years of education: some college   Highest education level: Not on file  Occupational History   Occupation: Roofing--now Merchandiser, retail  Tobacco Use   Smoking status: Never    Passive exposure: Never   Smokeless tobacco: Never  Vaping Use   Vaping status: Never Used  Substance and Sexual Activity   Alcohol use: Yes    Comment: 1-2 times a week, 1 drink   Drug use: No   Sexual activity: Yes    Birth control/protection: Post-menopausal  Other Topics Concern   Not on file  Social History Narrative   05/03/20   From: [place]   Living: with wife Burna Mortimer [phone]) and adult daughter   Work: roofing business       Family: Wynonia Sours and Dorna Mai (also has one son who has passed away) - 2 grandchildren      Enjoys: work on cars, travel, vacation      Exercise: climbing Paediatric nurse and working   Diet: country cooking - eat a lot of veggies       No living will    Wife should be decision maker--alternate is daughters   Would accept resuscitation   Would accept tube feeds--temporary   Social Determinants of Corporate investment banker Strain: Not on file  Food Insecurity: Not on file  Transportation Needs: Not on file  Physical Activity: Not on file  Stress: Not on file  Social Connections: Not on file  Intimate Partner Violence: Not on file   Review of Systems Appetite is fine  Sleeps fair--takes 1/2 benedryl (helps itching)--discussed trying claritin/zyrtec Wears seat belt Teeth are okay--keeps up with dentist No suspicious skin lesions now--sees derm regularly Hand is better---after CTS surgery No other sig back or joint issues now    Objective:   Physical Exam Constitutional:      Appearance: Normal appearance.  HENT:     Mouth/Throat:     Pharynx: No oropharyngeal exudate or  posterior oropharyngeal erythema.  Eyes:     Conjunctiva/sclera: Conjunctivae normal.     Pupils: Pupils are equal, round, and reactive to light.  Cardiovascular:     Rate and Rhythm: Normal rate and regular rhythm.     Pulses: Normal pulses.     Heart sounds: No murmur heard.    No gallop.  Pulmonary:     Effort: Pulmonary effort is normal.     Breath sounds: Normal breath sounds. No wheezing or rales.  Abdominal:     Palpations: Abdomen is soft.     Tenderness: There is no abdominal tenderness.  Musculoskeletal:     Cervical back: Neck supple.     Right lower leg: No edema.  Left lower leg: No edema.  Lymphadenopathy:     Cervical: No cervical adenopathy.  Skin:    Findings: No lesion or rash.     Comments: No foot lesions  Neurological:     General: No focal deficit present.     Mental Status: He is alert and oriented to person, place, and time.     Comments: Word naming ---9/1 minute Recall-- 3/3 Normal sensation in feet  Psychiatric:        Mood and Affect: Mood normal.        Behavior: Behavior normal.            Assessment & Plan:

## 2023-06-11 NOTE — Progress Notes (Signed)
Hearing Screening - Comments:: Passed Whisper Test Vision Screening - Comments:: July 2024

## 2023-06-11 NOTE — Assessment & Plan Note (Signed)
Has lost 25# from cutting out sugar Should be much better on metformin 500 daily Will add trwice a week rosuvastatin 10

## 2023-06-11 NOTE — Assessment & Plan Note (Signed)
I have personally reviewed the Medicare Annual Wellness questionnaire and have noted 1. The patient's medical and social history 2. Their use of alcohol, tobacco or illicit drugs 3. Their current medications and supplements 4. The patient's functional ability including ADL's, fall risks, home safety risks and hearing or visual             impairment. 5. Diet and physical activities 6. Evidence for depression or mood disorders  The patients weight, height, BMI and visual acuity have been recorded in the chart I have made referrals, counseling and provided education to the patient based review of the above and I have provided the pt with a written personalized care plan for preventive services.  I have provided you with a copy of your personalized plan for preventive services. Please take the time to review along with your updated medication list.  Will consider one last screening colon-- 4-6 years (had 1 polyp) Stays active Prefers no vaccines

## 2023-06-11 NOTE — Assessment & Plan Note (Signed)
BP Readings from Last 3 Encounters:  06/11/23 128/82  11/03/22 128/82  06/09/22 110/78   Controlled with losartan/hydrochlorothiazide 50/12.5

## 2023-07-03 ENCOUNTER — Other Ambulatory Visit: Payer: Self-pay | Admitting: Internal Medicine

## 2023-08-03 DIAGNOSIS — H401123 Primary open-angle glaucoma, left eye, severe stage: Secondary | ICD-10-CM | POA: Diagnosis not present

## 2023-08-03 DIAGNOSIS — H401111 Primary open-angle glaucoma, right eye, mild stage: Secondary | ICD-10-CM | POA: Diagnosis not present

## 2023-10-13 ENCOUNTER — Encounter: Payer: Self-pay | Admitting: Family Medicine

## 2023-10-13 ENCOUNTER — Ambulatory Visit (INDEPENDENT_AMBULATORY_CARE_PROVIDER_SITE_OTHER): Payer: Medicare Other | Admitting: Family Medicine

## 2023-10-13 VITALS — BP 134/76 | HR 75 | Temp 97.9°F | Ht 65.5 in | Wt 197.1 lb

## 2023-10-13 DIAGNOSIS — E1159 Type 2 diabetes mellitus with other circulatory complications: Secondary | ICD-10-CM

## 2023-10-13 DIAGNOSIS — U071 COVID-19: Secondary | ICD-10-CM | POA: Insufficient documentation

## 2023-10-13 DIAGNOSIS — I10 Essential (primary) hypertension: Secondary | ICD-10-CM

## 2023-10-13 DIAGNOSIS — Z7984 Long term (current) use of oral hypoglycemic drugs: Secondary | ICD-10-CM

## 2023-10-13 MED ORDER — NIRMATRELVIR/RITONAVIR (PAXLOVID)TABLET: 3.0000 | ORAL_TABLET | Freq: Two times a day (BID) | ORAL | 0 refills | Status: AC

## 2023-10-13 NOTE — Patient Instructions (Addendum)
 For COVID infection:  Drink plenty of fluids and get rest.  May take vit C 500mg  bid, vit D 2000 IU daily, zinc 100mg  daily, tylenol  as needed.  Take full dose paxlovid  for 5 days - sent to pharmacy   Paxlovid  drug interactions:  Rosuvastatin  (Crestor ) - hold while on paxlovid   [email or web address] [email or web address] - may sign up for assistance if medicine is uaffordable.

## 2023-10-13 NOTE — Assessment & Plan Note (Addendum)
 Reviewed currently approved antiviral treatments.  Reviewed expected course of illness, anticipated course of recovery, as well as red flags to suggest COVID pneumonia and/or to seek urgent in-person care. Reviewed CDC isolation/quarantine guidelines.  Encouraged fluids and rest. Reviewed further supportive care measures at home including vit C 500mg  bid, vit D 2000 IU daily, zinc 100mg  daily, tylenol  PRN, pepcid  20mg  BID PRN.   Recommend:  Full dose paxlovid   Paxlovid  drug interactions:  Rosuvastatin  - hold while on paxlovid   [email or web address]

## 2023-10-13 NOTE — Progress Notes (Signed)
 Ph: [phone] Fax: [phone]   Patient ID: Mason Stewart, male    DOB: December 23, 1950, 72 y.o.   MRN: [id number]  This visit was conducted in person.  BP 134/76   Pulse 75   Temp 97.9 F (36.6 C) (Oral)   Ht 5' 5.5 (1.664 m)   Wt 197 lb 2 oz (89.4 kg)   SpO2 97%   BMI 32.30 kg/m    CC: COVID infection Subjective:   HPI: Mason Stewart is a 72 y.o. male presenting on 10/13/2023 for Covid Positive (C/o cough, ST, chills, body aches and fatigue. Sxs started 10/10/23. Pos home Covid test- 10/12/23.)   Has had cough for the past month.  First day of symptoms: 10/10/2023 - acute deterioration Tested COVID positive: 10/12/2023  Current symptoms: cough, ST, chills, body aches, fatigue, wheezing with cough  No: fever, dyspnea, nausea, abd pain, diarrhea, chest pain.  Wife also sick at home.  Treatments to date: OTC remedies including tylenol  , dayquil  Risk factors include: age, gender, HTN, T2DM  COVID vaccination status: x 2, no boosters  Last GFR 77 (05/2023)      Relevant past medical, surgical, family and social history reviewed and updated as indicated. Interim medical history since our last visit reviewed. Allergies and medications reviewed and updated. Outpatient Medications Prior to Visit  Medication Sig Dispense Refill   cyanocobalamin 1000 MCG tablet Take 1 tablet by mouth daily.     diphenhydrAMINE  (BENADRYL ) 12.5 MG chewable tablet Chew 12.5 mg by mouth at bedtime.     dorzolamide -timolol  (COSOPT ) 22.3-6.8 MG/ML ophthalmic solution Place 1 drop into the right eye 2 (two) times daily.     latanoprost  (XALATAN ) 0.005 % ophthalmic solution Place 1 drop into the right eye at bedtime.     losartan -hydrochlorothiazide (HYZAAR) 50-12.5 MG tablet TAKE 1 TABLET BY MOUTH EVERY DAY 90 tablet 1   metFORMIN  (GLUCOPHAGE -XR) 500 MG 24 hr tablet Take 1 tablet (500 mg total) by mouth daily with breakfast. 100 tablet 3   Multiple Vitamin (MULTI-VITAMIN) tablet Take 1  tablet by mouth daily.     omeprazole  (PRILOSEC  OTC) 20 MG tablet Take 20 mg by mouth as needed.      rosuvastatin  (CRESTOR ) 10 MG tablet Take 1 tablet (10 mg total) by mouth 2 (two) times a week. 26 tablet 3   tadalafil  (CIALIS ) 20 MG tablet Take 0.5-1 tablets (10-20 mg total) by mouth every other day as needed for erectile dysfunction. 10 tablet 11   brimonidine (ALPHAGAN) 0.2 % ophthalmic solution Place 1 drop into the right eye 2 (two) times daily.     No facility-administered medications prior to visit.     Per HPI unless specifically indicated in ROS section below Review of Systems  Objective:  BP 134/76   Pulse 75   Temp 97.9 F (36.6 C) (Oral)   Ht 5' 5.5 (1.664 m)   Wt 197 lb 2 oz (89.4 kg)   SpO2 97%   BMI 32.30 kg/m   Wt Readings from Last 3 Encounters:  10/13/23 197 lb 2 oz (89.4 kg)  06/11/23 198 lb (89.8 kg)  11/03/22 224 lb (101.6 kg)      Physical Exam Vitals and nursing note reviewed.  Constitutional:      Appearance: Normal appearance. He is not ill-appearing.  HENT:     Head: Normocephalic and atraumatic.     Right Ear: Hearing, tympanic membrane, ear canal and external ear normal. There is no impacted cerumen.  Left Ear: Hearing, tympanic membrane, ear canal and external ear normal. There is no impacted cerumen.     Nose: No mucosal edema.     Right Turbinates: Not enlarged or swollen.     Left Turbinates: Not enlarged or swollen.     Right Sinus: No maxillary sinus tenderness or frontal sinus tenderness.     Left Sinus: No maxillary sinus tenderness or frontal sinus tenderness.     Mouth/Throat:     Comments: Wearing mask Eyes:     Extraocular Movements: Extraocular movements intact.     Conjunctiva/sclera: Conjunctivae normal.     Pupils: Pupils are equal, round, and reactive to light.  Cardiovascular:     Rate and Rhythm: Normal rate and regular rhythm.     Pulses: Normal pulses.     Heart sounds: Normal heart sounds. No murmur  heard. Pulmonary:     Effort: Pulmonary effort is normal. No respiratory distress.     Breath sounds: Normal breath sounds. No wheezing, rhonchi or rales.  Musculoskeletal:     Cervical back: Normal range of motion and neck supple. No rigidity.     Right lower leg: No edema.     Left lower leg: No edema.  Lymphadenopathy:     Cervical: No cervical adenopathy.  Skin:    General: Skin is warm and dry.     Findings: No rash.  Neurological:     Mental Status: He is alert.  Psychiatric:        Mood and Affect: Mood normal.        Behavior: Behavior normal.       Lab Results  Component Value Date   NA 137 06/11/2023   CL 102 06/11/2023   K 4.5 06/11/2023   CO2 28 06/11/2023   BUN 15 06/11/2023   CREATININE 0.98 06/11/2023   GFR 77.07 06/11/2023   CALCIUM  9.7 06/11/2023   ALBUMIN 4.4 06/11/2023   GLUCOSE 109 (H) 06/11/2023    Lab Results  Component Value Date   HGBA1C 5.3 06/11/2023    Assessment & Plan:   Problem List Items Addressed This Visit     Hypertension   Diabetes mellitus with circulatory complication, without long-term current use of insulin  (HCC)   COVID-19 virus infection - Primary   Reviewed currently approved antiviral treatments.  Reviewed expected course of illness, anticipated course of recovery, as well as red flags to suggest COVID pneumonia and/or to seek urgent in-person care. Reviewed CDC isolation/quarantine guidelines.  Encouraged fluids and rest. Reviewed further supportive care measures at home including vit C 500mg  bid, vit D 2000 IU daily, zinc 100mg  daily, tylenol  PRN, pepcid  20mg  BID PRN.   Recommend:  Full dose paxlovid   Paxlovid  drug interactions:  Rosuvastatin  - hold while on paxlovid   [email or web address]       Relevant Medications   nirmatrelvir /ritonavir  (PAXLOVID ) 20 x 150 MG & 10 x 100MG  TABS     Meds ordered this encounter  Medications   nirmatrelvir /ritonavir  (PAXLOVID ) 20 x 150 MG & 10 x 100MG  TABS    Sig: Take 3  tablets by mouth 2 (two) times daily for 5 days. (Take nirmatrelvir  150 mg two tablets twice daily for 5 days and ritonavir  100 mg one tablet twice daily for 5 days) Patient GFR is 77    Dispense:  30 tablet    Refill:  0    No orders of the defined types were placed in this encounter.   Patient Instructions  For COVID infection:  Drink plenty of fluids and get rest.  May take vit C 500mg  bid, vit D 2000 IU daily, zinc 100mg  daily, tylenol  as needed.  Take full dose paxlovid  for 5 days - sent to pharmacy   Paxlovid  drug interactions:  Rosuvastatin  (Crestor ) - hold while on paxlovid   [email or web address] [email or web address] - may sign up for assistance if medicine is uaffordable.  Follow up plan: Return if symptoms worsen or fail to improve.  Anton Blas, MD

## 2023-11-06 ENCOUNTER — Other Ambulatory Visit: Payer: Self-pay | Admitting: Internal Medicine

## 2023-12-14 ENCOUNTER — Ambulatory Visit (INDEPENDENT_AMBULATORY_CARE_PROVIDER_SITE_OTHER): Payer: Medicare Other | Admitting: Internal Medicine

## 2023-12-14 ENCOUNTER — Encounter: Payer: Self-pay | Admitting: Internal Medicine

## 2023-12-14 VITALS — BP 112/76 | HR 75 | Temp 98.4°F | Ht 65.5 in | Wt 200.0 lb

## 2023-12-14 DIAGNOSIS — I10 Essential (primary) hypertension: Secondary | ICD-10-CM | POA: Diagnosis not present

## 2023-12-14 DIAGNOSIS — E1159 Type 2 diabetes mellitus with other circulatory complications: Secondary | ICD-10-CM | POA: Diagnosis not present

## 2023-12-14 DIAGNOSIS — R053 Chronic cough: Secondary | ICD-10-CM

## 2023-12-14 DIAGNOSIS — Z7984 Long term (current) use of oral hypoglycemic drugs: Secondary | ICD-10-CM

## 2023-12-14 DIAGNOSIS — U099 Post covid-19 condition, unspecified: Secondary | ICD-10-CM | POA: Insufficient documentation

## 2023-12-14 DIAGNOSIS — E119 Type 2 diabetes mellitus without complications: Secondary | ICD-10-CM

## 2023-12-14 LAB — POCT GLYCOSYLATED HEMOGLOBIN (HGB A1C): Hemoglobin A1C: 5.4 % (ref 4.0–5.6)

## 2023-12-14 MED ORDER — BENZONATATE 200 MG PO CAPS: 200.0000 mg | ORAL_CAPSULE | Freq: Three times a day (TID) | ORAL | 1 refills | Status: DC | PRN

## 2023-12-14 MED ORDER — ALBUTEROL SULFATE HFA 108 (90 BASE) MCG/ACT IN AERS: 2.0000 | INHALATION_SPRAY | Freq: Four times a day (QID) | RESPIRATORY_TRACT | 0 refills | Status: DC | PRN

## 2023-12-14 NOTE — Progress Notes (Signed)
 Subjective:    Patient ID: Mason Stewart, male    DOB: January 20, 1951, 73 y.o.   MRN: [id number]  HPI Here for follow up of diabetes and other chronic health conditions  Had COVID in January--still with lingering cough So bad it can causes sore chest Over the counter syrups help a little No SOB Worse if lying down No history of asthma  Not checking sugars Some cheating on sodas with the stomach virus--otherwise careful with diet Weight stable Still works--stays active No chest pain Rare sense of heart flutter---no racing No dizziness or syncope  Continues with eye doctor--[facility] Pressures fine  Current Outpatient Medications on File Prior to Visit  Medication Sig Dispense Refill   cyanocobalamin 1000 MCG tablet Take 1 tablet by mouth daily.     dorzolamide-timolol (COSOPT) 22.3-6.8 MG/ML ophthalmic solution Place 1 drop into the right eye 2 (two) times daily.     latanoprost (XALATAN) 0.005 % ophthalmic solution Place 1 drop into the right eye at bedtime.     losartan-hydrochlorothiazide (HYZAAR) 50-12.5 MG tablet TAKE 1 TABLET BY MOUTH EVERY DAY 90 tablet 1   metFORMIN (GLUCOPHAGE-XR) 500 MG 24 hr tablet TAKE 1 TABLET BY MOUTH EVERY DAY WITH BREAKFAST 100 tablet 3   Multiple Vitamin (MULTI-VITAMIN) tablet Take 1 tablet by mouth daily.     omeprazole (PRILOSEC OTC) 20 MG tablet Take 20 mg by mouth as needed.      rosuvastatin (CRESTOR) 10 MG tablet Take 1 tablet (10 mg total) by mouth 2 (two) times a week. 26 tablet 3   tadalafil (CIALIS) 20 MG tablet Take 0.5-1 tablets (10-20 mg total) by mouth every other day as needed for erectile dysfunction. 10 tablet 11   No current facility-administered medications on file prior to visit.    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 Cornett, MD;  Location: [facility];  Service: General;  Laterality: Right;   BACK SURGERY  10/14/1995   lower back   CARPAL TUNNEL RELEASE Right 03/14/2023   colonscopy  10/13/2010   EXCISION MELANOMA WITH SENTINEL LYMPH NODE BIOPSY Right 11/23/2013   Procedure: EXCISION MELANOMA WITH SENTINEL LYMPH NODE BIOPSY;  Surgeon: Maisie Fus A. Cornett, MD;  Location: [facility];  Service: General;  Laterality: Right;   left middle finger surgery after injury  yrs ago   ROBOT ASSISTED LAPAROSCOPIC RADICAL PROSTATECTOMY  06/07/2012   Procedure: ROBOTIC ASSISTED LAPAROSCOPIC RADICAL PROSTATECTOMY LEVEL 2;  Surgeon: Crecencio Mc, MD;  Location: [facility];  Service: Urology;  Laterality: N/A;        Family History  Problem Relation Age of Onset   Heart disease Mother    Cancer Father        mesthelioma   Glaucoma Father    Hypertension Sister    Hypertension Brother  Arrhythmia Brother    Birth defects Son        Batton's disease   Glaucoma Sister     Social History   Socioeconomic History   Marital status: Married    Spouse name: Burna Mortimer   Number of children: 3   Years of education: some college   Highest education level: Not on file  Occupational History   Occupation: Web designer  Tobacco Use   Smoking status: Never    Passive exposure: Never   Smokeless tobacco: Never  Vaping Use   Vaping status: Never Used  Substance and Sexual Activity   Alcohol use: Yes    Comment: 1-2 times a week, 1 drink   Drug use: No   Sexual activity: Yes    Birth  control/protection: Post-menopausal  Other Topics Concern   Not on file  Social History Narrative   05/03/20   From: [place]   Living: with wife Burna Mortimer [phone]) and adult daughter   Work: roofing business       Family: Wynonia Sours and Dorna Mai (also has one son who has passed away) - 2 grandchildren      Enjoys: work on cars, travel, vacation      Exercise: climbing Paediatric nurse and working   Diet: country cooking - eat a lot of veggies       No living will    Wife should be decision maker--alternate is daughters   Would accept resuscitation   Would accept tube feeds--temporary   Social Drivers of Corporate investment banker Strain: Not on file  Food Insecurity: Not on file  Transportation Needs: Not on file  Physical Activity: Not on file  Stress: Not on file  Social Connections: Not on file  Intimate Partner Violence: Not on file   Review of Systems Stomach virus last week--finally better from that Sleeps fairly well--did stop the benedryl (melatonin 5mg  helps some)    Objective:   Physical Exam Constitutional:      Appearance: Normal appearance.  Cardiovascular:     Rate and Rhythm: Normal rate and regular rhythm.     Pulses: Normal pulses.     Heart sounds: No murmur heard.    No gallop.  Pulmonary:     Effort: Pulmonary effort is normal.     Breath sounds: No rales.     Comments: Slight exp wheeze but not really tight Musculoskeletal:     Cervical back: Neck supple.     Right lower leg: No edema.     Left lower leg: No edema.  Lymphadenopathy:     Cervical: No cervical adenopathy.  Skin:    Comments: No foot lesions  Neurological:     Mental Status: He is alert.            Assessment & Plan:

## 2023-12-14 NOTE — Assessment & Plan Note (Signed)
 Lab Results  Component Value Date   HGBA1C 5.4 12/14/2023   Still good control on the metformin 500mg  daily

## 2023-12-14 NOTE — Assessment & Plan Note (Signed)
 BP Readings from Last 3 Encounters:  12/14/23 112/76  10/13/23 134/76  06/11/23 128/82   Controlled with losartan/hydrochlorothiazide 50/12.5

## 2023-12-14 NOTE — Assessment & Plan Note (Signed)
 Element of bronchospasm Prefers no steroid --with glaucoma and diabetes Try albuterol inhaler tessalon

## 2024-01-05 ENCOUNTER — Other Ambulatory Visit: Payer: Self-pay | Admitting: Internal Medicine

## 2024-01-06 ENCOUNTER — Other Ambulatory Visit: Payer: Self-pay | Admitting: Internal Medicine

## 2024-01-20 ENCOUNTER — Ambulatory Visit: Payer: Self-pay

## 2024-01-20 NOTE — Telephone Encounter (Addendum)
 Called and spoke to pt. Advised him that it would be our preference that he go to the ER to be evaluated. But, if he will not do that, he can come see Dr Alphonsus Sias tomorrow with the agreement that if he worsens before that visit, he will go to the ER. He stated he was not really worried about it. He is doing this to appease his wife. Made him an appt 01-21-24 at 930.

## 2024-01-20 NOTE — Telephone Encounter (Addendum)
 Rerouted to correct office.   Copied from CRM [id number]. Topic: Appointments - Appointment Scheduling >> Jan 20, 2024  2:04 PM Caliyah H wrote: Patient called in regarding dysphagia and numbness in his face and mouth for the past two weeks. Callback number [phone]  Chief Complaint: Difficulty swallowing Symptoms: Facial numbness, coughing, difficulty breathing Frequency: 2 weeks Pertinent Negatives: Patient denies relief Disposition: [x] ED /[] Urgent Care (no appt availability in office) / [] Appointment(In office/virtual)/ []  [facility]/ [] Home Care/ [x] Refused Recommended Disposition /[] [facility]/ []  Follow-up with PCP Additional Notes: Patient called in to report difficulty swallowing and left jaw numbness. Patient stated symptoms started 2 weeks ago and have been worsening. Patient stated that he is getting "easily choked up" and cannot put too much food/drink into his mouth at one time. Patient also reported coughing and difficulty breathing r/t post-COVID symptoms. Patient denied the following: numbness of extremities, changes in speech and facial drooping. Advised ED at this time, due to protocol. Patient declined. Patient would like to be seen in office, if possible, and would like a call back. Please advise. Called [facility] to notify of ED refusal.   Reason for Disposition  Patient sounds very sick or weak to the triager  Answer Assessment - Initial Assessment Questions 1. SYMPTOM: "What is the main symptom you are concerned about?" (e.g., weakness, numbness)     Difficulty swallowing, left side of jaw is numb 2. ONSET: "When did this start?" (minutes, hours, days; while sleeping)     Ongoing for a couple of weeks, worsening now, states symptoms started after starting albuterol inhaler on 12/14/23 3. LAST NORMAL: "When was the last time you (the patient) were normal (no symptoms)?"     Patient stated he got COVID "the first of the year", has been having symptoms  since, but difficulty swallowing and facial numbness started within past 2 weeks 4. PATTERN "Does this come and go, or has it been constant since it started?"  "Is it present now?"     Constant and worsening 5. CARDIAC SYMPTOMS: "Have you had any of the following symptoms: chest pain, difficulty breathing, palpitations?"     N/A 6. NEUROLOGIC SYMPTOMS: "Have you had any of the following symptoms: headache, dizziness, vision loss, double vision, changes in speech, unsteady on your feet?"     Denies numbness in extremities, states he has vision problems at baseline, denies changes in speech, denies facial drooping 7. OTHER SYMPTOMS: "Do you have any other symptoms?"     Coughing and difficulty breathing, states his throat feels "clogged up"- cannot put too much in mouth at one time, states it is "easy to get choked"  Protocols used: Neurologic Deficit-A-AH

## 2024-01-20 NOTE — Telephone Encounter (Signed)
 Copied from CRM [id number]. Topic: General - Other >> Jan 20, 2024  3:03 PM Alcus Dad wrote: Reason for CRM: Cyndi Lennert nurse stated patient refused ED.

## 2024-01-21 ENCOUNTER — Encounter: Payer: Self-pay | Admitting: Internal Medicine

## 2024-01-21 ENCOUNTER — Ambulatory Visit (INDEPENDENT_AMBULATORY_CARE_PROVIDER_SITE_OTHER): Admitting: Internal Medicine

## 2024-01-21 VITALS — BP 138/84 | HR 71 | Temp 97.9°F | Ht 65.5 in | Wt 197.0 lb

## 2024-01-21 DIAGNOSIS — R1312 Dysphagia, oropharyngeal phase: Secondary | ICD-10-CM | POA: Diagnosis not present

## 2024-01-21 DIAGNOSIS — R1310 Dysphagia, unspecified: Secondary | ICD-10-CM | POA: Insufficient documentation

## 2024-01-21 DIAGNOSIS — R200 Anesthesia of skin: Secondary | ICD-10-CM | POA: Diagnosis not present

## 2024-01-21 NOTE — Assessment & Plan Note (Signed)
 Seems to be in V1 distribution bilaterally No other cranial nerve abnormalities No temporal bruits May need to refer to neurology

## 2024-01-21 NOTE — Patient Instructions (Signed)
 Please increase the omeprazole to twice a day--on empty stomach. If the ENT doctor doesn't find anything, the next step is a GI evaluation Let me know if anything changes with the facial numnbess

## 2024-01-21 NOTE — Assessment & Plan Note (Signed)
 Seems more localized to laryngeal area Will set up with ENT for direct laryngoscopy Double omeprazole to 20 bid If persists, then would send to GI (Dr Leone Payor)

## 2024-01-21 NOTE — Telephone Encounter (Signed)
 I don't see any visit notes---I will assess him at the visit today

## 2024-01-21 NOTE — Progress Notes (Signed)
 Subjective:    Patient ID: Mason Stewart, male    DOB: 1951/03/14, 73 y.o.   MRN: [id number]  HPI Here due to facial numbness and trouble swallowing  About 3 weeks ago--started noticing some difficulty swallowing Food stops at the top of his throat--will eventually go down with extra water Happens with food/water/pills No heartburn--on the omeprazole (or rare, with greasy food) Did have slight choke once---coughed up everything okay Seems worse with the ongoing post COVID cough (stable)  Has feeling like something is on his nose Some numbness across frontal and maxillary areas bilaterally Might be related to the coughing  Did try "Ultimate Spike Detox"--but since these symptoms started  Current Outpatient Medications on File Prior to Visit  Medication Sig Dispense Refill   benzonatate (TESSALON) 200 MG capsule Take 1 capsule (200 mg total) by mouth 3 (three) times daily as needed for cough. 60 capsule 1   cyanocobalamin 1000 MCG tablet Take 1 tablet by mouth daily.     dorzolamide-timolol (COSOPT) 22.3-6.8 MG/ML ophthalmic solution Place 1 drop into the right eye 2 (two) times daily.     latanoprost (XALATAN) 0.005 % ophthalmic solution Place 1 drop into the right eye at bedtime.     losartan-hydrochlorothiazide (HYZAAR) 50-12.5 MG tablet TAKE 1 TABLET BY MOUTH EVERY DAY 90 tablet 1   metFORMIN (GLUCOPHAGE-XR) 500 MG 24 hr tablet TAKE 1 TABLET BY MOUTH EVERY DAY WITH BREAKFAST 100 tablet 3   Multiple Vitamin (MULTI-VITAMIN) tablet Take 1 tablet by mouth daily.     omeprazole (PRILOSEC OTC) 20 MG tablet Take 20 mg by mouth as needed.      rosuvastatin (CRESTOR) 10 MG tablet Take 1 tablet (10 mg total) by mouth 2 (two) times a week. 26 tablet 3   tadalafil (CIALIS) 20 MG tablet Take 0.5-1 tablets (10-20 mg total) by mouth every other day as needed for erectile dysfunction. 10 tablet 11   No current facility-administered medications on file prior to visit.    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 Cornett, MD;  Location: [facility];  Service: General;  Laterality: Right;   BACK SURGERY  10/14/1995   lower back   CARPAL TUNNEL RELEASE Right 03/14/2023   colonscopy  10/13/2010   EXCISION MELANOMA WITH SENTINEL LYMPH NODE BIOPSY Right 11/23/2013   Procedure: EXCISION MELANOMA WITH SENTINEL LYMPH NODE BIOPSY;  Surgeon: Maisie Fus A. Cornett, MD;  Location: [facility];  Service: General;  Laterality: Right;   left middle finger surgery after injury  yrs ago   ROBOT ASSISTED LAPAROSCOPIC RADICAL PROSTATECTOMY  06/07/2012   Procedure: ROBOTIC ASSISTED LAPAROSCOPIC RADICAL PROSTATECTOMY LEVEL 2;  Surgeon: Crecencio Mc, MD;  Location: [facility];  Service: Urology;  Laterality: N/A;        Family History  Problem Relation Age of Onset  Heart disease Mother    Cancer Father        mesthelioma   Glaucoma Father    Hypertension Sister    Hypertension Brother    Arrhythmia Brother    Birth defects Son        Batton's disease   Glaucoma Sister     Social History   Socioeconomic History   Marital status: Married    Spouse name: Burna Mortimer   Number of children: 3   Years of education: some college   Highest education level: Not on file  Occupational History   Occupation: Roofing--now Merchandiser, retail  Tobacco Use   Smoking status: Never    Passive  exposure: Never   Smokeless tobacco: Never  Vaping Use   Vaping status: Never Used  Substance and Sexual Activity   Alcohol use: Yes    Comment: 1-2 times a week, 1 drink   Drug use: No   Sexual activity: Yes    Birth control/protection: Post-menopausal  Other Topics Concern   Not on file  Social History Narrative   05/03/20   From: [place]   Living: with wife Burna Mortimer [phone]) and adult daughter   Work: roofing business       Family: Wynonia Sours and Dorna Mai (also has one son who has passed away) - 2 grandchildren      Enjoys: work on cars, travel, vacation      Exercise: climbing Paediatric nurse and working   Diet: country cooking - eat a lot of veggies       No living will    Wife should be decision maker--alternate is daughters   Would accept resuscitation   Would accept tube feeds--temporary   Social Drivers of Corporate investment banker Strain: Not on file  Food Insecurity: Not on file  Transportation Needs: Not on file  Physical Activity: Not on file  Stress: Not on file  Social Connections: Not on file  Intimate Partner Violence: Not on file   Review of Systems Didn't think the albuterol helped---so he stopped it (concerned about possible side effects) Benzonatate does help some Worse when lying down--cough and breathing Some headaches--not new. Throbbing and bifrontal (not really noticeable or an issue)    Objective:   Physical Exam HENT:     Head:     Comments: No temporal bruits Eyes:     Extraocular Movements: Extraocular movements intact.     Pupils: Pupils are equal, round, and reactive to light.  Neurological:     General: No focal deficit present.     Comments: Cranial nerves normal other than decreased sensation in both frontal areas (not mandibular or maxillary though)            Assessment & Plan:

## 2024-01-21 NOTE — Addendum Note (Signed)
 Addended by: Tillman Abide I on: 01/21/2024 10:06 AM   Modules accepted: Level of Service

## 2024-01-25 ENCOUNTER — Telehealth: Payer: Self-pay | Admitting: Internal Medicine

## 2024-01-25 NOTE — Telephone Encounter (Signed)
 Copied from CRM [id number]. Topic: Referral - Question >> Jan 25, 2024  8:49 AM Yolande Hench C wrote: Reason for CRM: Patient has requested a call back from [facility] or Dr. Joelle Musca to discuss the referral#[id number] for Otolaryngology and to discuss some concerns he has; please follow up with patient when available [phone]

## 2024-01-25 NOTE — Telephone Encounter (Signed)
 Spoke to pt. He has not heard from anyone at [facility]. I gave him the phone number to call them.

## 2024-02-01 ENCOUNTER — Emergency Department

## 2024-02-01 ENCOUNTER — Emergency Department
Admission: EM | Admit: 2024-02-01 | Discharge: 2024-02-01 | Disposition: A | Discharge status: Home / Self Care | Attending: Emergency Medicine | Admitting: Emergency Medicine

## 2024-02-01 ENCOUNTER — Encounter: Payer: Self-pay | Admitting: Emergency Medicine

## 2024-02-01 ENCOUNTER — Other Ambulatory Visit: Payer: Self-pay

## 2024-02-01 DIAGNOSIS — R1310 Dysphagia, unspecified: Secondary | ICD-10-CM | POA: Insufficient documentation

## 2024-02-01 DIAGNOSIS — R059 Cough, unspecified: Secondary | ICD-10-CM | POA: Diagnosis present

## 2024-02-01 DIAGNOSIS — I10 Essential (primary) hypertension: Secondary | ICD-10-CM | POA: Diagnosis not present

## 2024-02-01 DIAGNOSIS — R918 Other nonspecific abnormal finding of lung field: Secondary | ICD-10-CM

## 2024-02-01 DIAGNOSIS — R200 Anesthesia of skin: Secondary | ICD-10-CM | POA: Diagnosis not present

## 2024-02-01 DIAGNOSIS — R052 Subacute cough: Secondary | ICD-10-CM | POA: Insufficient documentation

## 2024-02-01 LAB — HEPATIC FUNCTION PANEL
ALT: 17 U/L (ref 0–44)
AST: 21 U/L (ref 15–41)
Albumin: 4.1 g/dL (ref 3.5–5.0)
Alkaline Phosphatase: 52 U/L (ref 38–126)
Bilirubin, Direct: 0.2 mg/dL (ref 0.0–0.2)
Indirect Bilirubin: 1.1 mg/dL — ABNORMAL HIGH (ref 0.3–0.9)
Total Bilirubin: 1.3 mg/dL — ABNORMAL HIGH (ref 0.0–1.2)
Total Protein: 7.4 g/dL (ref 6.5–8.1)

## 2024-02-01 LAB — BASIC METABOLIC PANEL WITH GFR
Anion gap: 10 (ref 5–15)
BUN: 10 mg/dL (ref 8–23)
CO2: 25 mmol/L (ref 22–32)
Calcium: 9.2 mg/dL (ref 8.9–10.3)
Chloride: 99 mmol/L (ref 98–111)
Creatinine, Ser: 0.79 mg/dL (ref 0.61–1.24)
GFR, Estimated: >60 mL/min (ref >60)
Glucose, Bld: 160 mg/dL — ABNORMAL HIGH (ref 70–99)
Potassium: 3.9 mmol/L (ref 3.5–5.1)
Sodium: 134 mmol/L — ABNORMAL LOW (ref 135–145)

## 2024-02-01 LAB — URINALYSIS, ROUTINE W REFLEX MICROSCOPIC
Bilirubin Urine: NEGATIVE
Glucose, UA: 50 mg/dL — AB
Hgb urine dipstick: NEGATIVE
Ketones, ur: NEGATIVE mg/dL
Leukocytes,Ua: NEGATIVE
Nitrite: NEGATIVE
Protein, ur: NEGATIVE mg/dL
Specific Gravity, Urine: 1.013 (ref 1.005–1.030)
pH: 7 (ref 5.0–8.0)

## 2024-02-01 LAB — CBC
HCT: 42.8 % (ref 39.0–52.0)
Hemoglobin: 15.4 g/dL (ref 13.0–17.0)
MCH: 30.9 pg (ref 26.0–34.0)
MCHC: 36 g/dL (ref 30.0–36.0)
MCV: 85.8 fL (ref 80.0–100.0)
Platelets: 287 10*3/uL (ref 150–400)
RBC: 4.99 MIL/uL (ref 4.22–5.81)
RDW: 12.2 % (ref 11.5–15.5)
WBC: 9.2 10*3/uL (ref 4.0–10.5)
nRBC: 0 % (ref 0.0–0.2)

## 2024-02-01 LAB — LIPASE, BLOOD: Lipase: 33 U/L (ref 11–51)

## 2024-02-01 LAB — MAGNESIUM: Magnesium: 2.1 mg/dL (ref 1.7–2.4)

## 2024-02-01 LAB — PHOSPHORUS: Phosphorus: 3 mg/dL (ref 2.5–4.6)

## 2024-02-01 MED ORDER — IOHEXOL 300 MG/ML  SOLN
75.0000 mL | Freq: Once | INTRAMUSCULAR | Status: AC | PRN
Start: 2024-02-01 — End: 2024-02-01
  Administered 2024-02-01: 75 mL via INTRAVENOUS

## 2024-02-01 NOTE — ED Triage Notes (Signed)
 Pt in via POV, w/ multiple complaints.  Reports ongoing cough which has been persistent since having Covid. Also complains of difficulty chewing and swallowing x approximately 1 month w/ some facial numbness, progressing to the point of only being able to swallow liquids over the last 4 days.  Facial numbness is bilateral and patient is intact neurologically at this time.    Has attempted to get in w/ ENT but they could not see him until June.    Ambulatory to triage, able to speak in full sentences, NAD noted at this time.

## 2024-02-01 NOTE — ED Provider Notes (Signed)
 [facility] Provider Note    Event Date/Time   First MD Initiated Contact with Patient 02/01/24 0930     (approximate)   History   Cough, Dysphagia, and Headache   HPI  Mason Stewart is a 73 y.o. male with a history of GERD, hypertension, and proximal cancer who presents with dysphagia, cough, and facial numbness.  The patient states that his symptoms began with a cough that has been present since about January when he had COVID.  It is nonproductive and persistent.  It has not acutely worsened.  He denies any significant shortness of breath.  He has no chest pain or fever.  The dysphagia has developed over approximately the last month.  The patient states that he has difficulty swallowing liquids and solids.  He states the last solids he was able to get down were eggs about 4 days ago.  He states that over the last couple of days he has had difficulty with liquids as well although is able to get them down.  The patient feels like he not only cannot swallow, but cannot chew appropriately.    The patient also reports facial numbness that has similarly developed over the last several weeks and migrated around the face but now seems to be more on the left side including the forehead, upper face, and around his mouth.  He feels like the inside of his mouth is numb as well.  I reviewed the past medical records.  The patient's most recent outpatient counter was with primary care on 4/10 for dysphagia and he was referred to ENT.   Physical Exam   Triage Vital Signs: ED Triage Vitals  Encounter Vitals Group     BP 02/01/24 0911 138/81     Systolic BP Percentile --      Diastolic BP Percentile --      Pulse Rate 02/01/24 0911 73     Resp 02/01/24 0911 17     Temp 02/01/24 0911 (!) 97.4 F (36.3 C)     Temp Source 02/01/24 0911 Oral     SpO2 02/01/24 0911 95 %     Weight 02/01/24 0923 195 lb (88.5 kg)     Height 02/01/24 0923 5\' 6"  (1.676 m)     Head  Circumference --      Peak Flow --      Pain Score 02/01/24 0921 0     Pain Loc --      Pain Education --      Exclude from Growth Chart --     Most recent vital signs: Vitals:   02/01/24 1330 02/01/24 1545  BP: 131/75   Pulse: 71 78  Resp: 18 16  Temp:    SpO2: 100% 97%     General: Awake, no distress.  CV:  Good peripheral perfusion.  Resp:  Normal effort.  Abd:  No distention.  Other:  Oropharynx clear.  Subjective numbness to the face bilaterally, worse on the left, not sparing the forehead.  No facial droop.  Normal speech.  EOMI.  PERRLA.  Motor intact in all extremities.   ED Results / Procedures / Treatments   Labs (all labs ordered are listed, but only abnormal results are displayed) Labs Reviewed  BASIC METABOLIC PANEL WITH GFR - Abnormal; Notable for the following components:      Result Value   Sodium 134 (*)    Glucose, Bld 160 (*)    All other components within normal limits  HEPATIC FUNCTION PANEL - Abnormal; Notable for the following components:   Total Bilirubin 1.3 (*)    Indirect Bilirubin 1.1 (*)    All other components within normal limits  URINALYSIS, ROUTINE W REFLEX MICROSCOPIC - Abnormal; Notable for the following components:   Color, Urine YELLOW (*)    APPearance CLEAR (*)    Glucose, UA 50 (*)    All other components within normal limits  CBC  LIPASE, BLOOD  MAGNESIUM   PHOSPHORUS     EKG     RADIOLOGY  Chest x-ray: I independently viewed and interpreted the images; there is a 7 cm right lung opacity  CT head: No acute abnormality  MR brain: Pending  CT chest: Pending   PROCEDURES:  Critical Care performed: No  Procedures   MEDICATIONS ORDERED IN ED: Medications - No data to display   IMPRESSION / MDM / ASSESSMENT AND PLAN / ED COURSE  I reviewed the triage vital signs and the nursing notes.  73 year old male with PMH as noted above presents with worsening dysphagia and difficulty chewing over the last  several weeks, acutely worsened in the last few days, as well as facial numbness for the last several weeks and cough for a few months.  Differential diagnosis includes, but is not limited to:  Dysphagia: It seems that the patient is having difficulty swallowing and getting food down, but at the same time also has difficulty with chewing.  This is most concerning for neurologic etiology such as a subacute CVA.  Differential includes cranial nerve deficits, esophageal dysmotility.  We will obtain CT head and labs.  I will consult neurology for further recommendations based on these results.  Facial numbness: As above, this is most likely neurologic in etiology but is subacute.  We will start with CT.  Cough: This is chronic for several months.  The patient has no respiratory distress or hypoxia.  It is most consistent with bronchitis likely post-COVID.  We will obtain a chest x-ray for further evaluation.  Patient's presentation is most consistent with acute presentation with potential threat to life or bodily function.  The patient is on the cardiac monitor to evaluate for evidence of arrhythmia and/or significant heart rate changes.  ----------------------------------------- 3:57 PM on 02/01/2024 -----------------------------------------  BMP and CBC are unremarkable.  There are no significant electrolyte abnormalities.  LFTs are also unremarkable.  UA is negative.  The patient is tolerating p.o. liquids without difficulty in the ED.  CT head is negative.  I consulted and discussed the case with Dr. Alecia Ames from neurology who recommends an MRI of the brain for further evaluation although advises that overall the presentation is less consistent with acute CVA.  Chest x-ray also shows a 7 cm mass.  I have ordered a CT for further evaluation.  I have signed the patient out to the oncoming ED physician Dr. Cam Cava.     FINAL CLINICAL IMPRESSION(S) / ED DIAGNOSES   Final diagnoses:   Subacute cough  Dysphagia, unspecified type     Rx / DC Orders   ED Discharge Orders     None        Note:  This document was prepared using Dragon voice recognition software and may include unintentional dictation errors.    Lind Repine, MD 02/01/24 1558

## 2024-02-01 NOTE — ED Notes (Signed)
 Rn entered room and pt was getting out of bed with the assistance of spouse to use the restroom. RN was able to get pt a urine specimen cup and request a sample. Pt has a steady, even gait and did not require assistance from staff or spouse to get to restroom.

## 2024-02-01 NOTE — ED Notes (Signed)
 Called lab about add on lab orders.

## 2024-02-01 NOTE — ED Notes (Signed)
Pt back from xray and CT

## 2024-02-01 NOTE — ED Provider Notes (Signed)
  Physical Exam  BP 133/75 (BP Location: Right Arm)   Pulse 82   Temp 97.8 F (36.6 C) (Oral)   Resp (!) 22   Ht 5\' 6"  (1.676 m)   Wt 88.5 kg   SpO2 97%   BMI 31.47 kg/m   Physical Exam  Procedures  Procedures  ED Course / MDM   Clinical Course as of 02/01/24 1928  Mon Feb 01, 2024  1547 S/o from Dr. Demetrios Finders: - 73M p/w dysphagia x 1 mo, +facial numbness - CTH neg MRI pending -- if MRI neg,   Cxr w/ opacity, mass vs infxn CT chest pending  TO DO: - f/u MRI - f/u CT chest   [MM]  1633 MRI brain with no acute pathology on my interpretation, formal read below: IMPRESSION: Unremarkable appearance of the brain for age.   [MM]  1846 CT Chest: IMPRESSION: 1. Large well-circumscribed solid appearing mass in the right lower lobe with mild surrounding posterior obstructive pneumonitis and central airway thickening. Findings are not typical for pneumonia and are concerning for malignancy. Differential includes primary bronchogenic carcinoma and metastatic melanoma given the patient's history. 2. Newly enlarged right hilar and subcarinal lymph nodes, suspicious for metastatic disease. PET-CT may be helpful for further evaluation. 3. No other evidence of metastatic disease. 4. Hepatic steatosis. 5. Aortic atherosclerosis.   [MM]  1926 Discussed CT findings with patient and her clinical concern for possible malignancy.  Plan for close PMD follow-up.  Pulmonary nodule urgent referral also placed.  Overall suspect his dysphagia and facial numbness for several weeks may have some underlying nerve compression that may be related to malignancy.  ED return precautions in place.  Plan for close outpatient follow-up. [MM]    Clinical Course User Index [MM] Collis Deaner, MD   Medical Decision Making Amount and/or Complexity of Data Reviewed Labs: ordered. Radiology: ordered.  Risk Prescription drug management.          Collis Deaner, MD 02/01/24  [phone]

## 2024-02-01 NOTE — Discharge Instructions (Addendum)
 Your evaluation in the emergency department was notable for a lung mass concerning for possible cancer.  As discussed, I have placed a referral for you to be seen in the pulmonology clinic, and I also recommend you closely follow-up with your primary care doctor to help facilitate outpatient evaluation.  Return to the emergency department with any new or worsening symptoms.

## 2024-02-03 ENCOUNTER — Ambulatory Visit (INDEPENDENT_AMBULATORY_CARE_PROVIDER_SITE_OTHER): Admitting: Student in an Organized Health Care Education/Training Program

## 2024-02-03 ENCOUNTER — Telehealth: Payer: Self-pay

## 2024-02-03 ENCOUNTER — Encounter: Payer: Self-pay | Admitting: Student in an Organized Health Care Education/Training Program

## 2024-02-03 VITALS — BP 110/80 | HR 63 | Temp 98.1°F | Ht 65.0 in | Wt 186.0 lb

## 2024-02-03 DIAGNOSIS — R1310 Dysphagia, unspecified: Secondary | ICD-10-CM | POA: Diagnosis not present

## 2024-02-03 DIAGNOSIS — R911 Solitary pulmonary nodule: Secondary | ICD-10-CM | POA: Diagnosis not present

## 2024-02-03 DIAGNOSIS — Z85118 Personal history of other malignant neoplasm of bronchus and lung: Secondary | ICD-10-CM

## 2024-02-03 DIAGNOSIS — R918 Other nonspecific abnormal finding of lung field: Secondary | ICD-10-CM | POA: Diagnosis not present

## 2024-02-03 NOTE — Progress Notes (Signed)
 Assessment & Plan:   #Right lower lobe lung mass (Primary) #Dysphagia #History of Melanoma  Nodule Location: RLL Nodule Size: 6.7 x 6.0 cm Nodule Spiculation: N/A Associated Lymphadenopathy: Yes Smoking Status (never) and pack years Extrathoracic cancer > 5 years prior (yes): Melanoma ECOG: 1  The patient is here to discuss their imaging abnormalities which include a RLL lung mass that is concerning for malignancy. The differential for this includes recurrent/metastatic melanoma vs primary pulmonary malignancy. The finding of a lymphadenopathy (right hilar and subcarinal) suggests a lung cancer with spread, and this will need to be biopsied to establish a diagnosis. I will order a PET/CT to assess for any signs of distant spread. If there are signs of metastasis we will consider referral to IR for a percutaneous biopsy.  As to the weakness he is experiencing, the differential for this includes Lambert-Eaten syndrome vs a para-neoplastic syndrome. His exam does not show proximal muscle weakness and his reflexes are brisk, suggesting against Lambert-Eaton and favoring a para-neoplastic syndrome. It is also possible that the sub-carinal lymph node is pressing on his esophagus and resulting in dysphagia.  We discussed the importance of diagnosis and staging in lung malignancies, and the approach to obtaining a tissue diagnosis which would include robotic assisted navigational bronchoscopy with endobronchial ultrasound guided sampling.  We also discussed the risks associated with the procedure which include a 2% risk of pneumothorax, infection, bleeding, and nondiagnostic procedure in detail.  I explained that patients typically are able to return home the same day of the procedure, but in rare cases admission to the hospital for observation and treatment is required.  After our discussion, the patient elected to proceed with the procedure  Recommendations: - Procedural/ Surgical Case Request:  ROBOTIC ASSISTED NAVIGATIONAL BRONCHOSCOPY; Future - CT SUPER D CHEST WO CONTRAST; Future - NM PET Image Initial (PI) Skull Base To Thigh (F-18 FDG); Future   I spent 60 minutes caring for this patient today, including preparing to see the patient, obtaining a medical history , reviewing a separately obtained history, performing a medically appropriate examination and/or evaluation, counseling and educating the patient/family/caregiver, ordering medications, tests, or procedures, documenting clinical information in the electronic health record, and independently interpreting results (not separately reported/billed) and communicating results to the patient/family/caregiver  Vergia Glasgow, MD [facility]  End of visit medications:  No orders of the defined types were placed in this encounter.    Current Outpatient Medications:    benzonatate  (TESSALON ) 200 MG capsule, Take 1 capsule (200 mg total) by mouth 3 (three) times daily as needed for cough., Disp: 60 capsule, Rfl: 1   cyanocobalamin 1000 MCG tablet, Take 1 tablet by mouth daily., Disp: , Rfl:    dorzolamide-timolol (COSOPT) 22.3-6.8 MG/ML ophthalmic solution, Place 1 drop into the right eye 2 (two) times daily., Disp: , Rfl:    latanoprost (XALATAN) 0.005 % ophthalmic solution, Place 1 drop into the right eye at bedtime., Disp: , Rfl:    losartan -hydrochlorothiazide (HYZAAR) 50-12.5 MG tablet, TAKE 1 TABLET BY MOUTH EVERY DAY, Disp: 90 tablet, Rfl: 1   metFORMIN  (GLUCOPHAGE -XR) 500 MG 24 hr tablet, TAKE 1 TABLET BY MOUTH EVERY DAY WITH BREAKFAST, Disp: 100 tablet, Rfl: 3   Multiple Vitamin (MULTI-VITAMIN) tablet, Take 1 tablet by mouth daily., Disp: , Rfl:    omeprazole  (PRILOSEC  OTC) 20 MG tablet, Take 20 mg by mouth as needed. , Disp: , Rfl:    omeprazole  (PRILOSEC ) 20 MG capsule, Take 20 mg by  mouth 2 (two) times daily before a meal., Disp: , Rfl:    rosuvastatin  (CRESTOR ) 10 MG tablet, Take 1 tablet (10 mg  total) by mouth 2 (two) times a week., Disp: 26 tablet, Rfl: 3   tadalafil  (CIALIS ) 20 MG tablet, Take 0.5-1 tablets (10-20 mg total) by mouth every other day as needed for erectile dysfunction., Disp: 10 tablet, Rfl: 11   Subjective:   PATIENT ID: Mason Stewart GENDER: male DOB: 08/07/1951, MRN: [id number]  Chief Complaint  Patient presents with   New Patient (Initial Visit)    Ed follow up: abnormal large right lower lobe mass, dysphagia, and facial numbness from possible malignancy, and dry cough. Chest CT and x-ray: 02/01/24  Has been having trouble with headaches, chewing, dry cough -- feels like he aspirates a lot, has resorted to soft foods and drinking, not solids, and all symptoms have come about in the last 2 weeks. Lost 10 lbs in the last week, and 5 in the week before last.     HPI  Patient is a pleasant 73 year old male presenting to clinic for the evaluation of a pulmonary mass.  He reports symptoms of over a month in duration, with report of cough that is at times productive of sputum. He is also reporting a wheeze and rattling sound in his chest that improves cough. His symptoms have progressed, and he is now reporting difficulty swallowing and increased drooling from his mouth. He is also reporting generalized weakness as well as decreased PO intake. Denies shortness of breath, chest pain, or chest tightness.  Patient was seen in the ED on 4/21 secondary to the cough, where a CXR following by a CT scan showed a RLL pulmonary mass for which he is referred to us . A brain MRI was also performed which was negative for any masses or lesions.  Patient's past medical history is notable for prostate cancer in 2013 s/p resection. 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 PET/CT at the time did not show evidence of disease spread or recurrence.  Patient worked in [employer] and  was self employed. He reports some occupational exposures during his line of work. He denies any history of smoking or illicit drug use.  Ancillary information including prior medications, full medical/surgical/family/social histories, and PFTs (when available) are listed below and have been reviewed.   Review of Systems  Constitutional:  Positive for malaise/fatigue and weight loss. Negative for chills and fever.  Respiratory:  Positive for cough, shortness of breath and wheezing. Negative for hemoptysis.   Cardiovascular:  Negative for chest pain.     Objective:   Vitals:   02/03/24 1104  BP: 110/80  Pulse: 63  Temp: 98.1 F (36.7 C)  TempSrc: Oral  SpO2: 95%  Weight: 186 lb (84.4 kg)  Height: 5\' 5"  (1.651 m)   95% on RA BMI Readings from Last 3 Encounters:  02/03/24 30.95 kg/m  02/01/24 31.47 kg/m  01/21/24 32.28 kg/m   Wt Readings from Last 3 Encounters:  02/03/24 186 lb (84.4 kg)  02/01/24 195 lb (88.5 kg)  01/21/24 197 lb (89.4 kg)    Physical Exam Constitutional:      General: He is not in acute distress.    Appearance: Normal appearance. He is ill-appearing.  Cardiovascular:     Rate and Rhythm: Normal rate and regular rhythm.     Pulses: Normal pulses.  Pulmonary:     Effort: Pulmonary effort is normal.     Breath sounds: Wheezing (over the right lower lung field) present. No rales.  Neurological:     General: No focal deficit present.     Mental Status: He is alert and oriented to person, place, and time. Mental status is at baseline.     Cranial Nerves: No cranial nerve deficit.     Sensory: No sensory deficit.     Motor: No weakness.     Coordination: Coordination normal.     Deep Tendon Reflexes: Reflexes normal.       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 Cornett, MD;  Location: [facility];  Service: General;  Laterality: Right;   BACK SURGERY  10/14/1995   lower back   CARPAL TUNNEL RELEASE Right 03/14/2023   colonscopy  10/13/2010   EXCISION MELANOMA WITH SENTINEL LYMPH NODE BIOPSY Right 11/23/2013   Procedure: EXCISION MELANOMA WITH SENTINEL LYMPH NODE BIOPSY;  Surgeon: Andy Bannister A. Cornett, MD;  Location: [facility];  Service: General;  Laterality: Right;   left middle finger surgery after injury  yrs ago   ROBOT ASSISTED LAPAROSCOPIC RADICAL PROSTATECTOMY  06/07/2012   Procedure: ROBOTIC ASSISTED LAPAROSCOPIC RADICAL PROSTATECTOMY LEVEL 2;  Surgeon: Kristeen Peto, MD;  Location: [facility];  Service: Urology;  Laterality: N/A;        Social History   Socioeconomic History   Marital status: Married    Spouse name: Wallene Gum   Number of children: 3   Years of education: some college   Highest education level: Not on file  Occupational History   Occupation: Web designer  Tobacco Use   Smoking status: Never    Passive exposure: Never   Smokeless tobacco: Never  Vaping Use   Vaping status: Never Used  Substance and Sexual Activity   Alcohol use: Yes    Comment: 1-2 times a week, 1 drink   Drug use: No   Sexual activity: Yes   Other Topics Concern   Not on file  Social History Narrative   05/03/20   From: [place]   Living: with wife Wallene Gum [phone]) and adult daughter   Work: roofing business       Family: Benedetta Bradley and Megan Spindle (also has one son who has passed away) - 2 grandchildren      Enjoys: work on cars, travel, vacation  Exercise: climbing ladders and working   Diet: country cooking - eat a lot of veggies       No living will    Wife should be decision maker--alternate is daughters   Would accept resuscitation   Would accept tube feeds--temporary   Social Drivers of Health   Financial Resource Strain: Not on file  Food Insecurity: Not on file  Transportation Needs: Not on file  Physical Activity: Not on file  Stress: Not on file  Social Connections: Not on file  Intimate Partner Violence: Not on file     Allergies  Allergen Reactions   Lisinopril Cough   Pollen Extract Cough     CBC    Component Value Date/Time   WBC 9.2 02/01/2024 0925   RBC 4.99 02/01/2024 0925   HGB 15.4 02/01/2024 0925   HGB 15.5 09/26/2014 0832   HCT 42.8 02/01/2024 0925   HCT 43.2 09/26/2014 0832   PLT 287 02/01/2024 0925   PLT 223 09/26/2014 0832   MCV 85.8 02/01/2024 0925   MCV 86.6 09/26/2014 0832   MCH 30.9 02/01/2024 0925   MCHC 36.0 02/01/2024 0925   RDW 12.2 02/01/2024 0925   RDW 12.8 09/26/2014 0832   LYMPHSABS 1.0 09/26/2014 0832   MONOABS 0.5 09/26/2014 0832   EOSABS 0.2 09/26/2014 0832   BASOSABS 0.0 09/26/2014 9622    Pulmonary Functions Testing Results:     No data to display          Outpatient Medications Prior to Visit  Medication Sig Dispense Refill   benzonatate  (TESSALON ) 200 MG capsule Take 1 capsule (200 mg total) by mouth 3 (three) times daily as needed for cough. 60 capsule 1   cyanocobalamin 1000 MCG tablet Take 1 tablet by mouth daily.     dorzolamide-timolol (COSOPT) 22.3-6.8 MG/ML ophthalmic solution Place 1 drop into the right eye 2 (two) times  daily.     latanoprost (XALATAN) 0.005 % ophthalmic solution Place 1 drop into the right eye at bedtime.     losartan -hydrochlorothiazide (HYZAAR) 50-12.5 MG tablet TAKE 1 TABLET BY MOUTH EVERY DAY 90 tablet 1   metFORMIN  (GLUCOPHAGE -XR) 500 MG 24 hr tablet TAKE 1 TABLET BY MOUTH EVERY DAY WITH BREAKFAST 100 tablet 3   Multiple Vitamin (MULTI-VITAMIN) tablet Take 1 tablet by mouth daily.     omeprazole  (PRILOSEC  OTC) 20 MG tablet Take 20 mg by mouth as needed.      omeprazole  (PRILOSEC ) 20 MG capsule Take 20 mg by mouth 2 (two) times daily before a meal.     rosuvastatin  (CRESTOR ) 10 MG tablet Take 1 tablet (10 mg total) by mouth 2 (two) times a week. 26 tablet 3   tadalafil  (CIALIS ) 20 MG tablet Take 0.5-1 tablets (10-20 mg total) by mouth every other day as needed for erectile dysfunction. 10 tablet 11   No facility-administered medications prior to visit.

## 2024-02-03 NOTE — Telephone Encounter (Signed)
 Robotic Bronchoscopy 02/16/2024 at 9:45am Lung nodule [id number], [id number]  Mason Stewart please see Bronch Info.

## 2024-02-03 NOTE — H&P (View-Only) (Signed)
 Assessment & Plan:   #Right lower lobe lung mass (Primary) #Dysphagia #History of Melanoma  Nodule Location: RLL Nodule Size: 6.7 x 6.0 cm Nodule Spiculation: N/A Associated Lymphadenopathy: Yes Smoking Status (never) and pack years Extrathoracic cancer > 5 years prior (yes): Melanoma ECOG: 1  The patient is here to discuss their imaging abnormalities which include a RLL lung mass that is concerning for malignancy. The differential for this includes recurrent/metastatic melanoma vs primary pulmonary malignancy. The finding of a lymphadenopathy (right hilar and subcarinal) suggests a lung cancer with spread, and this will need to be biopsied to establish a diagnosis. I will order a PET/CT to assess for any signs of distant spread. If there are signs of metastasis we will consider referral to IR for a percutaneous biopsy.  As to the weakness he is experiencing, the differential for this includes Lambert-Eaten syndrome vs a para-neoplastic syndrome. His exam does not show proximal muscle weakness and his reflexes are brisk, suggesting against Lambert-Eaton and favoring a para-neoplastic syndrome. It is also possible that the sub-carinal lymph node is pressing on his esophagus and resulting in dysphagia.  We discussed the importance of diagnosis and staging in lung malignancies, and the approach to obtaining a tissue diagnosis which would include robotic assisted navigational bronchoscopy with endobronchial ultrasound guided sampling.  We also discussed the risks associated with the procedure which include a 2% risk of pneumothorax, infection, bleeding, and nondiagnostic procedure in detail.  I explained that patients typically are able to return home the same day of the procedure, but in rare cases admission to the hospital for observation and treatment is required.  After our discussion, the patient elected to proceed with the procedure  Recommendations: - Procedural/ Surgical Case Request:  ROBOTIC ASSISTED NAVIGATIONAL BRONCHOSCOPY; Future - CT SUPER D CHEST WO CONTRAST; Future - NM PET Image Initial (PI) Skull Base To Thigh (F-18 FDG); Future   I spent 60 minutes caring for this patient today, including preparing to see the patient, obtaining a medical history , reviewing a separately obtained history, performing a medically appropriate examination and/or evaluation, counseling and educating the patient/family/caregiver, ordering medications, tests, or procedures, documenting clinical information in the electronic health record, and independently interpreting results (not separately reported/billed) and communicating results to the patient/family/caregiver  Vergia Glasgow, MD [facility]  End of visit medications:  No orders of the defined types were placed in this encounter.    Current Outpatient Medications:    benzonatate  (TESSALON ) 200 MG capsule, Take 1 capsule (200 mg total) by mouth 3 (three) times daily as needed for cough., Disp: 60 capsule, Rfl: 1   cyanocobalamin 1000 MCG tablet, Take 1 tablet by mouth daily., Disp: , Rfl:    dorzolamide-timolol (COSOPT) 22.3-6.8 MG/ML ophthalmic solution, Place 1 drop into the right eye 2 (two) times daily., Disp: , Rfl:    latanoprost (XALATAN) 0.005 % ophthalmic solution, Place 1 drop into the right eye at bedtime., Disp: , Rfl:    losartan -hydrochlorothiazide (HYZAAR) 50-12.5 MG tablet, TAKE 1 TABLET BY MOUTH EVERY DAY, Disp: 90 tablet, Rfl: 1   metFORMIN  (GLUCOPHAGE -XR) 500 MG 24 hr tablet, TAKE 1 TABLET BY MOUTH EVERY DAY WITH BREAKFAST, Disp: 100 tablet, Rfl: 3   Multiple Vitamin (MULTI-VITAMIN) tablet, Take 1 tablet by mouth daily., Disp: , Rfl:    omeprazole  (PRILOSEC  OTC) 20 MG tablet, Take 20 mg by mouth as needed. , Disp: , Rfl:    omeprazole  (PRILOSEC ) 20 MG capsule, Take 20 mg by  mouth 2 (two) times daily before a meal., Disp: , Rfl:    rosuvastatin  (CRESTOR ) 10 MG tablet, Take 1 tablet (10 mg  total) by mouth 2 (two) times a week., Disp: 26 tablet, Rfl: 3   tadalafil  (CIALIS ) 20 MG tablet, Take 0.5-1 tablets (10-20 mg total) by mouth every other day as needed for erectile dysfunction., Disp: 10 tablet, Rfl: 11   Subjective:   PATIENT ID: Mason Stewart: male DOB: 08/07/1951, MRN: [id number]  Chief Complaint  Patient presents with   New Patient (Initial Visit)    Ed follow up: abnormal large right lower lobe mass, dysphagia, and facial numbness from possible malignancy, and dry cough. Chest CT and x-ray: 02/01/24  Has been having trouble with headaches, chewing, dry cough -- feels like he aspirates a lot, has resorted to soft foods and drinking, not solids, and all symptoms have come about in the last 2 weeks. Lost 10 lbs in the last week, and 5 in the week before last.     HPI  Patient is a pleasant 73 year old male presenting to clinic for the evaluation of a pulmonary mass.  He reports symptoms of over a month in duration, with report of cough that is at times productive of sputum. He is also reporting a wheeze and rattling sound in his chest that improves cough. His symptoms have progressed, and he is now reporting difficulty swallowing and increased drooling from his mouth. He is also reporting generalized weakness as well as decreased PO intake. Denies shortness of breath, chest pain, or chest tightness.  Patient was seen in the ED on 4/21 secondary to the cough, where a CXR following by a CT scan showed a RLL pulmonary mass for which he is referred to us . A brain MRI was also performed which was negative for any masses or lesions.  Patient's past medical history is notable for prostate cancer in 2013 s/p resection. 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 PET/CT at the time did not show evidence of disease spread or recurrence.  Patient worked in [employer] and  was self employed. He reports some occupational exposures during his line of work. He denies any history of smoking or illicit drug use.  Ancillary information including prior medications, full medical/surgical/family/social histories, and PFTs (when available) are listed below and have been reviewed.   Review of Systems  Constitutional:  Positive for malaise/fatigue and weight loss. Negative for chills and fever.  Respiratory:  Positive for cough, shortness of breath and wheezing. Negative for hemoptysis.   Cardiovascular:  Negative for chest pain.     Objective:   Vitals:   02/03/24 1104  BP: 110/80  Pulse: 63  Temp: 98.1 F (36.7 C)  TempSrc: Oral  SpO2: 95%  Weight: 186 lb (84.4 kg)  Height: 5\' 5"  (1.651 m)   95% on RA BMI Readings from Last 3 Encounters:  02/03/24 30.95 kg/m  02/01/24 31.47 kg/m  01/21/24 32.28 kg/m   Wt Readings from Last 3 Encounters:  02/03/24 186 lb (84.4 kg)  02/01/24 195 lb (88.5 kg)  01/21/24 197 lb (89.4 kg)    Physical Exam Constitutional:      General: He is not in acute distress.    Appearance: Normal appearance. He is ill-appearing.  Cardiovascular:     Rate and Rhythm: Normal rate and regular rhythm.     Pulses: Normal pulses.  Pulmonary:     Effort: Pulmonary effort is normal.     Breath sounds: Wheezing (over the right lower lung field) present. No rales.  Neurological:     General: No focal deficit present.     Mental Status: He is alert and oriented to person, place, and time. Mental status is at baseline.     Cranial Nerves: No cranial nerve deficit.     Sensory: No sensory deficit.     Motor: No weakness.     Coordination: Coordination normal.     Deep Tendon Reflexes: Reflexes normal.       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 Cornett, MD;  Location: [facility];  Service: General;  Laterality: Right;   BACK SURGERY  10/14/1995   lower back   CARPAL TUNNEL RELEASE Right 03/14/2023   colonscopy  10/13/2010   EXCISION MELANOMA WITH SENTINEL LYMPH NODE BIOPSY Right 11/23/2013   Procedure: EXCISION MELANOMA WITH SENTINEL LYMPH NODE BIOPSY;  Surgeon: Andy Bannister A. Cornett, MD;  Location: [facility];  Service: General;  Laterality: Right;   left middle finger surgery after injury  yrs ago   ROBOT ASSISTED LAPAROSCOPIC RADICAL PROSTATECTOMY  06/07/2012   Procedure: ROBOTIC ASSISTED LAPAROSCOPIC RADICAL PROSTATECTOMY LEVEL 2;  Surgeon: Kristeen Peto, MD;  Location: [facility];  Service: Urology;  Laterality: N/A;        Social History   Socioeconomic History   Marital status: Married    Spouse name: Wallene Gum   Number of children: 3   Years of education: some college   Highest education level: Not on file  Occupational History   Occupation: Web designer  Tobacco Use   Smoking status: Never    Passive exposure: Never   Smokeless tobacco: Never  Vaping Use   Vaping status: Never Used  Substance and Sexual Activity   Alcohol use: Yes    Comment: 1-2 times a week, 1 drink   Drug use: No   Sexual activity: Yes   Other Topics Concern   Not on file  Social History Narrative   05/03/20   From: [place]   Living: with wife Wallene Gum [phone]) and adult daughter   Work: roofing business       Family: Benedetta Bradley and Megan Spindle (also has one son who has passed away) - 2 grandchildren      Enjoys: work on cars, travel, vacation  Exercise: climbing ladders and working   Diet: country cooking - eat a lot of veggies       No living will    Wife should be decision maker--alternate is daughters   Would accept resuscitation   Would accept tube feeds--temporary   Social Drivers of Health   Financial Resource Strain: Not on file  Food Insecurity: Not on file  Transportation Needs: Not on file  Physical Activity: Not on file  Stress: Not on file  Social Connections: Not on file  Intimate Partner Violence: Not on file     Allergies  Allergen Reactions   Lisinopril Cough   Pollen Extract Cough     CBC    Component Value Date/Time   WBC 9.2 02/01/2024 0925   RBC 4.99 02/01/2024 0925   HGB 15.4 02/01/2024 0925   HGB 15.5 09/26/2014 0832   HCT 42.8 02/01/2024 0925   HCT 43.2 09/26/2014 0832   PLT 287 02/01/2024 0925   PLT 223 09/26/2014 0832   MCV 85.8 02/01/2024 0925   MCV 86.6 09/26/2014 0832   MCH 30.9 02/01/2024 0925   MCHC 36.0 02/01/2024 0925   RDW 12.2 02/01/2024 0925   RDW 12.8 09/26/2014 0832   LYMPHSABS 1.0 09/26/2014 0832   MONOABS 0.5 09/26/2014 0832   EOSABS 0.2 09/26/2014 0832   BASOSABS 0.0 09/26/2014 9622    Pulmonary Functions Testing Results:     No data to display          Outpatient Medications Prior to Visit  Medication Sig Dispense Refill   benzonatate  (TESSALON ) 200 MG capsule Take 1 capsule (200 mg total) by mouth 3 (three) times daily as needed for cough. 60 capsule 1   cyanocobalamin 1000 MCG tablet Take 1 tablet by mouth daily.     dorzolamide-timolol (COSOPT) 22.3-6.8 MG/ML ophthalmic solution Place 1 drop into the right eye 2 (two) times  daily.     latanoprost (XALATAN) 0.005 % ophthalmic solution Place 1 drop into the right eye at bedtime.     losartan -hydrochlorothiazide (HYZAAR) 50-12.5 MG tablet TAKE 1 TABLET BY MOUTH EVERY DAY 90 tablet 1   metFORMIN  (GLUCOPHAGE -XR) 500 MG 24 hr tablet TAKE 1 TABLET BY MOUTH EVERY DAY WITH BREAKFAST 100 tablet 3   Multiple Vitamin (MULTI-VITAMIN) tablet Take 1 tablet by mouth daily.     omeprazole  (PRILOSEC  OTC) 20 MG tablet Take 20 mg by mouth as needed.      omeprazole  (PRILOSEC ) 20 MG capsule Take 20 mg by mouth 2 (two) times daily before a meal.     rosuvastatin  (CRESTOR ) 10 MG tablet Take 1 tablet (10 mg total) by mouth 2 (two) times a week. 26 tablet 3   tadalafil  (CIALIS ) 20 MG tablet Take 0.5-1 tablets (10-20 mg total) by mouth every other day as needed for erectile dysfunction. 10 tablet 11   No facility-administered medications prior to visit.

## 2024-02-03 NOTE — Telephone Encounter (Signed)
Patient has Medicare [employer]& [employer] and [employer] Prior Auth Not Required

## 2024-02-03 NOTE — Telephone Encounter (Signed)
 Patient is aware of date and note.  Nothing further needed.

## 2024-02-04 ENCOUNTER — Encounter
Admission: RE | Admit: 2024-02-04 | Discharge: 2024-02-04 | Disposition: A | Discharge status: Home / Self Care | Source: Ambulatory Visit | Attending: Student in an Organized Health Care Education/Training Program | Admitting: Student in an Organized Health Care Education/Training Program

## 2024-02-04 DIAGNOSIS — R918 Other nonspecific abnormal finding of lung field: Secondary | ICD-10-CM | POA: Diagnosis present

## 2024-02-04 LAB — GLUCOSE, CAPILLARY: Glucose-Capillary: 148 mg/dL — ABNORMAL HIGH (ref 70–99)

## 2024-02-04 MED ORDER — FLUDEOXYGLUCOSE F - 18 (FDG) INJECTION
10.3100 | Freq: Once | INTRAVENOUS | Status: AC | PRN
  Administered 2024-02-04: 10.31 via INTRAVENOUS

## 2024-02-04 NOTE — Addendum Note (Signed)
 Addended by: Syvanna Ciolino on: 02/04/2024 04:04 PM   Modules accepted: Orders

## 2024-02-05 ENCOUNTER — Ambulatory Visit: Admission: RE | Admit: 2024-02-05 | Source: Ambulatory Visit

## 2024-02-05 ENCOUNTER — Ambulatory Visit
Admission: RE | Admit: 2024-02-05 | Discharge: 2024-02-05 | Disposition: A | Discharge status: Home / Self Care | Source: Ambulatory Visit | Attending: Student in an Organized Health Care Education/Training Program | Admitting: Student in an Organized Health Care Education/Training Program

## 2024-02-05 ENCOUNTER — Telehealth: Payer: Self-pay | Admitting: Pulmonary Disease

## 2024-02-05 DIAGNOSIS — R1310 Dysphagia, unspecified: Secondary | ICD-10-CM | POA: Insufficient documentation

## 2024-02-05 DIAGNOSIS — R918 Other nonspecific abnormal finding of lung field: Secondary | ICD-10-CM | POA: Insufficient documentation

## 2024-02-05 MED ORDER — GADOBUTROL 1 MMOL/ML IV SOLN
7.5000 mL | Freq: Once | INTRAVENOUS | Status: AC | PRN
  Administered 2024-02-05: 7.5 mL via INTRAVENOUS

## 2024-02-05 NOTE — Telephone Encounter (Signed)
 [facility] Telephone Encounter  Contacted by Radiology with MR Lumbar results from 02/05/24.   IMPRESSION: 1. Diffuse dural enhancement with nodular enhancement within the conus medullaris. 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 2. 7 mm enhancing nodule laterally at the L4-5 disc level, corresponding to the traversing left L5 nerve roots. 3. No osseous metastases. 4. Multilevel spondylosis of the lumbar spine as described. 5. Mild central canal stenosis at L1-2. 6. Moderate bilateral foraminal stenosis at L1-2. 7. Moderate left and mild right subarticular narrowing at L2-3. 8. Moderate to severe central canal stenosis at L3-4 with severe right and moderate left foraminal stenosis. 9. Moderate foraminal narrowing bilaterally at L4-5 and L5-S1.   His primary pulmonlogist, Dr. Darnelle Elders who ordered the scan was made aware and is familiar with patient. Is planning to contact Neurosurgery and he plans to personally contact patient with results and plan tomorrow.  Genetta Kenning, M.D. [facility] 02/05/2024 8:41 PM   See Amion for personal pager For hours between 7 PM to 7 AM, please call [facility] for urgent questions

## 2024-02-08 ENCOUNTER — Telehealth: Payer: Self-pay | Admitting: Student in an Organized Health Care Education/Training Program

## 2024-02-08 DIAGNOSIS — R918 Other nonspecific abnormal finding of lung field: Secondary | ICD-10-CM

## 2024-02-08 DIAGNOSIS — C7949 Secondary malignant neoplasm of other parts of nervous system: Secondary | ICD-10-CM

## 2024-02-08 NOTE — Telephone Encounter (Signed)
 Mr. Mason Stewart has all 3 scans scheduled on 02/09/24 @ 3:00pm [facility]

## 2024-02-08 NOTE — Telephone Encounter (Signed)
 Called patient and explained result from lumbar spine MRI with need for repeat brain MRI with contrast, cervical/thoracic MRI with contrast, and oncology referral.

## 2024-02-09 ENCOUNTER — Ambulatory Visit
Admission: RE | Admit: 2024-02-09 | Discharge: 2024-02-09 | Disposition: A | Discharge status: Home / Self Care | Source: Ambulatory Visit | Attending: Student in an Organized Health Care Education/Training Program | Admitting: Student in an Organized Health Care Education/Training Program

## 2024-02-09 ENCOUNTER — Ambulatory Visit
Admission: RE | Admit: 2024-02-09 | Discharge: 2024-02-09 | Discharge status: Home / Self Care | Source: Ambulatory Visit | Attending: Student in an Organized Health Care Education/Training Program

## 2024-02-09 DIAGNOSIS — C7949 Secondary malignant neoplasm of other parts of nervous system: Secondary | ICD-10-CM

## 2024-02-09 DIAGNOSIS — R918 Other nonspecific abnormal finding of lung field: Secondary | ICD-10-CM | POA: Insufficient documentation

## 2024-02-09 MED ORDER — GADOBUTROL 1 MMOL/ML IV SOLN
8.0000 mL | Freq: Once | INTRAVENOUS | Status: AC | PRN
  Administered 2024-02-09: 8 mL via INTRAVENOUS

## 2024-02-10 ENCOUNTER — Encounter
Admission: RE | Admit: 2024-02-10 | Discharge: 2024-02-10 | Disposition: A | Discharge status: Home / Self Care | Source: Ambulatory Visit | Attending: Student in an Organized Health Care Education/Training Program | Admitting: Student in an Organized Health Care Education/Training Program

## 2024-02-10 ENCOUNTER — Other Ambulatory Visit: Payer: Self-pay

## 2024-02-10 DIAGNOSIS — I10 Essential (primary) hypertension: Secondary | ICD-10-CM

## 2024-02-10 DIAGNOSIS — E119 Type 2 diabetes mellitus without complications: Secondary | ICD-10-CM

## 2024-02-10 DIAGNOSIS — Z01810 Encounter for preprocedural cardiovascular examination: Secondary | ICD-10-CM | POA: Diagnosis present

## 2024-02-10 DIAGNOSIS — I4510 Unspecified right bundle-branch block: Secondary | ICD-10-CM | POA: Insufficient documentation

## 2024-02-10 DIAGNOSIS — I493 Ventricular premature depolarization: Secondary | ICD-10-CM | POA: Insufficient documentation

## 2024-02-10 HISTORY — DX: Type 2 diabetes mellitus without complications: E11.9

## 2024-02-10 HISTORY — DX: Headache, unspecified: R51.9

## 2024-02-10 NOTE — Patient Instructions (Addendum)
 Your procedure is scheduled on: 02/16/24 - Tuesday Report to the Registration Desk on the 1st floor of the [facility]. To find out your arrival time, please call [phone] between 1PM - 3PM on: 02/15/24 - Monday If your arrival time is 6:00 am, do not arrive before that time as the [facility] entrance doors do not open until 6:00 am.  REMEMBER: Instructions that are not followed completely may result in serious medical risk, up to and including death; or upon the discretion of your surgeon and anesthesiologist your surgery may need to be rescheduled.  Do not eat food or drink any liquids after midnight the night before surgery.  No gum chewing or hard candies.  One week prior to surgery: Stop Anti-inflammatories (NSAIDS) such as Advil, Aleve, Ibuprofen, Motrin, Naproxen, Naprosyn and Aspirin based products such as Excedrin, Goody's Powder, BC Powder. You may take Tylenol  if needed for pain up until the day of surgery.  Stop ANY OVER THE COUNTER supplements until after surgery : Multiple Vitamin ,zinc gluconate .   Hold metFORMIN  (GLUCOPHAGE -XR) 2 days prior to surgery beginning 02/14/24.  HOLD tadalafil  (CIALIS ) beginning 02/14/24.   HOLD losartan -hydrochlorothiazide on the morning of surgery.  ON THE DAY OF SURGERY ONLY TAKE THESE MEDICATIONS WITH SIPS OF WATER :  dorzolamide-timolol (COSOPT)  omeprazole  (PRILOSEC  OTC)  latanoprost (XALATAN)    No Alcohol for 24 hours before or after surgery.  No Smoking including e-cigarettes for 24 hours before surgery.  No chewable tobacco products for at least 6 hours before surgery.  No nicotine patches on the day of surgery.  Do not use any "recreational" drugs for at least a week (preferably 2 weeks) before your surgery.  Please be advised that the combination of cocaine and anesthesia may have negative outcomes, up to and including death. If you test positive for cocaine, your surgery will be cancelled.  On the morning of  surgery brush your teeth with toothpaste and water , you may rinse your mouth with mouthwash if you wish. Do not swallow any toothpaste or mouthwash.  Do not wear jewelry, make-up, hairpins, clips or nail polish.  For welded (permanent) jewelry: bracelets, anklets, waist bands, etc.  Please have this removed prior to surgery.  If it is not removed, there is a chance that hospital personnel will need to cut it off on the day of surgery.  Do not wear lotions, powders, or perfumes.   Do not shave body hair from the neck down 48 hours before surgery.  Contact lenses, hearing aids and dentures may not be worn into surgery.  Do not bring valuables to the hospital. [facility] is not responsible for any missing/lost belongings or valuables.   Notify your doctor if there is any change in your medical condition (cold, fever, infection).  Wear comfortable clothing (specific to your surgery type) to the hospital.  After surgery, you can help prevent lung complications by doing breathing exercises.  Take deep breaths and cough every 1-2 hours. Your doctor may order a device called an Incentive Spirometer to help you take deep breaths.  When coughing or sneezing, hold a pillow firmly against your incision with both hands. This is called "splinting." Doing this helps protect your incision. It also decreases belly discomfort.  If you are being admitted to the hospital overnight, leave your suitcase in the car. After surgery it may be brought to your room.  In case of increased patient census, it may be necessary for you, the patient, to continue  your postoperative care in the Same Day Surgery department.  If you are being discharged the day of surgery, you will not be allowed to drive home. You will need a responsible individual to drive you home and stay with you for 24 hours after surgery.   If you are taking public transportation, you will need to have a responsible individual with you.  Please  call the Pre-admissions Testing Dept. at [phone] if you have any questions about these instructions.  Surgery Visitation Policy:  Patients having surgery or a procedure may have two visitors.  Children under the age of 79 must have an adult with them who is not the patient.  Inpatient Visitation:    Visiting hours are 7 a.m. to 8 p.m. Up to four visitors are allowed at one time in a patient room. The visitors may rotate out with other people during the day.  One visitor age 39 or older may stay with the patient overnight and must be in the room by 8 p.m.

## 2024-02-15 ENCOUNTER — Ambulatory Visit
Admission: RE | Admit: 2024-02-15 | Discharge: 2024-02-15 | Disposition: A | Discharge status: Home / Self Care | Source: Ambulatory Visit | Attending: Student in an Organized Health Care Education/Training Program | Admitting: Student in an Organized Health Care Education/Training Program

## 2024-02-15 DIAGNOSIS — R918 Other nonspecific abnormal finding of lung field: Secondary | ICD-10-CM | POA: Diagnosis present

## 2024-02-15 MED ORDER — ORAL CARE MOUTH RINSE: 15.0000 mL | Freq: Once | OROMUCOSAL | Status: AC

## 2024-02-15 MED ORDER — CHLORHEXIDINE GLUCONATE 0.12 % MT SOLN
15.0000 mL | Freq: Once | OROMUCOSAL | Status: AC
  Administered 2024-02-16: 15 mL via OROMUCOSAL

## 2024-02-15 MED ORDER — SODIUM CHLORIDE 0.9% FLUSH: 3.0000 mL | INTRAVENOUS | Status: DC | PRN

## 2024-02-15 MED ORDER — SODIUM CHLORIDE 0.9% FLUSH: 3.0000 mL | Freq: Two times a day (BID) | INTRAVENOUS | Status: DC

## 2024-02-16 ENCOUNTER — Ambulatory Visit
Admission: RE | Admit: 2024-02-16 | Discharge: 2024-02-16 | Disposition: A | Discharge status: Home / Self Care | Attending: Student in an Organized Health Care Education/Training Program | Admitting: Student in an Organized Health Care Education/Training Program

## 2024-02-16 ENCOUNTER — Encounter: Payer: Self-pay | Admitting: Student in an Organized Health Care Education/Training Program

## 2024-02-16 ENCOUNTER — Ambulatory Visit: Admitting: Anesthesiology

## 2024-02-16 ENCOUNTER — Other Ambulatory Visit: Payer: Self-pay

## 2024-02-16 ENCOUNTER — Encounter
Admission: RE | Disposition: A | Discharge status: Home / Self Care | Payer: Self-pay | Source: Home / Self Care | Attending: Student in an Organized Health Care Education/Training Program

## 2024-02-16 ENCOUNTER — Ambulatory Visit

## 2024-02-16 DIAGNOSIS — E119 Type 2 diabetes mellitus without complications: Secondary | ICD-10-CM | POA: Diagnosis not present

## 2024-02-16 DIAGNOSIS — R918 Other nonspecific abnormal finding of lung field: Secondary | ICD-10-CM

## 2024-02-16 DIAGNOSIS — C4490 Unspecified malignant neoplasm of skin, unspecified: Secondary | ICD-10-CM | POA: Insufficient documentation

## 2024-02-16 DIAGNOSIS — R1310 Dysphagia, unspecified: Secondary | ICD-10-CM | POA: Diagnosis not present

## 2024-02-16 DIAGNOSIS — C7801 Secondary malignant neoplasm of right lung: Secondary | ICD-10-CM | POA: Diagnosis not present

## 2024-02-16 DIAGNOSIS — K219 Gastro-esophageal reflux disease without esophagitis: Secondary | ICD-10-CM | POA: Insufficient documentation

## 2024-02-16 HISTORY — PX: BRONCHOSCOPY, WITH BIOPSY USING ELECTROMAGNETIC NAVIGATION: SHX7536

## 2024-02-16 LAB — GLUCOSE, CAPILLARY
Glucose-Capillary: 136 mg/dL — ABNORMAL HIGH (ref 70–99)
Glucose-Capillary: 122 mg/dL — ABNORMAL HIGH (ref 70–99)

## 2024-02-16 SURGERY — BRONCHOSCOPY, WITH BIOPSY USING ELECTROMAGNETIC NAVIGATION
Anesthesia: General | Laterality: Bilateral

## 2024-02-16 MED ORDER — PROPOFOL 10 MG/ML IV BOLUS
INTRAVENOUS | Status: DC | PRN
  Administered 2024-02-16: 120 mg via INTRAVENOUS

## 2024-02-16 MED ORDER — DEXAMETHASONE SODIUM PHOSPHATE 10 MG/ML IJ SOLN
INTRAMUSCULAR | Status: DC | PRN
  Administered 2024-02-16: 5 mg via INTRAVENOUS

## 2024-02-16 MED ORDER — PHENYLEPHRINE 80 MCG/ML (10ML) SYRINGE FOR IV PUSH (FOR BLOOD PRESSURE SUPPORT)
PREFILLED_SYRINGE | INTRAVENOUS | Status: DC | PRN
  Administered 2024-02-16 (×5): 160 ug via INTRAVENOUS
  Administered 2024-02-16 (×2): 80 ug via INTRAVENOUS

## 2024-02-16 MED ORDER — DEXAMETHASONE SODIUM PHOSPHATE 10 MG/ML IJ SOLN
INTRAMUSCULAR | Status: AC
  Filled 2024-02-16: qty 1

## 2024-02-16 MED ORDER — OXYCODONE HCL 5 MG PO TABS: 5.0000 mg | ORAL_TABLET | Freq: Once | ORAL | Status: DC | PRN

## 2024-02-16 MED ORDER — MIDAZOLAM HCL 2 MG/2ML IJ SOLN
INTRAMUSCULAR | Status: DC | PRN
  Administered 2024-02-16 (×2): 1 mg via INTRAVENOUS

## 2024-02-16 MED ORDER — PROPOFOL 1000 MG/100ML IV EMUL
INTRAVENOUS | Status: AC
  Filled 2024-02-16: qty 100

## 2024-02-16 MED ORDER — LIDOCAINE HCL (CARDIAC) PF 100 MG/5ML IV SOSY
PREFILLED_SYRINGE | INTRAVENOUS | Status: DC | PRN
  Administered 2024-02-16: 80 mg via INTRAVENOUS

## 2024-02-16 MED ORDER — CHLORHEXIDINE GLUCONATE 0.12 % MT SOLN
OROMUCOSAL | Status: AC
  Filled 2024-02-16: qty 15

## 2024-02-16 MED ORDER — PHENYLEPHRINE 80 MCG/ML (10ML) SYRINGE FOR IV PUSH (FOR BLOOD PRESSURE SUPPORT)
PREFILLED_SYRINGE | INTRAVENOUS | Status: AC
  Filled 2024-02-16: qty 10

## 2024-02-16 MED ORDER — EPHEDRINE SULFATE-NACL 50-0.9 MG/10ML-% IV SOSY
PREFILLED_SYRINGE | INTRAVENOUS | Status: DC | PRN
  Administered 2024-02-16: 5 mg via INTRAVENOUS

## 2024-02-16 MED ORDER — OXYCODONE HCL 5 MG/5ML PO SOLN: 5.0000 mg | Freq: Once | ORAL | Status: DC | PRN

## 2024-02-16 MED ORDER — PROPOFOL 500 MG/50ML IV EMUL
INTRAVENOUS | Status: DC | PRN
  Administered 2024-02-16: 130 ug/kg/min via INTRAVENOUS

## 2024-02-16 MED ORDER — ONDANSETRON HCL 4 MG/2ML IJ SOLN
INTRAMUSCULAR | Status: AC
  Filled 2024-02-16: qty 2

## 2024-02-16 MED ORDER — FENTANYL CITRATE (PF) 100 MCG/2ML IJ SOLN: 25.0000 ug | INTRAMUSCULAR | Status: DC | PRN

## 2024-02-16 MED ORDER — ROCURONIUM BROMIDE 100 MG/10ML IV SOLN
INTRAVENOUS | Status: DC | PRN
  Administered 2024-02-16: 50 mg via INTRAVENOUS

## 2024-02-16 MED ORDER — ONDANSETRON HCL 4 MG/2ML IJ SOLN
INTRAMUSCULAR | Status: DC | PRN
  Administered 2024-02-16: 4 mg via INTRAVENOUS

## 2024-02-16 MED ORDER — SUGAMMADEX SODIUM 200 MG/2ML IV SOLN
INTRAVENOUS | Status: DC | PRN
  Administered 2024-02-16: 160 mg via INTRAVENOUS

## 2024-02-16 MED ORDER — MIDAZOLAM HCL 2 MG/2ML IJ SOLN
INTRAMUSCULAR | Status: AC
  Filled 2024-02-16: qty 2

## 2024-02-16 MED ORDER — SODIUM CHLORIDE 0.9 % IV SOLN: INTRAVENOUS | Status: DC | PRN

## 2024-02-16 MED ORDER — FENTANYL CITRATE (PF) 100 MCG/2ML IJ SOLN
INTRAMUSCULAR | Status: AC
  Filled 2024-02-16: qty 2

## 2024-02-16 MED ORDER — PROPOFOL 10 MG/ML IV BOLUS
INTRAVENOUS | Status: AC
  Filled 2024-02-16: qty 20

## 2024-02-16 MED ORDER — FENTANYL CITRATE (PF) 100 MCG/2ML IJ SOLN
INTRAMUSCULAR | Status: DC | PRN
  Administered 2024-02-16: 50 ug via INTRAVENOUS

## 2024-02-16 NOTE — Interval H&P Note (Signed)
 S: Feels weak, continues to have difficulty with swallowing and facial numbness. No chest pain. No worsening cough.  O: Vitals:   02/16/24 0851  BP: (!) 159/85  Pulse: 79  Resp: 17  Temp: (!) 97.1 F (36.2 C)  SpO2: 97%   General: Well-appearing and in no distress. Well-nourished Eyes: Anicteric, no conjunctival pallor HEENT: Mucous membranes moist, no evidence of postnasal drip Lymphadenopathy: No cervical or supraclavicular adenopathy Respiratory: Trachea is midline, no respiratory distress, good bilateral air entry, no wheezes, rales, or rhonchi Cardiovascular: Heart with regular rate and rhythm, normal S1 and S2, no murmurs, rubs, or gallops Gastrointestinal: Normoactive bowel sounds, soft and nontender Neuro: Alert and oriented, no gross focal deficits  A/P: 73 year old male with history of melanoma presenting with hilar lymphadenopathy and a RLL lung mass concerning for recurrent melanoma vs primary lung malignancy. PET with signs of metastasis and leptomeningeal spread. Patient is presenting for robotic assisted navigational bronchoscopy with EBUS for tissue acquisition. He is appropriate for the procedure.  Vergia Glasgow, MD [facility] 02/16/2024 9:04 AM

## 2024-02-16 NOTE — Op Note (Signed)
 Video Bronchoscopy with Robotic Assisted Bronchoscopic Navigation   Date of Operation: 02/16/2024   Pre-op Diagnosis: Lung Mass  Surgeon: Vergia Glasgow, MD  Anesthesia: General endotracheal anesthesia  Operation: Flexible video fiberoptic bronchoscopy with robotic assistance and biopsies.  Estimated Blood Loss: Minimal  Complications: None  Indications and History: Mason Stewart is a 73 y.o. male with history of melanoma presenting with a RLL mass for biopsy and tissue aquisition.  Recommendation made to achieve a tissue diagnosis via robotic assisted navigational bronchoscopy.  The risks, benefits, complications, treatment options and expected outcomes were discussed with the patient.  The possibilities of pneumothorax, pneumonia, reaction to medication, pulmonary aspiration, perforation of a viscus, bleeding, failure to diagnose a condition and creating a complication requiring transfusion or operation were discussed with the patient who freely signed the consent.    Description of Procedure: The patient was seen in the Preoperative Area, was examined and was deemed appropriate to proceed.  The patient was taken to [facility] room 3, identified as Mason Stewart and the procedure verified as Flexible Video Fiberoptic Bronchoscopy.  A Time Out was held and the above information confirmed.   Prior to the date of the procedure a high-resolution CT scan of the chest was performed. Utilizing ION software program a virtual tracheobronchial tree was generated to allow the creation of distinct navigation pathways to the patient's parenchymal abnormalities. After being taken to the operating room general anesthesia was initiated and the patient  was orally intubated. The video fiberoptic bronchoscope was introduced via the endotracheal tube and a general inspection was performed which showed normal right and left lung anatomy. Aspiration of the bilateral mainstems was completed to remove any  remaining secretions. Robotic catheter inserted into patient's endotracheal tube.   Target #1 RLL mass: The distinct navigation pathways prepared prior to this procedure were then utilized to navigate to patient's lesion identified on CT scan. The robotic catheter was secured into place and the vision probe was withdrawn.  Lesion location was approximated using radial EBUS (concentric image) and fluoroscopy. Under fluoroscopic guidance transbronchial brushings, transbronchial needle biopsies, and transbronchial forceps biopsies were performed to be sent for cytology and pathology.  A bronchioalveolar lavage was performed in the RLL and sent for cytology.  At the end of the procedure a general airway inspection was performed and there was minimal bleeding in the RLL. 50 mcg of epinephrine  was topically instilled. We then switched to the EBUS portion and the hilar and mediastinal stations were examined. Station 7 was noted to be enlarged and was biopsied with a 21G Olympus ViziShot 2 needle. The bronchoscope was removed.  The patient tolerated the procedure well. There was no significant blood loss and there were no obvious complications. A post-procedural chest x-ray is pending.  Samples Target #1: RLL 1. Transbronchial brushings from RLL 2. Transbronchial needle biopsies from RLL 3. Transbronchial forceps biopsies from RLL 4. Bronchoalveolar lavage from RLL  Samples Target #2: EBUS to station 7; TBNA with ViziShot 2 needle  Plans:  The patient will be discharged from the PACU to home when recovered from anesthesia and after chest x-ray is reviewed. We will review the cytology, pathology and microbiology results with the patient when they become available. Outpatient followup will be with myself.  Vergia Glasgow, MD [facility] 02/16/2024 11:02 AM

## 2024-02-16 NOTE — Anesthesia Preprocedure Evaluation (Signed)
 Anesthesia Evaluation  Patient identified by MRN, date of birth, ID band Patient awake    Reviewed: Allergy & Precautions, NPO status , Patient's Chart, lab work & pertinent test results  History of Anesthesia Complications Negative for: history of anesthetic complications  Airway Mallampati: III  TM Distance: <3 FB Neck ROM: full    Dental  (+) Chipped   Pulmonary neg shortness of breath, sleep apnea     + decreased breath sounds      Cardiovascular Exercise Tolerance: Good hypertension, (-) angina (-) Past MI and (-) DOE Normal cardiovascular exam     Neuro/Psych  Headaches  Neuromuscular disease  negative psych ROS   GI/Hepatic Neg liver ROS,GERD  Controlled,,  Endo/Other  diabetes, Type 2    Renal/GU      Musculoskeletal   Abdominal   Peds  Hematology negative hematology ROS (+)   Anesthesia Other Findings Past Medical History: No date: Cancer (HCC)     Comment:  prostate cancer No date: Diabetes mellitus without complication (HCC) No date: GERD (gastroesophageal reflux disease) No date: Glaucoma (increased eye pressure) No date: Headache 12/12/2013: History of prostate cancer No date: Hypertension No date: Melanoma (HCC) 12/12/2013: Melanoma of thoracic region (HCC) 12/12/2013: 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 Cornett, MD;  Location: [facility];  Service: General;  Laterality: Right; 10/14/1995: BACK SURGERY     Comment:  lower back 03/14/2023: CARPAL TUNNEL RELEASE; Right 10/13/2010: colonscopy 11/23/2013: EXCISION MELANOMA WITH SENTINEL LYMPH NODE BIOPSY; Right      Comment:  Procedure: EXCISION MELANOMA WITH SENTINEL LYMPH NODE               BIOPSY;  Surgeon: Brandy Cal. Cornett, MD;  Location: [facility];  Service: General;  Laterality:               Right; No date: EYE SURGERY     Comment:  cataract left, reduced pressure in left yrs ago: left middle finger surgery after injury 06/07/2012: ROBOT ASSISTED LAPAROSCOPIC RADICAL PROSTATECTOMY     Comment:  Procedure: ROBOTIC ASSISTED LAPAROSCOPIC RADICAL               PROSTATECTOMY LEVEL 2;  Surgeon: Kristeen Peto, MD;                Location: [facility];  Service: Urology;  Laterality: N/A;                    BMI    Body Mass Index: 30.95 kg/m      Reproductive/Obstetrics negative OB ROS                             Anesthesia Physical Anesthesia Plan  ASA: 3  Anesthesia Plan: General ETT   Post-op Pain Management:    Induction: Intravenous  PONV Risk Score and Plan: Ondansetron , Dexamethasone , Midazolam  and Treatment may vary due to age or  medical condition  Airway Management Planned: Oral ETT  Additional Equipment:   Intra-op Plan:   Post-operative Plan: Extubation in OR  Informed Consent: I have reviewed the patients History and Physical, chart, labs and discussed the procedure including the risks, benefits and alternatives for the proposed anesthesia with the patient or authorized representative who has indicated his/her understanding and acceptance.     Dental Advisory Given  Plan Discussed with: Anesthesiologist, CRNA and Surgeon  Anesthesia Plan Comments: (Patient consented for risks of anesthesia including but not limited to:  - adverse reactions to medications - damage to eyes, teeth, lips or other oral mucosa - nerve damage due to positioning  - sore throat or hoarseness - Damage to heart, brain, nerves, lungs, other parts of body or loss of life  Patient voiced understanding and assent.)       Anesthesia Quick  Evaluation

## 2024-02-16 NOTE — Transfer of Care (Signed)
 Immediate Anesthesia Transfer of Care Note  Patient: Mason Stewart  Procedure(s) Performed: ROBOTIC ASSISTED NAVIGATIONAL BRONCHOSCOPY (Bilateral)  Patient Location: PACU  Anesthesia Type:General  Level of Consciousness: awake, drowsy, and patient cooperative  Airway & Oxygen Therapy: Patient Spontanous Breathing and Patient connected to face mask oxygen  Post-op Assessment: Report given to RN and Post -op Vital signs reviewed and stable  Post vital signs: Reviewed and stable  Last Vitals:  Vitals Value Taken Time  BP 130/82 02/16/24 1115  Temp    Pulse 72 02/16/24 1118  Resp 14 02/16/24 1118  SpO2 100 % 02/16/24 1118  Vitals shown include unfiled device data.  Last Pain:  Vitals:   02/16/24 0851  TempSrc: Temporal  PainSc: 2          Complications: No notable events documented.

## 2024-02-16 NOTE — Anesthesia Procedure Notes (Signed)
 Procedure Name: Intubation Date/Time: 02/16/2024 10:00 AM  Performed by: Orin Birk, CRNAPre-anesthesia Checklist: Patient identified, Emergency Drugs available, Suction available and Patient being monitored Patient Re-evaluated:Patient Re-evaluated prior to induction Oxygen Delivery Method: Circle system utilized Preoxygenation: Pre-oxygenation with 100% oxygen Induction Type: IV induction Ventilation: Mask ventilation without difficulty Laryngoscope Size: McGrath and 4 Grade View: Grade I Tube type: Oral Tube size: 8.5 mm Number of attempts: 1 Airway Equipment and Method: Stylet and Video-laryngoscopy Placement Confirmation: ETT inserted through vocal cords under direct vision, positive ETCO2 and breath sounds checked- equal and bilateral Secured at: 23 cm Tube secured with: Tape Dental Injury: Teeth and Oropharynx as per pre-operative assessment

## 2024-02-16 NOTE — Anesthesia Postprocedure Evaluation (Signed)
 Anesthesia Post Note  Patient: Mason Stewart  Procedure(s) Performed: ROBOTIC ASSISTED NAVIGATIONAL BRONCHOSCOPY (Bilateral)  Patient location during evaluation: PACU Anesthesia Type: General Level of consciousness: awake and alert Pain management: pain level controlled Vital Signs Assessment: post-procedure vital signs reviewed and stable Respiratory status: spontaneous breathing, nonlabored ventilation, respiratory function stable and patient connected to nasal cannula oxygen Cardiovascular status: blood pressure returned to baseline and stable Postop Assessment: no apparent nausea or vomiting Anesthetic complications: no   No notable events documented.   Last Vitals:  Vitals:   02/16/24 1200 02/16/24 1214  BP: 121/73 (!) 155/88  Pulse: 66 76  Resp: 13 17  Temp: (!) 36.1 C (!) 36.2 C  SpO2: 100% 98%    Last Pain:  Vitals:   02/16/24 1214  TempSrc: Temporal  PainSc: 0-No pain                 Portia Brittle Kenechukwu Eckstein

## 2024-02-16 NOTE — Discharge Instructions (Signed)
 Per Dr. Darnelle Elders, do not eat or drink until 1:00 pm today

## 2024-02-17 ENCOUNTER — Telehealth: Payer: Self-pay

## 2024-02-17 ENCOUNTER — Other Ambulatory Visit: Payer: Self-pay

## 2024-02-17 ENCOUNTER — Telehealth: Payer: Self-pay | Admitting: Student in an Organized Health Care Education/Training Program

## 2024-02-17 ENCOUNTER — Encounter: Payer: Self-pay | Admitting: Student in an Organized Health Care Education/Training Program

## 2024-02-17 ENCOUNTER — Emergency Department
Admission: EM | Admit: 2024-02-17 | Discharge: 2024-02-17 | Discharge status: Against Medical Advice | Attending: Emergency Medicine | Admitting: Emergency Medicine

## 2024-02-17 DIAGNOSIS — R1310 Dysphagia, unspecified: Secondary | ICD-10-CM | POA: Insufficient documentation

## 2024-02-17 DIAGNOSIS — Z85118 Personal history of other malignant neoplasm of bronchus and lung: Secondary | ICD-10-CM | POA: Diagnosis not present

## 2024-02-17 DIAGNOSIS — R531 Weakness: Secondary | ICD-10-CM | POA: Diagnosis not present

## 2024-02-17 DIAGNOSIS — Z5321 Procedure and treatment not carried out due to patient leaving prior to being seen by health care provider: Secondary | ICD-10-CM | POA: Diagnosis not present

## 2024-02-17 LAB — BASIC METABOLIC PANEL WITH GFR
Anion gap: 10 (ref 5–15)
BUN: 13 mg/dL (ref 8–23)
CO2: 27 mmol/L (ref 22–32)
Calcium: 9.2 mg/dL (ref 8.9–10.3)
Chloride: 98 mmol/L (ref 98–111)
Creatinine, Ser: 0.68 mg/dL (ref 0.61–1.24)
GFR, Estimated: >60 mL/min (ref >60)
Glucose, Bld: 127 mg/dL — ABNORMAL HIGH (ref 70–99)
Potassium: 3.6 mmol/L (ref 3.5–5.1)
Sodium: 135 mmol/L (ref 135–145)

## 2024-02-17 LAB — CYTOLOGY - NON PAP

## 2024-02-17 LAB — CBC
HCT: 38.6 % — ABNORMAL LOW (ref 39.0–52.0)
Hemoglobin: 13.9 g/dL (ref 13.0–17.0)
MCH: 31 pg (ref 26.0–34.0)
MCHC: 36 g/dL (ref 30.0–36.0)
MCV: 86 fL (ref 80.0–100.0)
Platelets: 311 10*3/uL (ref 150–400)
RBC: 4.49 MIL/uL (ref 4.22–5.81)
RDW: 12.7 % (ref 11.5–15.5)
WBC: 13.6 10*3/uL — ABNORMAL HIGH (ref 4.0–10.5)
nRBC: 0 % (ref 0.0–0.2)

## 2024-02-17 NOTE — Telephone Encounter (Signed)
 Pt has an appt tomorrow (5-8) at 1015.

## 2024-02-17 NOTE — Telephone Encounter (Signed)
 Copied from CRM [id number]. Topic: Clinical - Medical Advice >> Feb 17, 2024  7:56 AM Crist Dominion wrote: Reason for CRM: Patients wife Mason Stewart) is in distress as Dr. Darnelle Elders told her he would call her yesterday regarding the patients biopsy but did not, Mason Stewart states that the patient has not been able to keep solid food down for a month and states he will die from starvation before being able to fight his cancer. Patient does not have his first oncology appointment until next week but is need of a feeding tube as soon as possible as he cannot swallow anything. Please contact Mason Stewart on what can be done to help the patient through this. Mason Stewart states she will contact oncology but doubts they can help as patient hasn't been seen, and also states she will contact Dr. Joelle Musca (PCP) but doesn't believe he's in clinic today.  Please call Mason Stewart at [phone]

## 2024-02-17 NOTE — Telephone Encounter (Signed)
 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 He has been unable to swallow and get any nutrition. Discussed with the patient and explored possibility of PEG tube placement which is something he is willing to pursue.  I have attempted to contact patient placement and arrange for a direct admit for PEG placement, but given [facility] policy of patient needing to be seen on the same calendar day they are unable to accept to their service.  I discussed with the patient over the phone again and asked him to present to the ED for consideration of hospital admission for PEG tube placement tomorrow or Friday and help arrange for outpatient supplies for PEG.  Vergia Glasgow, MD [facility] 02/17/2024 4:58 PM

## 2024-02-17 NOTE — ED Triage Notes (Signed)
 Pt ambulatory to triage.  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  Pt has not been able to eat/drink.  Pt sent to ER for feeding tube per pt .  Pt with generalized weakness.  Pt to meet with oncologist next week.  Pt alert.

## 2024-02-17 NOTE — Telephone Encounter (Signed)
 Left detailed message on VM for pt's wife. Asked if he was seeing an oncologist. They would be be good to speak to about nutrition options for him. I did say that it would be best to come in to see Dr Joelle Musca to discuss diet and options.

## 2024-02-17 NOTE — Telephone Encounter (Signed)
 Copied from CRM [id number]. Topic: General - Other >> Feb 17, 2024  8:12 AM Marissa P wrote: Reason for CRM: His wife called in asking for someone to follow up with her please, patient has already seen a pulmonary doctor and just needs some type of nutrition put in his body, he does have cancer. They know why he he's not eating but he still needs some type of help with nutrition into his body. Please callback asap at [phone]

## 2024-02-18 ENCOUNTER — Inpatient Hospital Stay: Admitting: Internal Medicine

## 2024-02-18 ENCOUNTER — Encounter: Payer: Self-pay | Admitting: *Deleted

## 2024-02-18 LAB — SURGICAL PATHOLOGY

## 2024-02-18 NOTE — Telephone Encounter (Signed)
 Copied from CRM [id number]. Topic: Clinical - Medical Advice >> Feb 18, 2024  8:51 AM Justina Oman C wrote: Reason for CRM: Patient wants to speak with Dr. Darnelle Elders regarding feeding tube business and was suppose to have it done last night and but wants to wait. Please call back [phone]. Also, patient also wants to apologize to Dr. Darnelle Elders on all the trouble.

## 2024-02-18 NOTE — Progress Notes (Signed)
 Referral received from Dr. Darnelle Elders for lung mass and leptomeningeal spread. Spoke with pt's wife who is concerned about pt's nutritional status and unable to tolerate much oral intake at this time. Pt and wife are requesting further assistance with supportive care while awaiting biopsy results. Pt scheduled as new pt with Dr. Randy Buttery on 5/9 along with palliative care and nutritional consults. Reviewed all appts with pt's wife and informed that will discuss PEG tube placement with Dr. Randy Buttery prior to his visit tomorrow. Contact info given and instructed to call with any questions or needs. Pt's wife verbalized understanding.

## 2024-02-18 NOTE — Telephone Encounter (Signed)
 Noted. NFN.

## 2024-02-19 ENCOUNTER — Inpatient Hospital Stay

## 2024-02-19 ENCOUNTER — Encounter: Payer: Self-pay | Admitting: *Deleted

## 2024-02-19 ENCOUNTER — Encounter: Payer: Self-pay | Admitting: Oncology

## 2024-02-19 ENCOUNTER — Telehealth: Payer: Self-pay

## 2024-02-19 ENCOUNTER — Inpatient Hospital Stay: Attending: Oncology | Admitting: Oncology

## 2024-02-19 ENCOUNTER — Inpatient Hospital Stay (HOSPITAL_BASED_OUTPATIENT_CLINIC_OR_DEPARTMENT_OTHER): Admitting: Hospice and Palliative Medicine

## 2024-02-19 ENCOUNTER — Other Ambulatory Visit: Payer: Self-pay | Admitting: Radiation Therapy

## 2024-02-19 VITALS — BP 125/82 | HR 86 | Temp 96.1°F | Resp 19 | Ht 66.0 in | Wt 175.9 lb

## 2024-02-19 DIAGNOSIS — C7951 Secondary malignant neoplasm of bone: Secondary | ICD-10-CM | POA: Insufficient documentation

## 2024-02-19 DIAGNOSIS — C7801 Secondary malignant neoplasm of right lung: Secondary | ICD-10-CM | POA: Insufficient documentation

## 2024-02-19 DIAGNOSIS — C4361 Malignant melanoma of right upper limb, including shoulder: Secondary | ICD-10-CM | POA: Insufficient documentation

## 2024-02-19 DIAGNOSIS — C7949 Secondary malignant neoplasm of other parts of nervous system: Secondary | ICD-10-CM | POA: Diagnosis not present

## 2024-02-19 DIAGNOSIS — Z5112 Encounter for antineoplastic immunotherapy: Secondary | ICD-10-CM | POA: Diagnosis present

## 2024-02-19 DIAGNOSIS — R29898 Other symptoms and signs involving the musculoskeletal system: Secondary | ICD-10-CM | POA: Diagnosis not present

## 2024-02-19 DIAGNOSIS — Z79620 Long term (current) use of immunosuppressive biologic: Secondary | ICD-10-CM | POA: Insufficient documentation

## 2024-02-19 DIAGNOSIS — C439 Malignant melanoma of skin, unspecified: Secondary | ICD-10-CM

## 2024-02-19 DIAGNOSIS — Z515 Encounter for palliative care: Secondary | ICD-10-CM

## 2024-02-19 DIAGNOSIS — I69991 Dysphagia following unspecified cerebrovascular disease: Secondary | ICD-10-CM | POA: Diagnosis not present

## 2024-02-19 DIAGNOSIS — C7931 Secondary malignant neoplasm of brain: Secondary | ICD-10-CM | POA: Insufficient documentation

## 2024-02-19 DIAGNOSIS — R1310 Dysphagia, unspecified: Secondary | ICD-10-CM

## 2024-02-19 MED ORDER — NUTREN 1.5 EN LIQD: ENTERAL | 3 refills | Status: DC

## 2024-02-19 MED ORDER — LIDOCAINE-PRILOCAINE 2.5-2.5 % EX CREA: TOPICAL_CREAM | CUTANEOUS | 3 refills | Status: DC

## 2024-02-19 MED ORDER — PROCHLORPERAZINE MALEATE 10 MG PO TABS: 10.0000 mg | ORAL_TABLET | Freq: Four times a day (QID) | ORAL | 1 refills | Status: DC | PRN

## 2024-02-19 MED ORDER — ONDANSETRON HCL 8 MG PO TABS: 8.0000 mg | ORAL_TABLET | Freq: Three times a day (TID) | ORAL | 1 refills | Status: DC | PRN

## 2024-02-19 MED ORDER — NUTREN 1.5 EN LIQD: ENTERAL | Status: DC

## 2024-02-19 NOTE — Progress Notes (Signed)
 Hematology/Oncology Consult note [facility] [phone] [phone]  Patient Care Team: Helaine Llanos, MD as PCP - General (Internal Medicine) Arlen Lacks, Harrell Lima, MD as Referring Physician (Ophthalmology) Gaynelle Keeling, MD as Consulting Physician (Dermatology) Drake Gens, RN as Oncology Nurse Navigator   Name of the patient: Mason Stewart  [id number]  Nov 01, 1950    Reason for referral-new diagnosis of metastatic melanoma   Referring physician-Dr. Darnelle Elders  Date of visit: 02/19/24   History of presenting illness-patient is a 73 year old male who was seen by pulmonary for Discussion of CT chest which was done in April 2025 in the ER which showed a right lower lobe lung mass of 6.7 x 6 cm along with enlarged hilar and subcarinal lymph nodes.  This was followed by a PET CT scan which showed large hypermetabolic right lower lobe lung mass with an SUV of 21.1 concerning for malignancy.  Uptake in the right hilum could represent adjacent nodes.  Hypermetabolic sclerotic lesion involving the left hip greater trochanter concerning for skeletal metastases.  Focal area of uptake in L3-L4 spinal lesion.  He also underwent MRI cervical thoracic and lumbar spine which showed dural enhancement withhin conus medullaris concerning for leptomeningeal disease.  No osseous metastases. MRI brain with and without contrast showed abnormal nodular thickening and enhancement about multiple cranial nerves at the skull base of the tube.  With additional subtle leptomeningeal enhancement around the cerebellum consistent with leptomeningeal carcinomatosis.  Mild associated edema within the brain parenchyma of the pons/left middle cerebellar peduncle.  He has a prior history of stage III aT2 N1 M0 melanoma diagnosed in March 2015 status postsurgical resection and lymph node dissection.  He was offered adjuvant ipilimumab at that time but due to financial concerns  did not go through with the plan.  Patient has been feeling poorly over the last few weeks.  He has difficulty swallowing and mainly eats soft foods and pudding.  ECOG PS- 2  Pain scale- 0   Review of systems- Review of Systems  Constitutional:  Positive for malaise/fatigue and weight loss. Negative for chills and fever.  HENT:  Negative for congestion, ear discharge and nosebleeds.   Eyes:  Negative for blurred vision.  Respiratory:  Negative for cough, hemoptysis, sputum production, shortness of breath and wheezing.   Cardiovascular:  Negative for chest pain, palpitations, orthopnea and claudication.  Gastrointestinal:  Negative for abdominal pain, blood in stool, constipation, diarrhea, heartburn, melena, nausea and vomiting.  Genitourinary:  Negative for dysuria, flank pain, frequency, hematuria and urgency.  Musculoskeletal:  Negative for back pain, joint pain and myalgias.  Skin:  Negative for rash.  Neurological:  Negative for dizziness, tingling, focal weakness, seizures, weakness and headaches.  Endo/Heme/Allergies:  Does not bruise/bleed easily.  Psychiatric/Behavioral:  Negative for depression and suicidal ideas. The patient does not have insomnia.     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   Diabetes mellitus with circulatory complication, without long-term current use of insulin (HCC) 05/27/2021   Hypertension 05/03/2020   Glaucoma 05/03/2020   GERD (gastroesophageal reflux disease) 05/03/2020   S/P prostatectomy 05/03/2020   Erectile  dysfunction 05/03/2020   History of melanoma 03/27/2014   History of  prostate cancer 12/12/2013     Past Medical History:  Diagnosis Date   Cancer [facility])    prostate cancer   Diabetes mellitus without complication (HCC)    GERD (gastroesophageal reflux disease)    Glaucoma (increased eye pressure)    Headache    History of prostate cancer 12/12/2013   Hypertension    Melanoma (HCC)    Melanoma of thoracic region (HCC) 12/12/2013   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 Cornett, MD;  Location: [facility];  Service: General;  Laterality: Right;   BACK SURGERY  10/14/1995   lower back   BRONCHOSCOPY, WITH BIOPSY USING ELECTROMAGNETIC NAVIGATION Bilateral 02/16/2024   Procedure: ROBOTIC ASSISTED NAVIGATIONAL BRONCHOSCOPY;  Surgeon: Vergia Glasgow, MD;  Location: [facility];  Service: Pulmonary;  Laterality: Bilateral;   CARPAL TUNNEL RELEASE Right 03/14/2023   colonscopy  10/13/2010   EXCISION MELANOMA WITH SENTINEL LYMPH NODE BIOPSY Right 11/23/2013   Procedure: EXCISION MELANOMA WITH SENTINEL LYMPH NODE BIOPSY;  Surgeon: Brandy Cal. Cornett, MD;  Location: [facility];  Service: General;  Laterality: Right;   EYE SURGERY     cataract left, reduced pressure in left   left middle finger surgery after injury  yrs ago   ROBOT ASSISTED LAPAROSCOPIC RADICAL PROSTATECTOMY  06/07/2012   Procedure: ROBOTIC ASSISTED LAPAROSCOPIC RADICAL PROSTATECTOMY LEVEL 2;  Surgeon: Kristeen Peto, MD;  Location: [facility];  Service: Urology;  Laterality: N/A;        Social History   Socioeconomic History   Marital status: Married    Spouse name: Wallene Gum   Number of children: 3   Years of education: some college   Highest  education level: Not on file  Occupational History   Occupation: Web designer  Tobacco Use   Smoking status: Never    Passive exposure: Never   Smokeless tobacco: Never  Vaping Use   Vaping status: Never Used  Substance and Sexual Activity   Alcohol use: Yes    Comment: 1-2 times a week, 1 drink   Drug use: No   Sexual activity: Not Currently  Other Topics Concern   Not on file  Social History Narrative   05/03/20   From: [place]   Living: with wife Wallene Gum [phone]) and adult daughter   Work: roofing business       Family: Benedetta Bradley and Megan Spindle (also has one son who has passed away) - 2 grandchildren      Enjoys: work on cars, travel, vacation      Exercise: climbing Paediatric nurse and working   Diet: country cooking - eat a lot of veggies       No living will    Wife should be decision maker--alternate is daughters   Would accept resuscitation   Would accept tube feeds--temporary   Social Drivers of Corporate investment banker Strain: Not on file  Food Insecurity: Not on file  Transportation Needs: Not on file  Physical Activity: Not on file  Stress: Not on file  Social Connections: Not on file  Intimate Partner  Violence: Not on file     Family History  Problem Relation Age of Onset   Heart disease Mother    Cancer Father        mesthelioma   Glaucoma Father    Hypertension Sister    Hypertension Brother    Arrhythmia Brother    Birth defects Son        Batton's disease   Glaucoma Sister      Current Outpatient Medications:    acetaminophen  (TYLENOL ) 500 MG tablet, Take 1,000 mg by mouth every 6 (six) hours as needed., Disp: , Rfl:    benzonatate  (TESSALON ) 200 MG capsule, Take 1 capsule (200 mg total) by mouth 3 (three) times daily as needed for cough. (Patient not taking: Reported on 02/10/2024), Disp: 60 capsule, Rfl: 1   cyanocobalamin 1000 MCG tablet, Take 1 tablet by mouth every other day. 250 MG, Disp: , Rfl:    dorzolamide-timolol  (COSOPT) 22.3-6.8 MG/ML ophthalmic solution, Place 1 drop into both eyes 2 (two) times daily., Disp: , Rfl:    latanoprost (XALATAN) 0.005 % ophthalmic solution, Place 1 drop into both eyes 2 (two) times daily., Disp: , Rfl:    losartan -hydrochlorothiazide (HYZAAR) 50-12.5 MG tablet, TAKE 1 TABLET BY MOUTH EVERY DAY, Disp: 90 tablet, Rfl: 1   metFORMIN  (GLUCOPHAGE -XR) 500 MG 24 hr tablet, TAKE 1 TABLET BY MOUTH EVERY DAY WITH BREAKFAST, Disp: 100 tablet, Rfl: 3   Multiple Vitamin (MULTI-VITAMIN) tablet, Take 1 tablet by mouth daily., Disp: , Rfl:    omeprazole  (PRILOSEC  OTC) 20 MG tablet, Take 20 mg by mouth as needed. , Disp: , Rfl:    rosuvastatin  (CRESTOR ) 10 MG tablet, Take 1 tablet (10 mg total) by mouth 2 (two) times a week., Disp: 26 tablet, Rfl: 3   tadalafil  (CIALIS ) 20 MG tablet, Take 0.5-1 tablets (10-20 mg total) by mouth every other day as needed for erectile dysfunction., Disp: 10 tablet, Rfl: 11   zinc gluconate 50 MG tablet, Take 50 mg by mouth daily., Disp: , Rfl:    Physical exam: There were no vitals filed for this visit. Physical Exam Cardiovascular:     Rate and Rhythm: Normal rate and regular rhythm.     Heart sounds: Normal heart sounds.  Pulmonary:     Effort: Pulmonary effort is normal.     Breath sounds: Normal breath sounds.  Abdominal:     General: Bowel sounds are normal.     Palpations: Abdomen is soft.  Skin:    General: Skin is warm and dry.  Neurological:     Mental Status: He is alert and oriented to person, place, and time.           Latest Ref Rng & Units 02/17/2024    8:45 PM  CMP  Glucose 70 - 99 mg/dL 409   BUN 8 - 23 mg/dL 13   Creatinine 8.11 - 1.24 mg/dL 9.14   Sodium 782 - 956 mmol/L 135   Potassium 3.5 - 5.1 mmol/L 3.6   Chloride 98 - 111 mmol/L 98   CO2 22 - 32 mmol/L 27   Calcium  8.9 - 10.3 mg/dL 9.2       Latest Ref Rng & Units 02/17/2024    8:45 PM  CBC  WBC 4.0 - 10.5 K/uL 13.6   Hemoglobin 13.0 - 17.0 g/dL 21.3    Hematocrit 08.6 - 52.0 % 38.6   Platelets 150 - 400 K/uL 311     No images are attached to  the encounter.  DG Chest Port 1 View Result Date: 02/16/2024 CLINICAL DATA:  Status post bronchoscopy, right lower lobe mass EXAM: PORTABLE CHEST 1 VIEW COMPARISON:  02/01/2024 FINDINGS: 7.2 cm right lower lobe mass is again observed. No pneumothorax or pneumomediastinum. Low lung volumes. Thoracic spondylosis. Heart size within normal limits. IMPRESSION: 1. No pneumothorax or pneumomediastinum. 2. 7.2 cm right lower lobe mass. 3. Low lung volumes. Electronically Signed   By: Freida Jes M.D.   On: 02/16/2024 11:58   DG C-Arm 1-60 Min-No Report Result Date: 02/16/2024 Fluoroscopy was utilized by the requesting physician.  No radiographic interpretation.   MR BRAIN W WO CONTRAST Result Date: 02/09/2024 CLINICAL DATA:  Initial metastatic disease evaluation. EXAM: MRI HEAD WITHOUT AND WITH CONTRAST TECHNIQUE: Multiplanar, multiecho pulse sequences of the brain and surrounding structures were obtained without and with intravenous contrast. CONTRAST:  8mL GADAVIST  GADOBUTROL  1 MMOL/ML IV SOLN COMPARISON:  Comparison made with prior MRIs from 02/01/2024 and 02/05/2024. FINDINGS: Brain: Cerebral volume within normal limits. No significant cerebral white matter disease for age. No evidence for acute or subacute infarct. No areas of chronic cortical infarction. No acute or significant chronic intracranial blood products. Abnormal nodular thickening and enhancement seen about multiple cranial nerves at the skull base. Changes are most pronounced about the nerve root entry zones of the trigeminal nerves bilaterally (series 18, image 59). Prominent involvement of the seventh and 8 nerve cranial nerve complexes as well (series 18, image 48). Involvement of the hypoglossal nerves. Subtle nodular left a meningeal enhancement seen about the cerebellum, most pronounced at the vermis (series 18, images 75, 68, 61).  Thickening and enhancement about the pituitary stalk (series 18, image 73). 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 Mild associated edema within the brain parenchyma itself of the pons left middle cerebellar peduncle (series 11, image 18). No other significant mass effect or associated edema. No other mass lesion or abnormal enhancement. Ventricles normal size without hydrocephalus. No extra-axial fluid collections. No other abnormal enhancement. Vascular: Major intracranial vascular flow voids are maintained. Skull and upper cervical spine: Craniocervical junction within normal limits. Bone marrow signal intensity within normal limits. No focal marrow replacing lesion. No scalp soft tissue abnormality. Sinuses/Orbits: Prior ocular lens replacement on the left. Questionable focus of asymmetric enhancement at the posterior aspect of the left lobe near the optic nerve insertion (series 18, image 63). Orbital soft tissues otherwise unremarkable. Paranasal sinuses are largely clear. No significant mastoid effusion. Other: None. IMPRESSION: 1. Abnormal nodular thickening and enhancement about multiple cranial nerves at the skull base and pituitary stalk, with additional subtle leptomeningeal enhancement about the cerebellum. 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 2. Mild associated edema within the brain parenchyma of the pons/left middle cerebellar peduncle. 3. Questionable focus of asymmetric enhancement at the posterior aspect of the left globe near the optic nerve insertion. Correlation with fundoscopic examination suggested. Additionally, attention at follow-up recommended. 4. No other acute intracranial abnormality. Electronically Signed   By: Virgia Griffins M.D.   On: 02/09/2024 20:52   MR THORACIC SPINE W WO CONTRAST Result Date: 02/09/2024 CLINICAL DATA:  Initial metastatic disease evaluation. EXAM:  MRI THORACIC WITHOUT AND WITH CONTRAST TECHNIQUE: Multiplanar and multiecho pulse sequences of the thoracic spine were obtained without and with intravenous contrast. CONTRAST:  8mL GADAVIST  GADOBUTROL  1 MMOL/ML IV SOLN COMPARISON:  Prior MRI from 02/05/2024. FINDINGS: Alignment: Trace scoliosis. Alignment otherwise normal with preservation of the normal thoracic kyphosis. No listhesis. Vertebrae:  Vertebral body height maintained without acute or chronic fracture. Bone marrow signal intensity within normal limits. Benign hemangioma noted within the T3 vertebral body. No evidence for osseous metastatic disease. Degenerative reactive endplate change with marrow edema present about the T12-L1 interspace. No other abnormal marrow edema or enhancement. Cord: Few small nodular foci of enhancement seen about the visualized distal conus/cauda equina at the level of L1, concerning for leptomeningeal disease as seen on prior MRI of the lumbar spine (series 24, images 8, 9, 10). No other evidence for intracanalicular metastatic disease within the thoracic spine. Cord itself demonstrates normal signal and morphology. Paraspinal and other soft tissues: Paraspinous soft tissues within normal limits. Patient's known right lung mass partially visualized. Disc levels: T1-2: Negative interspace.  Mild facet hypertrophy.  No stenosis. T2-3: Minimal disc bulge. Mild facet hypertrophy. No spinal stenosis. Mild bilateral foraminal narrowing. T3-4:  Negative interspace.  Mild facet hypertrophy.  No stenosis. T4-5: Negative interspace. Mild facet hypertrophy. No spinal stenosis. Mild right foraminal narrowing. T5-6: Negative interspace. Mild facet hypertrophy. No significant stenosis. T6-7: Left paracentral disc protrusion indents the left ventral thecal sac. Mild facet hypertrophy. No stenosis. T7-8: Left paracentral disc extrusion with inferior migration indents the ventral thecal sac. Flattening of the left ventral cord without cord  signal changes or significant spinal stenosis. Foramina remain patent. T8-9: Left paracentral disc extrusion with possible superior migration. Flattening of the left ventral cord without cord signal changes or significant spinal stenosis. Foramina remain patent. T9-10: Left paracentral disc extrusion with superior migration. Flattening of the left ventral cord without cord signal changes or significant spinal stenosis. Mild bilateral facet hypertrophy with resultant mild right foraminal stenosis. Left neural foramina remains adequately patent. T10-11: Minimal disc bulge. Mild facet hypertrophy. No spinal stenosis. Mild right with moderate left foraminal stenosis. T11-12: Diffuse disc bulge with endplate spurring. Mild facet hypertrophy. No significant stenosis. T12-L1: Degenerative disc space narrowing diffuse disc bulge and disc desiccation. Reactive endplate spurring. Moderate facet hypertrophy. Resultant mild-to-moderate spinal stenosis with moderate bilateral foraminal narrowing. IMPRESSION: 1. Few small nodular foci of enhancement about the visualized distal conus/cauda equina at the level of L1, concerning for leptomeningeal disease as seen on prior MRI of the lumbar spine. No other evidence for intracanalicular or osseous metastatic disease within the thoracic spine. 2. Solid right lung mass, partially visualized, and better characterized on recent chest CT. 3. 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 4. Multifactorial degenerative changes with resultant multilevel foraminal narrowing as above. Notable findings include mild right foraminal stenosis at T4-5 and T9-10, with moderate left foraminal narrowing at T10-11. Electronically Signed   By: Virgia Griffins M.D.   On: 02/09/2024 18:31   MR CERVICAL SPINE W WO CONTRAST Result Date: 02/09/2024 CLINICAL DATA:  Initial evaluation for metastatic disease evaluation. EXAM: MRI CERVICAL SPINE WITHOUT AND  WITH CONTRAST TECHNIQUE: Multiplanar and multiecho pulse sequences of the cervical spine, to include the craniocervical junction and cervicothoracic junction, were obtained without and with intravenous contrast. CONTRAST:  8mL GADAVIST  GADOBUTROL  1 MMOL/ML IV SOLN COMPARISON:  Prior MRI from 02/05/2024. FINDINGS: Alignment: Mild dextroscoliosis with straightening of the normal cervical lordosis. Trace degenerative anterolisthesis of C2 on C3 and C3 on C4. Vertebrae: Vertebral body height maintained without acute or chronic fracture. Bone marrow signal intensity within normal limits. Small benign hemangioma noted within the T3 vertebral body. No evidence for osseous metastatic disease. No abnormal marrow edema or enhancement. Cord: Normal signal and morphology. No evidence for intracanalicular  metastatic disease within the cervical spine. Posterior Fossa, vertebral arteries, paraspinal tissues: Abnormal thickening and enhancement seen about multiple cranial nerves at the visualized skull base (series 12, images 6, 15). 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 Degenerative thickening noted about the tectorial membrane without significant stenosis at the craniocervical junction. Paraspinous soft tissues within normal limits. Normal flow voids seen within the vertebral arteries bilaterally. Disc levels: C2-C3: Trace anterolisthesis. Mild uncovertebral spurring without significant disc bulge. Moderate left with mild right facet hypertrophy. No significant spinal stenosis. Mild left C3 foraminal narrowing. Right neural foramina remains patent. C3-C4: Trace anterolisthesis. Small left paracentral disc protrusion mildly indents the ventral thecal sac. Left greater than right uncovertebral and facet hypertrophy. Mild spinal stenosis. Severe left with mild right C4 foraminal narrowing. C4-C5: Disc bulge with moderate right with mild left C5 foraminal stenosis.  C5-C6: Degenerative disc space narrowing with diffuse disc osteophyte complex. Flattening and partial effacement of the ventral thecal sac. Superimposed facet hypertrophy. Moderate spinal stenosis with severe right worse than left C6 foraminal narrowing. C6-C7: Degenerative disc space narrowing with diffuse disc osteophyte complex. Flattening and partial effacement of the ventral thecal sac. Superimposed mild facet hypertrophy. Moderate spinal stenosis with severe bilateral C7 foraminal narrowing. C7-T1: Tiny left paracentral disc protrusion. Mild facet hypertrophy. No spinal stenosis. Foramina remain adequately patent. IMPRESSION: 1. 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 2. No other evidence for osseous or intracanalicular metastatic disease within the cervical spine. 3. 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 4. Multifactorial degenerative changes with resultant multilevel foraminal narrowing as above. Notable findings include severe left C4 foraminal stenosis, moderate right C5 foraminal narrowing, severe bilateral C6 and C7 foraminal stenosis, with mild left C3 foraminal narrowing. Electronically Signed   By: Virgia Griffins M.D.   On: 02/09/2024 18:19   MR Lumbar Spine W Wo Contrast Result Date: 02/05/2024 CLINICAL DATA:  Lung mass. Prostate cancer. Melanoma. Abnormal PET scan. EXAM: MRI LUMBAR SPINE WITHOUT AND WITH CONTRAST TECHNIQUE: Multiplanar and multiecho pulse sequences of the lumbar spine were obtained without and with intravenous contrast. CONTRAST:  7.5mL GADAVIST  GADOBUTROL  1 MMOL/ML IV SOLN COMPARISON:  Whole body PET scan 02/04/2024 FINDINGS: Segmentation: 5 non rib-bearing lumbar type vertebral bodies are present. The lowest fully formed vertebral body is L5. Alignment:  Slight retrolisthesis present at L1-2 and L2-3. Grade 1 anterolisthesis is present at L5-S1. Straightening of the normal lumbar lordosis is present. Levoconvex curvature is centered at L4-5. Vertebrae: Mixed type 1 and type 2 Modic changes are present at L3-4. Edematous changes are present on the right. More chronic type 2 Modic changes are present posteriorly on the right at L5-S1 and posteriorly on the right at L2-3. Edematous endplate changes are present posteriorly at L1-2. Vertebral body heights are maintained. No osseous metastases are present. Conus medullaris and cauda equina: Conus extends to the L2 level. Diffuse dural enhancement is present. Nodular enhancement is present within the conus medullaris. A dorsal and right lateral nodule is present at L2 adjacent to the conus. Ventral nodule and posterior dural enhancement is present at the L2-3 level. A dorsal or dural nodule is present at L4. A 7 mm enhancing nodule is present laterally at the L4-5 disc level, corresponding to the traversing left L5 nerve roots. Paraspinal and other soft tissues: An 18 mm simple cyst is noted at  the lower pole of the right kidney. No other solid organ lesions are present. No significant adenopathy is present. Disc levels: T12-L1: Mild disc bulging is present without focal stenosis. L1-2: A right paramedian disc protrusion is present. Mild central canal stenosis is present. Disc extends into the foramina bilaterally. Moderate bilateral foraminal stenosis is present. L2-3: A leftward disc protrusion is present. Moderate facet hypertrophy is seen bilaterally. This results an central canal stenosis with moderate left and mild right subarticular narrowing. Moderate foraminal narrowing is worse on the left. L3-4: A broad-based disc protrusion is present. Moderate facet hypertrophy is noted bilaterally. This results in moderate to severe central canal stenosis. Severe right and moderate left foraminal stenosis is present. L4-5: A  broad-based disc protrusion is present. Moderate facet hypertrophy is noted bilaterally. Moderate foraminal narrowing is present bilaterally. L5-S1: Chronic loss of disc height is present. Moderate facet hypertrophy is worse on the right. Mild right subarticular narrowing is present. Moderate foraminal stenosis is worse right than left. IMPRESSION: 1. Diffuse dural enhancement with nodular enhancement within the conus medullaris. 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 2. 7 mm enhancing nodule laterally at the L4-5 disc level, corresponding to the traversing left L5 nerve roots. 3. No osseous metastases. 4. Multilevel spondylosis of the lumbar spine as described. 5. Mild central canal stenosis at L1-2. 6. Moderate bilateral foraminal stenosis at L1-2. 7. Moderate left and mild right subarticular narrowing at L2-3. 8. Moderate to severe central canal stenosis at L3-4 with severe right and moderate left foraminal stenosis. 9. Moderate foraminal narrowing bilaterally at L4-5 and L5-S1. These results will be called to the ordering clinician or representative by the Radiologist Assistant, and communication documented in the PACS or [employer]. Electronically Signed   By: Audree Leas M.D.   On: 02/05/2024 19:41   NM PET Image Initial (PI) Skull Base To Thigh (F-18 FDG) Result Date: 02/04/2024 CLINICAL DATA:  Initial treatment strategy for lung nodule. Additional history of prostate cancer and melanoma. EXAM: NUCLEAR MEDICINE PET SKULL BASE TO THIGH TECHNIQUE: 10.31 mCi F-18 FDG was injected intravenously. Full-ring PET imaging was performed from the skull base to thigh after the radiotracer. CT data was obtained and used for attenuation correction and anatomic localization. Fasting blood glucose: 148 mg/dl COMPARISON:  PET-CT report November 2015.  Chest CT 02/01/2024 FINDINGS: Mediastinal blood pool activity: SUV max 3.1 Liver activity: SUV max 3.4 NECK: No specific abnormal  radiotracer uptake seen including along lymph node chains of the submandibular, posterior triangle or internal jugular region. Near symmetric uptake of the visualized intracranial compartment. Incidental CT findings: Visualized paranasal sinuses and mastoid air cells are clear. Streak artifact related to patient's dental hardware. The parotid glands, submandibular glands and thyroid  glands unremarkable. CHEST: Large heterogeneous right lower lobe lung mass again identified corresponding to the 6.7 cm lesion seen on the recent CT scan of the chest with maximum SUV value of 21.1 worrisome for neoplasm. No additional areas of abnormal uptake along the lung parenchyma. There is some mild uptake along the right infrahilar region which may be small nodes. The right lower lobe mass as abut the hilum these nodes have maximum SUV value of 8.2. No additional areas of abnormal uptake elsewhere in the chest including mediastinum, left hilum or axillary regions. The subcarinal nodes described on the CT scan have lower level of uptake with maximum SUV of 3.4. Incidental CT findings: Scattered vascular calcifications are seen including along the coronary arteries. No pericardial effusion.  The thoracic aorta is normal course and caliber. Breathing motion. No pleural effusion or pneumothorax. Calcified tiny nodules in the left lung are again noted as on prior. ABDOMEN/PELVIS: There is physiologic distribution radiotracer along the parenchymal organs, bowel and renal collecting systems. Incidental CT findings: Nonobstructing midportion left-sided renal stone. Right-sided central renal cyst. Contracted urinary bladder. Large bowel has a normal course and caliber with scattered stool. Left-sided colonic diverticula. Normal appendix. Gallbladder is nondilated with dependent stones. Nonspecific perinephric stranding, right greater than left. SKELETON: Focal area of abnormal uptake seen along the left hip greater trochanter with faint  area of sclerosis. Maximum SUV value of 8.4 worrisome for a skeletal metastasis. Is also focus of uptake with maximum SUV value of 8.4 corresponding to the central canal of the spine at the L3-4 level. Small soft tissue nodule in this location on CT image 104. This has a differential. Incidental CT findings: Scattered degenerative changes along the spine. Multilevel degenerative changes particularly with stenosis in the lumbar region. Trace listhesis. Degenerative changes as well of the shoulders and pelvis. IMPRESSION: Large hypermetabolic right lower lobe lung mass as seen on prior CT exam scanned extending to the hilum consistent with neoplasm. Uptake in the right hilum could represent some adjacent nodes. Hypermetabolic sclerotic lesion involving the left hip greater trochanter worrisome for a skeletal metastasis. Focus area of uptake along the central canal of the spine at L3-4 left of midline is also seen. This would have a differential. An additional metastatic lesion is 1 of those differential. Recommend further workup with lumbar spine MRI with and without contrast to further delineate. Electronically Signed   By: Adrianna Horde M.D.   On: 02/04/2024 11:56   CT Chest W Contrast Result Date: 02/01/2024 CLINICAL DATA:  Respiratory illness, nondiagnostic xray Ongoing cough post COVID infection with difficulty swelling for 1 month. Right lung mass on radiographs concerning for malignancy. History of melanoma. * Tracking Code: BO * EXAM: CT CHEST WITH CONTRAST TECHNIQUE: Multidetector CT imaging of the chest was performed during intravenous contrast administration. RADIATION DOSE REDUCTION: This exam was performed according to the departmental dose-optimization program which includes automated exposure control, adjustment of the mA and/or kV according to patient size and/or use of iterative reconstruction technique. CONTRAST:  75mL OMNIPAQUE  IOHEXOL  300 MG/ML  SOLN COMPARISON:  Chest radiographs same date. No  other recent imaging. Remote PET-CT 09/05/2014. FINDINGS: Cardiovascular: No acute vascular findings. There is atherosclerosis of the aorta, great vessels and coronary arteries. There are calcifications of the aortic valve. The heart size is normal. There is no pericardial effusion. Mediastinum/Nodes: There are newly enlarged right hilar and subcarinal lymph nodes, including a 1.4 cm short axis subcarinal node on image 73/2. Right hilar lymph nodes measuring up to 1.7 x 1.0 cm on image 66/2 are partially calcified. Stable postsurgical changes in the right axilla from previous axillary node dissection. No enlarged axillary lymph nodes are identified. The thyroid  gland, trachea and esophagus demonstrate no significant findings. Lungs/Pleura: No pleural effusion or pneumothorax. As demonstrated on earlier chest radiographs, there is a large well-circumscribed solid appearing mass in the right lower lobe which measures approximately 6.7 x 6.0 cm on image 82/4. Mild surrounding posterior obstructive pneumonitis and central airway thickening. No other suspicious pulmonary nodules are identified. There are small calcified granulomas adjacent to the left major fissure. Upper abdomen: Diffuse hepatic steatosis. No focal abnormalities identified. There is no evidence of adrenal mass. Musculoskeletal/Chest wall: There is no chest wall mass or suspicious osseous  finding. Mild spondylosis. IMPRESSION: 1. Large well-circumscribed solid appearing mass in the right lower lobe with mild surrounding posterior obstructive pneumonitis and central airway thickening. Findings are not typical for pneumonia and are concerning for malignancy. Differential includes primary bronchogenic carcinoma and metastatic melanoma given the patient's history. 2. Newly enlarged right hilar and subcarinal lymph nodes, suspicious for metastatic disease. PET-CT may be helpful for further evaluation. 3. No other evidence of metastatic disease. 4. Hepatic  steatosis. 5. Aortic atherosclerosis. Electronically Signed   By: Elmon Hagedorn M.D.   On: 02/01/2024 18:41   MR BRAIN WO CONTRAST Result Date: 02/01/2024 CLINICAL DATA:  Neuro deficit, acute, stroke suspected. Facial numbness and difficulty swallowing. EXAM: MRI HEAD WITHOUT CONTRAST TECHNIQUE: Multiplanar, multiecho pulse sequences of the brain and surrounding structures were obtained without intravenous contrast. COMPARISON:  Head CT 02/01/2024 FINDINGS: Brain: There is no evidence of an acute infarct, intracranial hemorrhage, mass, midline shift, or extra-axial fluid collection. Cerebral volume is normal. The ventricles are normal in size. No significant white matter disease is seen for age. Vascular: Major intracranial vascular flow voids are preserved. Skull and upper cervical spine: Unremarkable brain marrow signal. Sinuses/Orbits: Left cataract extraction. Minimal mucosal thickening or small mucous retention cysts in the maxillary sinuses. Clear mastoid air cells. Other: None. IMPRESSION: Unremarkable appearance of the brain for age. Electronically Signed   By: Aundra Lee M.D.   On: 02/01/2024 16:15   DG Chest 2 View Result Date: 02/01/2024 CLINICAL DATA:  Ongoing cough post COVID. EXAM: CHEST - 2 VIEW COMPARISON:  PET-CT 09/05/2014 FINDINGS: Normal cardiac and mediastinal contours. There is a 7.7 x 6.6 cm mass projecting over the right lower lung. Minimal heterogeneous opacities left lung base. Surgical clips right axilla. Thoracic spine degenerative changes. IMPRESSION: 1. 7.7 cm mass projecting over the right lower lung. Possibility of malignant process not excluded. Less likely findings represent infectious process. Recommend further evaluation with chest CT. 2. Minimal heterogeneous opacities left lung base may represent atelectasis or infection. Electronically Signed   By: Jone Neither M.D.   On: 02/01/2024 12:37   CT Head Wo Contrast Result Date: 02/01/2024 CLINICAL DATA:  Neuro deficit,  concern for stroke, difficulty chewing and swallowing, facial numbness for 1 month. EXAM: CT HEAD WITHOUT CONTRAST TECHNIQUE: Contiguous axial images were obtained from the base of the skull through the vertex without intravenous contrast. RADIATION DOSE REDUCTION: This exam was performed according to the departmental dose-optimization program which includes automated exposure control, adjustment of the mA and/or kV according to patient size and/or use of iterative reconstruction technique. COMPARISON:  None Available. FINDINGS: Brain: No acute intracranial hemorrhage. No CT evidence of acute infarct. No edema, mass effect, or midline shift. The basilar cisterns are patent. Ventricles: The ventricles are normal. Vascular: Atherosclerotic calcifications of the carotid siphons. No hyperdense vessel. Skull: No acute or aggressive finding. Orbits: Left lens replacement.  Orbits are otherwise unremarkable. Sinuses: The visualized paranasal sinuses are clear. Other: Mastoid air cells are clear. IMPRESSION: No CT evidence of acute intracranial abnormality. Electronically Signed   By: Denny Flack M.D.   On: 02/01/2024 10:53    Assessment and plan- Patient is a 73 y.o. male with history of melanoma involving the skin of the right shoulder back in 2015 stage III now presenting with metastatic disease here to discuss further management  I have reviewed PET CT scan images independentlyAs well as MRI images and discussed findings with the patient and his family.  Patient noted to have a  large hypermetabolic right lower lobe lung mass extending to the hilum.  Hypermetabolic sclerotic lesion involving the left hip concerning for skeletal metastases.  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  Lung biopsy was consistent with malignant melanoma.  Patient does not have resectable disease at this time and will benefit from systemic treatment.  Given the presence of leptomeningeal disease I would like to  proceed with combination immunotherapy with ipilimumab at 1 mg/kg along with nivolumab 3 mg/kg IV every 3 weeks for 4 treatments followed by maintenance nivolumab until progression or toxicity.  Discussed risks and benefits of  immunotherapy including all but not limited to autoimmune side effects such as autoimmune colitis thyroiditis and other endocrinopathies, skin rash diarrhea pneumonitis.  Treatment will be given with the palliative intent.  Patient understands and agrees to proceed as planned and we will plan on starting treatment next week.  I am holding off on referring him to radiation oncology at this time for craniospinal radiation.  I am also not planning to start him on steroids as that would interfere with benefits of immunotherapy.  Relative to nivolumab alone, results for the combination for the entire study population were as follows:  Progression-free survival - The 10-year PFS was increased with nivolumab plus ipilimumab compared with nivolumab alone (31 versus 23 percent, HR 0.79, 95% CI 0.65-0.96).  Overall survival - The 10-year OS for nivolumab plus ipilimumab was 43 percent, compared with 37 percent with nivolumab (HR 0.85, 95% CI 0.69-1.05).  In a randomized phase IIIb/IV study (CheckMate 511) of 360 patients with previously untreated, unresectable stage III or IV melanoma, an alternative dosing strategy of nivolumab 3 mg/kg plus ipilimumab 1 mg/kg reduced toxicity compared with a standard dosing strategy of nivolumab 1 mg/kg plus ipilimumab 3 mg/kg. In preliminary results, at median follow-up of 44 months, compared with standard dosing, the alternative dosing decreased treatment-related grade >=3 adverse events (34 versus 48 percent, odds ratio 0.55, 95% CI 0.36-0.84)  In an open-label single-arm phase II trial (CheckMate-204), 101 patients with asymptomatic brain metastases were treated with nivolumab plus ipilimumab as above [127,128,130,131]. After a median follow-up of  approximately 34 months, this combination demonstrated  ?Intracranial objective response rate (ORR) of 54 percent (33 and 21 percent with complete and partial responses, respectively) with most responses occurring early in treatment (median 1.4 months). Among those with objective responses, a majority (85 percent) had ongoing treatment responses.  ?Three-year intracranial progression-free survival (PFS) of 54 percent.  ?Three-year OS of 72 percent.  I would like the patient to see Dr. Mark Sil from neuro-oncology given presence of leptomeningeal disease.  I am referring him to speech pathology for swallow evaluation.  Given his poor oral intake we will plan for PEG tube placement and I have personally discussed this case with Dr. Mauri Sous as well.  He will likely get PEG tube next week along with port placement at the same time.  I will consider referring him to [facility] down the line for clinical trial.  We are also sending off NGS testing for BRAF testing and other actionable mutations if any.  Patient is also meeting with palliative care and nutrition today.  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     Cancer Staging  Metastatic melanoma [facility]) Staging form: Melanoma of the Skin, AJCC 8th Edition - Clinical stage from 02/19/2024: Stage IV (pM1d) - Signed by Avonne Boettcher, MD  on 02/19/2024 Stage prefix: Recurrence   Thank you for this kind referral and the opportunity to participate in the care of this patient   Visit Diagnosis 1. Metastatic melanoma (HCC)   2. Dysphagia, unspecified type   3. Dysphagia following unspecified cerebrovascular disease     Dr. Seretha Dance, MD, MPH [facility] at [facility] [phone] 02/19/2024

## 2024-02-19 NOTE — Addendum Note (Signed)
 Addended by: Lander Pines B on: 02/19/2024 02:29 PM   Modules accepted: Orders

## 2024-02-19 NOTE — Progress Notes (Signed)
 [employer] xT, xR, PDL1 28-8 submitted on recent lung biopsy sample ([id number]). Financial assistance approved for $0 out-of-pocket expense.

## 2024-02-19 NOTE — Progress Notes (Signed)
 Palliative Medicine [facility] at [facility] Telephone:(336) [phone] Fax:(336) [phone]   Name: Mason Stewart Date: 02/19/2024 MRN: [id number]  DOB: November 27, 1950  Patient Care Team: Helaine Llanos, MD as PCP - General (Internal Medicine) Arlen Lacks, Harrell Lima, MD as Referring Physician (Ophthalmology) Gaynelle Keeling, MD as Consulting Physician (Dermatology) Drake Gens, RN as Oncology Nurse Navigator    REASON FOR CONSULTATION: Mason Stewart is a 73 y.o. male with multiple medical problems including history of melanoma and prostate cancer, now with stage IV melanoma with leptomeningeal spread.  Palliative care consulted to address goals.  SOCIAL HISTORY:     reports that he has never smoked. He has never been exposed to tobacco smoke. He has never used smokeless tobacco. He reports current alcohol use. He reports that he does not use drugs.  Patient is married and lives at home with his wife.  They have two daughters who live nearby.  Patient had a son who died in 20 at age 73 Batten's disease.  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 Cornett, MD;  Location: [facility];  Service: General;  Laterality: Right;   BACK SURGERY  10/14/1995   lower back   BRONCHOSCOPY, WITH BIOPSY USING ELECTROMAGNETIC NAVIGATION Bilateral 02/16/2024   Procedure: ROBOTIC ASSISTED NAVIGATIONAL BRONCHOSCOPY;  Surgeon: Vergia Glasgow, MD;  Location: [facility];  Service: Pulmonary;  Laterality: Bilateral;   CARPAL TUNNEL RELEASE Right 03/14/2023   colonscopy  10/13/2010   EXCISION MELANOMA WITH SENTINEL LYMPH NODE BIOPSY Right 11/23/2013   Procedure: EXCISION MELANOMA WITH SENTINEL LYMPH NODE BIOPSY;  Surgeon: Brandy Cal. Cornett, MD;  Location: [facility];  Service: General;  Laterality: Right;   EYE SURGERY     cataract left, reduced pressure in left   left middle finger surgery after injury  yrs ago   ROBOT ASSISTED LAPAROSCOPIC RADICAL PROSTATECTOMY  06/07/2012   Procedure: ROBOTIC ASSISTED LAPAROSCOPIC RADICAL PROSTATECTOMY LEVEL 2;  Surgeon: Kristeen Peto, MD;  Location: [facility];  Service: Urology;  Laterality: N/A;        HEMATOLOGY/ONCOLOGY HISTORY:  Oncology History Overview Note  Melanoma of thoracic region   Primary site: Melanoma of the Skin (Right)   Staging method: AJCC 7th Edition   Clinical: Stage III (T2, N1, M0) signed by Almeda Jacobs, MD on 12/23/2013  9:55 AM   Pathologic: (T2, N1, cM0) signed by Almeda Jacobs, MD on 12/23/2013  9:56 AM   Summary: Stage III (T2, N1, cM0)  Also history of prostate cancer T2cN0M0 status post prostatectomy.   Melanoma  of thoracic region [facility]) (Resolved)  06/07/2012 Surgery   The patient underwent prostate surgery which show Gleason 3+4 involving both lobes.   10/20/2013 Procedure   The patient underwent biopsy to confirm superficial spreading melanoma, 1.82 mm thickness   11/23/2013 Surgery   The patient underwent wide local excision and sentinel lymph node biopsy which showed 1 lymph node involvement.   12/13/2013 Surgery   The patient  underwent in complete lymph node dissection did show no evidence of disease.   02/20/2014 Imaging   Staging PET CT scan show no evidence of disease recurrence.     ALLERGIES:  is allergic to lisinopril and pollen extract.  MEDICATIONS:  Current Outpatient Medications  Medication Sig Dispense Refill   acetaminophen  (TYLENOL ) 500 MG tablet Take 1,000 mg by mouth every 6 (six) hours as needed.     benzonatate  (TESSALON ) 200 MG capsule Take 1 capsule (200 mg total) by mouth 3 (three) times daily as needed for cough. (Patient not taking: Reported on 02/10/2024) 60 capsule 1   cyanocobalamin 1000 MCG tablet Take 1 tablet by mouth every other day. 250 MG     dorzolamide-timolol (COSOPT) 22.3-6.8 MG/ML ophthalmic solution Place 1 drop into both eyes 2 (two) times daily.     latanoprost (XALATAN) 0.005 % ophthalmic solution Place 1 drop into both eyes 2 (two) times daily.     losartan -hydrochlorothiazide (HYZAAR) 50-12.5 MG tablet TAKE 1 TABLET BY MOUTH EVERY DAY 90 tablet 1   metFORMIN  (GLUCOPHAGE -XR) 500 MG 24 hr tablet TAKE 1 TABLET BY MOUTH EVERY DAY WITH BREAKFAST 100 tablet 3   Multiple Vitamin (MULTI-VITAMIN) tablet Take 1 tablet by mouth daily.     omeprazole  (PRILOSEC  OTC) 20 MG tablet Take 20 mg by mouth as needed.      rosuvastatin  (CRESTOR ) 10 MG tablet Take 1 tablet (10 mg total) by mouth 2 (two) times a week. 26 tablet 3   tadalafil  (CIALIS ) 20 MG tablet Take 0.5-1 tablets (10-20 mg total) by mouth every other day as needed for erectile dysfunction. 10 tablet 11   zinc gluconate 50 MG tablet Take 50 mg by mouth daily.     No current facility-administered medications for this visit.    VITAL SIGNS: There were no vitals taken for this visit. There were no vitals filed for this visit.  Estimated body mass index is 28.39 kg/m as calculated from the following:   Height as of an earlier encounter on 02/19/24: 5\' 6"  (1.676 m).   Weight as of an earlier encounter on 02/19/24: 175 lb 14.4 oz  (79.8 kg).  LABS: CBC:    Component Value Date/Time   WBC 13.6 (H) 02/17/2024 2045   HGB 13.9 02/17/2024 2045   HGB 15.5 09/26/2014 0832   HCT 38.6 (L) 02/17/2024 2045   HCT 43.2 09/26/2014 0832   PLT 311 02/17/2024 2045   PLT 223 09/26/2014 0832   MCV 86.0 02/17/2024 2045   MCV 86.6 09/26/2014 0832   NEUTROABS 5.4 09/26/2014 0832   LYMPHSABS 1.0 09/26/2014 0832   MONOABS 0.5 09/26/2014 0832   EOSABS 0.2 09/26/2014 0832   BASOSABS 0.0 09/26/2014 0832   Comprehensive Metabolic Panel:    Component Value Date/Time   NA 135 02/17/2024 2045   NA 138 02/03/2020 0000   NA 139 09/26/2014 0832   K 3.6 02/17/2024 2045   K 3.9 09/26/2014 0832   CL 98 02/17/2024 2045   CO2 27 02/17/2024 2045   CO2 22 09/26/2014 0832   BUN 13  02/17/2024 2045   BUN 12 02/03/2020 0000   BUN 13.3 09/26/2014 0832   CREATININE 0.68 02/17/2024 2045   CREATININE 1.0 09/26/2014 0832   GLUCOSE 127 (H) 02/17/2024 2045   GLUCOSE 197 (H) 09/26/2014 0832   CALCIUM  9.2 02/17/2024 2045   CALCIUM  9.0 09/26/2014 0832   AST 21 02/01/2024 0925   AST 19 09/26/2014 0832   ALT 17 02/01/2024 0925   ALT 28 09/26/2014 0832   ALKPHOS 52 02/01/2024 0925   ALKPHOS 69 09/26/2014 0832   BILITOT 1.3 (H) 02/01/2024 0925   BILITOT 0.92 09/26/2014 0832   PROT 7.4 02/01/2024 0925   PROT 6.5 09/26/2014 0832   ALBUMIN 4.1 02/01/2024 0925   ALBUMIN 3.8 09/26/2014 0832    RADIOGRAPHIC STUDIES: DG Chest Port 1 View Result Date: 02/16/2024 CLINICAL DATA:  Status post bronchoscopy, right lower lobe mass EXAM: PORTABLE CHEST 1 VIEW COMPARISON:  02/01/2024 FINDINGS: 7.2 cm right lower lobe mass is again observed. No pneumothorax or pneumomediastinum. Low lung volumes. Thoracic spondylosis. Heart size within normal limits. IMPRESSION: 1. No pneumothorax or pneumomediastinum. 2. 7.2 cm right lower lobe mass. 3. Low lung volumes. Electronically Signed   By: Freida Jes M.D.   On: 02/16/2024 11:58   DG C-Arm 1-60 Min-No  Report Result Date: 02/16/2024 Fluoroscopy was utilized by the requesting physician.  No radiographic interpretation.   MR BRAIN W WO CONTRAST Result Date: 02/09/2024 CLINICAL DATA:  Initial metastatic disease evaluation. EXAM: MRI HEAD WITHOUT AND WITH CONTRAST TECHNIQUE: Multiplanar, multiecho pulse sequences of the brain and surrounding structures were obtained without and with intravenous contrast. CONTRAST:  8mL GADAVIST  GADOBUTROL  1 MMOL/ML IV SOLN COMPARISON:  Comparison made with prior MRIs from 02/01/2024 and 02/05/2024. FINDINGS: Brain: Cerebral volume within normal limits. No significant cerebral white matter disease for age. No evidence for acute or subacute infarct. No areas of chronic cortical infarction. No acute or significant chronic intracranial blood products. Abnormal nodular thickening and enhancement seen about multiple cranial nerves at the skull base. Changes are most pronounced about the nerve root entry zones of the trigeminal nerves bilaterally (series 18, image 59). Prominent involvement of the seventh and 8 nerve cranial nerve complexes as well (series 18, image 48). Involvement of the hypoglossal nerves. Subtle nodular left a meningeal enhancement seen about the cerebellum, most pronounced at the vermis (series 18, images 75, 68, 61). Thickening and enhancement about the pituitary stalk (series 18, image 73). 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 Mild associated edema within the brain parenchyma itself of the pons left middle cerebellar peduncle (series 11, image 18). No other significant mass effect or associated edema. No other mass lesion or abnormal enhancement. Ventricles normal size without hydrocephalus. No extra-axial fluid collections. No other abnormal enhancement. Vascular: Major intracranial vascular flow voids are maintained. Skull and upper cervical spine: Craniocervical junction within normal limits. Bone marrow signal intensity  within normal limits. No focal marrow replacing lesion. No scalp soft tissue abnormality. Sinuses/Orbits: Prior ocular lens replacement on the left. Questionable focus of asymmetric enhancement at the posterior aspect of the left lobe near the optic nerve insertion (series 18, image 63). Orbital soft tissues otherwise unremarkable. Paranasal sinuses are largely clear. No significant mastoid effusion. Other: None. IMPRESSION: 1. Abnormal nodular thickening and enhancement about multiple cranial nerves at the skull base and pituitary stalk, with additional subtle leptomeningeal enhancement about the cerebellum. 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 2. Mild associated edema within the brain parenchyma of the  pons/left middle cerebellar peduncle. 3. Questionable focus of asymmetric enhancement at the posterior aspect of the left globe near the optic nerve insertion. Correlation with fundoscopic examination suggested. Additionally, attention at follow-up recommended. 4. No other acute intracranial abnormality. Electronically Signed   By: Virgia Griffins M.D.   On: 02/09/2024 20:52   MR THORACIC SPINE W WO CONTRAST Result Date: 02/09/2024 CLINICAL DATA:  Initial metastatic disease evaluation. EXAM: MRI THORACIC WITHOUT AND WITH CONTRAST TECHNIQUE: Multiplanar and multiecho pulse sequences of the thoracic spine were obtained without and with intravenous contrast. CONTRAST:  8mL GADAVIST  GADOBUTROL  1 MMOL/ML IV SOLN COMPARISON:  Prior MRI from 02/05/2024. FINDINGS: Alignment: Trace scoliosis. Alignment otherwise normal with preservation of the normal thoracic kyphosis. No listhesis. Vertebrae: Vertebral body height maintained without acute or chronic fracture. Bone marrow signal intensity within normal limits. Benign hemangioma noted within the T3 vertebral body. No evidence for osseous metastatic disease. Degenerative reactive endplate change  with marrow edema present about the T12-L1 interspace. No other abnormal marrow edema or enhancement. Cord: Few small nodular foci of enhancement seen about the visualized distal conus/cauda equina at the level of L1, concerning for leptomeningeal disease as seen on prior MRI of the lumbar spine (series 24, images 8, 9, 10). No other evidence for intracanalicular metastatic disease within the thoracic spine. Cord itself demonstrates normal signal and morphology. Paraspinal and other soft tissues: Paraspinous soft tissues within normal limits. Patient's known right lung mass partially visualized. Disc levels: T1-2: Negative interspace.  Mild facet hypertrophy.  No stenosis. T2-3: Minimal disc bulge. Mild facet hypertrophy. No spinal stenosis. Mild bilateral foraminal narrowing. T3-4:  Negative interspace.  Mild facet hypertrophy.  No stenosis. T4-5: Negative interspace. Mild facet hypertrophy. No spinal stenosis. Mild right foraminal narrowing. T5-6: Negative interspace. Mild facet hypertrophy. No significant stenosis. T6-7: Left paracentral disc protrusion indents the left ventral thecal sac. Mild facet hypertrophy. No stenosis. T7-8: Left paracentral disc extrusion with inferior migration indents the ventral thecal sac. Flattening of the left ventral cord without cord signal changes or significant spinal stenosis. Foramina remain patent. T8-9: Left paracentral disc extrusion with possible superior migration. Flattening of the left ventral cord without cord signal changes or significant spinal stenosis. Foramina remain patent. T9-10: Left paracentral disc extrusion with superior migration. Flattening of the left ventral cord without cord signal changes or significant spinal stenosis. Mild bilateral facet hypertrophy with resultant mild right foraminal stenosis. Left neural foramina remains adequately patent. T10-11: Minimal disc bulge. Mild facet hypertrophy. No spinal stenosis. Mild right with moderate left  foraminal stenosis. T11-12: Diffuse disc bulge with endplate spurring. Mild facet hypertrophy. No significant stenosis. T12-L1: Degenerative disc space narrowing diffuse disc bulge and disc desiccation. Reactive endplate spurring. Moderate facet hypertrophy. Resultant mild-to-moderate spinal stenosis with moderate bilateral foraminal narrowing. IMPRESSION: 1. Few small nodular foci of enhancement about the visualized distal conus/cauda equina at the level of L1, concerning for leptomeningeal disease as seen on prior MRI of the lumbar spine. No other evidence for intracanalicular or osseous metastatic disease within the thoracic spine. 2. Solid right lung mass, partially visualized, and better characterized on recent chest CT. 3. 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 4. Multifactorial degenerative changes with resultant multilevel foraminal narrowing as above. Notable findings include mild right foraminal stenosis at T4-5 and T9-10, with moderate left foraminal narrowing at T10-11. Electronically Signed   By: Virgia Griffins M.D.   On: 02/09/2024 18:31   MR CERVICAL SPINE W WO CONTRAST Result Date:  02/09/2024 CLINICAL DATA:  Initial evaluation for metastatic disease evaluation. EXAM: MRI CERVICAL SPINE WITHOUT AND WITH CONTRAST TECHNIQUE: Multiplanar and multiecho pulse sequences of the cervical spine, to include the craniocervical junction and cervicothoracic junction, were obtained without and with intravenous contrast. CONTRAST:  8mL GADAVIST  GADOBUTROL  1 MMOL/ML IV SOLN COMPARISON:  Prior MRI from 02/05/2024. FINDINGS: Alignment: Mild dextroscoliosis with straightening of the normal cervical lordosis. Trace degenerative anterolisthesis of C2 on C3 and C3 on C4. Vertebrae: Vertebral body height maintained without acute or chronic fracture. Bone marrow signal intensity within normal limits. Small benign hemangioma noted within the T3 vertebral body. No  evidence for osseous metastatic disease. No abnormal marrow edema or enhancement. Cord: Normal signal and morphology. No evidence for intracanalicular metastatic disease within the cervical spine. Posterior Fossa, vertebral arteries, paraspinal tissues: Abnormal thickening and enhancement seen about multiple cranial nerves at the visualized skull base (series 12, images 6, 15). 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 Degenerative thickening noted about the tectorial membrane without significant stenosis at the craniocervical junction. Paraspinous soft tissues within normal limits. Normal flow voids seen within the vertebral arteries bilaterally. Disc levels: C2-C3: Trace anterolisthesis. Mild uncovertebral spurring without significant disc bulge. Moderate left with mild right facet hypertrophy. No significant spinal stenosis. Mild left C3 foraminal narrowing. Right neural foramina remains patent. C3-C4: Trace anterolisthesis. Small left paracentral disc protrusion mildly indents the ventral thecal sac. Left greater than right uncovertebral and facet hypertrophy. Mild spinal stenosis. Severe left with mild right C4 foraminal narrowing. C4-C5: Disc bulge with moderate right with mild left C5 foraminal stenosis. C5-C6: Degenerative disc space narrowing with diffuse disc osteophyte complex. Flattening and partial effacement of the ventral thecal sac. Superimposed facet hypertrophy. Moderate spinal stenosis with severe right worse than left C6 foraminal narrowing. C6-C7: Degenerative disc space narrowing with diffuse disc osteophyte complex. Flattening and partial effacement of the ventral thecal sac. Superimposed mild facet hypertrophy. Moderate spinal stenosis with severe bilateral C7 foraminal narrowing. C7-T1: Tiny left paracentral disc protrusion. Mild facet hypertrophy. No spinal stenosis. Foramina remain adequately patent. IMPRESSION: 1. Abnormal  thickening and enhancement about multiple cranial nerves at the visualized skull base, most concerning for leptomeningeal metastatic disease/carcinomatosis given provided history. 2. No other evidence for osseous or intracanalicular metastatic disease within the cervical spine. 3. 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 4. Multifactorial degenerative changes with resultant multilevel foraminal narrowing as above. Notable findings include severe left C4 foraminal stenosis, moderate right C5 foraminal narrowing, severe bilateral C6 and C7 foraminal stenosis, with mild left C3 foraminal narrowing. Electronically Signed   By: Virgia Griffins M.D.   On: 02/09/2024 18:19   MR Lumbar Spine W Wo Contrast Result Date: 02/05/2024 CLINICAL DATA:  Lung mass. Prostate cancer. Melanoma. Abnormal PET scan. EXAM: MRI LUMBAR SPINE WITHOUT AND WITH CONTRAST TECHNIQUE: Multiplanar and multiecho pulse sequences of the lumbar spine were obtained without and with intravenous contrast. CONTRAST:  7.5mL GADAVIST  GADOBUTROL  1 MMOL/ML IV SOLN COMPARISON:  Whole body PET scan 02/04/2024 FINDINGS: Segmentation: 5 non rib-bearing lumbar type vertebral bodies are present. The lowest fully formed vertebral body is L5. Alignment: Slight retrolisthesis present at L1-2 and L2-3. Grade 1 anterolisthesis is present at L5-S1. Straightening of the normal lumbar lordosis is present. Levoconvex curvature is centered at L4-5. Vertebrae: Mixed type 1 and type 2 Modic changes are present at L3-4. Edematous changes are present on the right. More chronic type 2 Modic  changes are present posteriorly on the right at L5-S1 and posteriorly on the right at L2-3. Edematous endplate changes are present posteriorly at L1-2. Vertebral body heights are maintained. No osseous metastases are present. Conus medullaris and cauda equina: Conus extends to the L2 level. Diffuse dural enhancement is  present. Nodular enhancement is present within the conus medullaris. A dorsal and right lateral nodule is present at L2 adjacent to the conus. Ventral nodule and posterior dural enhancement is present at the L2-3 level. A dorsal or dural nodule is present at L4. A 7 mm enhancing nodule is present laterally at the L4-5 disc level, corresponding to the traversing left L5 nerve roots. Paraspinal and other soft tissues: An 18 mm simple cyst is noted at the lower pole of the right kidney. No other solid organ lesions are present. No significant adenopathy is present. Disc levels: T12-L1: Mild disc bulging is present without focal stenosis. L1-2: A right paramedian disc protrusion is present. Mild central canal stenosis is present. Disc extends into the foramina bilaterally. Moderate bilateral foraminal stenosis is present. L2-3: A leftward disc protrusion is present. Moderate facet hypertrophy is seen bilaterally. This results an central canal stenosis with moderate left and mild right subarticular narrowing. Moderate foraminal narrowing is worse on the left. L3-4: A broad-based disc protrusion is present. Moderate facet hypertrophy is noted bilaterally. This results in moderate to severe central canal stenosis. Severe right and moderate left foraminal stenosis is present. L4-5: A broad-based disc protrusion is present. Moderate facet hypertrophy is noted bilaterally. Moderate foraminal narrowing is present bilaterally. L5-S1: Chronic loss of disc height is present. Moderate facet hypertrophy is worse on the right. Mild right subarticular narrowing is present. Moderate foraminal stenosis is worse right than left. IMPRESSION: 1. Diffuse dural enhancement with nodular enhancement within the conus medullaris. 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 2. 7 mm enhancing nodule laterally at the L4-5 disc level, corresponding to the traversing left L5 nerve roots. 3. No osseous metastases. 4. Multilevel  spondylosis of the lumbar spine as described. 5. Mild central canal stenosis at L1-2. 6. Moderate bilateral foraminal stenosis at L1-2. 7. Moderate left and mild right subarticular narrowing at L2-3. 8. Moderate to severe central canal stenosis at L3-4 with severe right and moderate left foraminal stenosis. 9. Moderate foraminal narrowing bilaterally at L4-5 and L5-S1. These results will be called to the ordering clinician or representative by the Radiologist Assistant, and communication documented in the PACS or [employer]. Electronically Signed   By: Audree Leas M.D.   On: 02/05/2024 19:41   NM PET Image Initial (PI) Skull Base To Thigh (F-18 FDG) Result Date: 02/04/2024 CLINICAL DATA:  Initial treatment strategy for lung nodule. Additional history of prostate cancer and melanoma. EXAM: NUCLEAR MEDICINE PET SKULL BASE TO THIGH TECHNIQUE: 10.31 mCi F-18 FDG was injected intravenously. Full-ring PET imaging was performed from the skull base to thigh after the radiotracer. CT data was obtained and used for attenuation correction and anatomic localization. Fasting blood glucose: 148 mg/dl COMPARISON:  PET-CT report November 2015.  Chest CT 02/01/2024 FINDINGS: Mediastinal blood pool activity: SUV max 3.1 Liver activity: SUV max 3.4 NECK: No specific abnormal radiotracer uptake seen including along lymph node chains of the submandibular, posterior triangle or internal jugular region. Near symmetric uptake of the visualized intracranial compartment. Incidental CT findings: Visualized paranasal sinuses and mastoid air cells are clear. Streak artifact related to patient's dental hardware. The parotid glands, submandibular glands and thyroid  glands unremarkable.  CHEST: Large heterogeneous right lower lobe lung mass again identified corresponding to the 6.7 cm lesion seen on the recent CT scan of the chest with maximum SUV value of 21.1 worrisome for neoplasm. No additional areas of abnormal uptake along  the lung parenchyma. There is some mild uptake along the right infrahilar region which may be small nodes. The right lower lobe mass as abut the hilum these nodes have maximum SUV value of 8.2. No additional areas of abnormal uptake elsewhere in the chest including mediastinum, left hilum or axillary regions. The subcarinal nodes described on the CT scan have lower level of uptake with maximum SUV of 3.4. Incidental CT findings: Scattered vascular calcifications are seen including along the coronary arteries. No pericardial effusion. The thoracic aorta is normal course and caliber. Breathing motion. No pleural effusion or pneumothorax. Calcified tiny nodules in the left lung are again noted as on prior. ABDOMEN/PELVIS: There is physiologic distribution radiotracer along the parenchymal organs, bowel and renal collecting systems. Incidental CT findings: Nonobstructing midportion left-sided renal stone. Right-sided central renal cyst. Contracted urinary bladder. Large bowel has a normal course and caliber with scattered stool. Left-sided colonic diverticula. Normal appendix. Gallbladder is nondilated with dependent stones. Nonspecific perinephric stranding, right greater than left. SKELETON: Focal area of abnormal uptake seen along the left hip greater trochanter with faint area of sclerosis. Maximum SUV value of 8.4 worrisome for a skeletal metastasis. Is also focus of uptake with maximum SUV value of 8.4 corresponding to the central canal of the spine at the L3-4 level. Small soft tissue nodule in this location on CT image 104. This has a differential. Incidental CT findings: Scattered degenerative changes along the spine. Multilevel degenerative changes particularly with stenosis in the lumbar region. Trace listhesis. Degenerative changes as well of the shoulders and pelvis. IMPRESSION: Large hypermetabolic right lower lobe lung mass as seen on prior CT exam scanned extending to the hilum consistent with neoplasm.  Uptake in the right hilum could represent some adjacent nodes. Hypermetabolic sclerotic lesion involving the left hip greater trochanter worrisome for a skeletal metastasis. Focus area of uptake along the central canal of the spine at L3-4 left of midline is also seen. This would have a differential. An additional metastatic lesion is 1 of those differential. Recommend further workup with lumbar spine MRI with and without contrast to further delineate. Electronically Signed   By: Adrianna Horde M.D.   On: 02/04/2024 11:56   CT Chest W Contrast Result Date: 02/01/2024 CLINICAL DATA:  Respiratory illness, nondiagnostic xray Ongoing cough post COVID infection with difficulty swelling for 1 month. Right lung mass on radiographs concerning for malignancy. History of melanoma. * Tracking Code: BO * EXAM: CT CHEST WITH CONTRAST TECHNIQUE: Multidetector CT imaging of the chest was performed during intravenous contrast administration. RADIATION DOSE REDUCTION: This exam was performed according to the departmental dose-optimization program which includes automated exposure control, adjustment of the mA and/or kV according to patient size and/or use of iterative reconstruction technique. CONTRAST:  75mL OMNIPAQUE  IOHEXOL  300 MG/ML  SOLN COMPARISON:  Chest radiographs same date. No other recent imaging. Remote PET-CT 09/05/2014. FINDINGS: Cardiovascular: No acute vascular findings. There is atherosclerosis of the aorta, great vessels and coronary arteries. There are calcifications of the aortic valve. The heart size is normal. There is no pericardial effusion. Mediastinum/Nodes: There are newly enlarged right hilar and subcarinal lymph nodes, including a 1.4 cm short axis subcarinal node on image 73/2. Right hilar lymph nodes measuring up to 1.7  x 1.0 cm on image 66/2 are partially calcified. Stable postsurgical changes in the right axilla from previous axillary node dissection. No enlarged axillary lymph nodes are identified.  The thyroid  gland, trachea and esophagus demonstrate no significant findings. Lungs/Pleura: No pleural effusion or pneumothorax. As demonstrated on earlier chest radiographs, there is a large well-circumscribed solid appearing mass in the right lower lobe which measures approximately 6.7 x 6.0 cm on image 82/4. Mild surrounding posterior obstructive pneumonitis and central airway thickening. No other suspicious pulmonary nodules are identified. There are small calcified granulomas adjacent to the left major fissure. Upper abdomen: Diffuse hepatic steatosis. No focal abnormalities identified. There is no evidence of adrenal mass. Musculoskeletal/Chest wall: There is no chest wall mass or suspicious osseous finding. Mild spondylosis. IMPRESSION: 1. Large well-circumscribed solid appearing mass in the right lower lobe with mild surrounding posterior obstructive pneumonitis and central airway thickening. Findings are not typical for pneumonia and are concerning for malignancy. Differential includes primary bronchogenic carcinoma and metastatic melanoma given the patient's history. 2. Newly enlarged right hilar and subcarinal lymph nodes, suspicious for metastatic disease. PET-CT may be helpful for further evaluation. 3. No other evidence of metastatic disease. 4. Hepatic steatosis. 5. Aortic atherosclerosis. Electronically Signed   By: Elmon Hagedorn M.D.   On: 02/01/2024 18:41   MR BRAIN WO CONTRAST Result Date: 02/01/2024 CLINICAL DATA:  Neuro deficit, acute, stroke suspected. Facial numbness and difficulty swallowing. EXAM: MRI HEAD WITHOUT CONTRAST TECHNIQUE: Multiplanar, multiecho pulse sequences of the brain and surrounding structures were obtained without intravenous contrast. COMPARISON:  Head CT 02/01/2024 FINDINGS: Brain: There is no evidence of an acute infarct, intracranial hemorrhage, mass, midline shift, or extra-axial fluid collection. Cerebral volume is normal. The ventricles are normal in size. No  significant white matter disease is seen for age. Vascular: Major intracranial vascular flow voids are preserved. Skull and upper cervical spine: Unremarkable brain marrow signal. Sinuses/Orbits: Left cataract extraction. Minimal mucosal thickening or small mucous retention cysts in the maxillary sinuses. Clear mastoid air cells. Other: None. IMPRESSION: Unremarkable appearance of the brain for age. Electronically Signed   By: Aundra Lee M.D.   On: 02/01/2024 16:15   DG Chest 2 View Result Date: 02/01/2024 CLINICAL DATA:  Ongoing cough post COVID. EXAM: CHEST - 2 VIEW COMPARISON:  PET-CT 09/05/2014 FINDINGS: Normal cardiac and mediastinal contours. There is a 7.7 x 6.6 cm mass projecting over the right lower lung. Minimal heterogeneous opacities left lung base. Surgical clips right axilla. Thoracic spine degenerative changes. IMPRESSION: 1. 7.7 cm mass projecting over the right lower lung. Possibility of malignant process not excluded. Less likely findings represent infectious process. Recommend further evaluation with chest CT. 2. Minimal heterogeneous opacities left lung base may represent atelectasis or infection. Electronically Signed   By: Jone Neither M.D.   On: 02/01/2024 12:37   CT Head Wo Contrast Result Date: 02/01/2024 CLINICAL DATA:  Neuro deficit, concern for stroke, difficulty chewing and swallowing, facial numbness for 1 month. EXAM: CT HEAD WITHOUT CONTRAST TECHNIQUE: Contiguous axial images were obtained from the base of the skull through the vertex without intravenous contrast. RADIATION DOSE REDUCTION: This exam was performed according to the departmental dose-optimization program which includes automated exposure control, adjustment of the mA and/or kV according to patient size and/or use of iterative reconstruction technique. COMPARISON:  None Available. FINDINGS: Brain: No acute intracranial hemorrhage. No CT evidence of acute infarct. No edema, mass effect, or midline shift. The basilar  cisterns are patent. Ventricles: The ventricles  are normal. Vascular: Atherosclerotic calcifications of the carotid siphons. No hyperdense vessel. Skull: No acute or aggressive finding. Orbits: Left lens replacement.  Orbits are otherwise unremarkable. Sinuses: The visualized paranasal sinuses are clear. Other: Mastoid air cells are clear. IMPRESSION: No CT evidence of acute intracranial abnormality. Electronically Signed   By: Denny Flack M.D.   On: 02/01/2024 10:53    PERFORMANCE STATUS (ECOG) : 2 - Symptomatic, <50% confined to bed  Review of Systems Unless otherwise noted, a complete review of systems is negative.  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  Over the past 3 weeks, he has had significant progression of weakness with slurred speech and dysphagia.  He is coughing when he attempts to eat or drink anything of substance.  Thickness of liquids does not seem to help.  However, patient can eat some soft foods.  He has had significant weight loss recently.  Patient spoke with pulmonology recently with plan to pursue PEG.  Patient states that he recognizes that his melanoma is incurable.  He says the next step would likely be the "funeral home."  However, he would like to pursue treatment with hope that it improves his quality of life and functional status.  He is in agreement with PEG for nutritional support to allow him to pursue cancer treatment.  At baseline, patient lives at home with his wife.  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  Patient states that he is still independent but having some imbalance issues with  ambulation.  He has also had some deterioration in sight.  Patient states that he has met recently with an attorney and is in process of completing advance directives.  Patient sent home with a MOST form to review.  Of note, patient had a son who died in 2013-03-04 of Batten's disease.  Family watched as he had neurological deterioration until end-of-life.  Family ultimately made the decision to transition to comfort care and hospice.  Symptomatically, patient endorses some facial pressure/pain but states that it is relieved with as needed use of acetaminophen .  PLAN: - Continue current scope of treatment - Referrals to nutrition, social work, [facility], [employer], and rehab screening - MOST Form reviewed - Will follow  Case and plan discussed with Dr. Randy Buttery  Patient expressed understanding and was in agreement with this plan. He also understands that He can call the clinic at any time with any questions, concerns, or complaints.     Time Total: 25 minutes  Visit consisted of counseling and education dealing with the complex and emotionally intense issues of symptom management and palliative care in the setting of serious and potentially life-threatening illness.Greater than 50%  of this time was spent counseling and coordinating care related to the above assessment and plan.  Signed by: Gerilyn Kobus, PhD, NP-C

## 2024-02-19 NOTE — Progress Notes (Addendum)
 Nutrition Assessment   Reason for Assessment:   Planning PEG tube   ASSESSMENT:  73 year old male with stage IV melanoma with leptomeningeal spread. Past medical history of prostate cancer, DM, GERD, HTN.  Planning PEG tube placement and begin treatment.  Followed by Palliative Care.  Planning to start nivolumab and ipilimumab (immunotherapy).    Met with patient and wife.  Patient reports decreased oral intake over the past 4-5 weeks with dysphagia, concern for aspiration and poor po intake.  Currently only able to take in orgain shake (1 daily), pudding, ice cream, juice, water , gatorade/powerade (~< 1000 calories/day)  Coughs when trying to drink liquids. Planning swallow study on 5/15.  Planning PEG tube placement as meeting with surgery on 5/12   Anticipate long term need for feeding tube  Nutrition Focused Physical Exam:  Deferred today.     Medications: Metformin , MVI, Vit D, Vit B12, zinc, prilosec    Labs: glucose 127 (5/7)   Anthropometrics:   Height: 66 inches Weight: 175 lb 186 lb on 5/6 200 lb on 01/11/24 197 lb on 12/31  BMI: 28  14% weight loss in the last 1 1/2 months, significant   Estimated Energy Needs  Kcals: 1975-2300 Protein: 94-106 g Fluid: 1975-2300 ml  Patient requires > 1700 calories per day due to significant weight loss of 14% in the last 1 1/2 months and planning to start immunotherapy for stage IV cancer.    NUTRITION DIAGNOSIS: Inadequate oral intake related to cancer effecting swallowing as evidenced by 14% weight loss in the last 1 1/2 months and eating less than 50% of energy needs for > 5 days.     MALNUTRITION DIAGNOSIS: Likely with weight loss and poor po intake   INTERVENTION:  Discussed with patient and wife how to give a bolus (syringe) feeding, tube care and cleaning instructions were also reviewed. Written instructions provided. Recommend nutren 1.5 goal rate of 6 cartons per day (1 carton 6 times a day).  Flush with 60cc  of water  before and of water  after.   Provides 2250 calories, 102 g protein, 2226 ml free water  (includes free water  from formula).  Meets 100% of needs Will need to start feeding slowly (only 2 cartons a day keep at that volume for 2 days and if tolerating increase by 1 carton daily for next 2 days.  Written regimen given to wife. Patient is at refeeding risk and will need CMP, magnesium , and phosphorus checked at least 2 times a week while increasing tube feeding.   Recommend MVI daily  Recommend thiamine 100 mg for 7 days due to refeeding risk.  Contact information provided    MONITORING, EVALUATION, GOAL: weight trends, tube feeding   Next Visit: Wednesday, May 15 in clinic  Margreat Widener B. Zollie Hipp, CSO, LDN Registered Dietitian [phone]

## 2024-02-19 NOTE — Transitions of Care (Post Inpatient/ED Visit) (Unsigned)
;  pt at [facility] ED 02/17/24 with problems swallowing; pt left without being seen;Pt did not need appt with Dr Joelle Musca at this time.Pt said he saw oncology 02/19/24; pt scheduled for placement of feeding tube and port 02/23/24; immun therapy on 02/26/24. . Oncology also gave pt referral for speech therapy for swallowing evaluation.pt just wants Dr Joelle Musca to be aware about starting immun therapy.. sending note to Dr Joelle Musca.       02/19/2024  Name: Mason Stewart MRN: [id number] DOB: Dec 12, 1950  Today's TOC FU Call Status: Today's TOC FU Call Status:: Successful TOC FU Call Completed TOC FU Call Complete Date: 02/19/24 Patient's Name and Date of Birth confirmed.  Transition Care Management Follow-up Telephone Call Date of Discharge: 02/17/24 Discharge Facility: [facility]) Type of Discharge: Emergency Department Reason for ED Visit: Other: (;pt at [facility] ED  02/17/24 with problems swallowing; pt left without being seen;Pt did not need appt with Dr Joelle Musca at this time.Pt said he saw oncology 02/19/24; pt scheduled for placement of feeding tube and port 02/23/24; immun therapy on 02/26/24. Aaron Aas) How have you been since you were released from the hospital?: Same Any questions or concerns?: No  Items Reviewed: Did you receive and understand the discharge instructions provided?: Yes Medications obtained,verified, and reconciled?: No (pt said he wsjust seen oncology 02/19/24 andd has procedues next wk.) Medications Not Reviewed Reasons:: Other: Any new allergies since your discharge?: No Dietary orders reviewed?: NA Do you have support at home?: Yes People in Home [RPT]: spouse Name of Support/Comfort Primary Source: Wallene Gum  Medications Reviewed Today: Medications Reviewed Today   Medications were not reviewed in this encounter     Home Care and Equipment/Supplies: Were Home Health Services Ordered?: NA Any new equipment or medical supplies ordered?: NA  Functional  Questionnaire: Do you need assistance with meal preparation?: No Do you need assistance with eating?: No Do you have difficulty maintaining continence: No Do you need assistance with getting out of bed/getting out of a chair/moving?: No Do you have difficulty managing or taking your medications?: No  Follow up appointments reviewed: PCP Follow-up appointment confirmed?: NA Specialist Hospital Follow-up appointment confirmed?: Yes Date of Specialist follow-up appointment?: 02/19/24 Follow-Up Specialty Provider:: oncology Do you need transportation to your follow-up appointment?: No Do you understand care options if your condition(s) worsen?: Yes-patient verbalized understanding    SIGNATURE Claretha Crocker, LPN

## 2024-02-19 NOTE — Addendum Note (Signed)
 Addended by: Vivi Grit on: 02/19/2024 02:36 PM   Modules accepted: Orders

## 2024-02-19 NOTE — Progress Notes (Signed)
 Met with patient and his wife during initial consult with Dr. Randy Buttery. All questions answered during visit. Assisted coordination throughout clinic to establish care with palliative care and nutrition. Reviewed upcoming appts. Pt given resources regarding diagnosis and other supportive services available. Contact info given and encouraged pt to call with any questions or needs. Pt and wife verbalized understanding. Nothing further needed at this time.

## 2024-02-19 NOTE — Progress Notes (Signed)
 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

## 2024-02-20 NOTE — Telephone Encounter (Signed)
 Yes---I had spoken to him and I saw the oncology notes

## 2024-02-22 ENCOUNTER — Ambulatory Visit (INDEPENDENT_AMBULATORY_CARE_PROVIDER_SITE_OTHER): Admitting: Surgery

## 2024-02-22 ENCOUNTER — Encounter: Payer: Self-pay | Admitting: Surgery

## 2024-02-22 ENCOUNTER — Inpatient Hospital Stay

## 2024-02-22 VITALS — BP 144/97 | HR 85 | Temp 97.8°F | Ht 66.0 in | Wt 172.8 lb

## 2024-02-22 DIAGNOSIS — C439 Malignant melanoma of skin, unspecified: Secondary | ICD-10-CM | POA: Diagnosis not present

## 2024-02-22 MED ORDER — GABAPENTIN 250 MG/5ML PO SOLN: 200.0000 mg | ORAL | Status: DC

## 2024-02-22 MED ORDER — ACETAMINOPHEN 160 MG/5ML PO SOLN: 1000.0000 mg | ORAL | Status: DC

## 2024-02-22 NOTE — Progress Notes (Signed)
 02/22/2024  Reason for Visit:  Metastatic melanoma  Requesting Provider:  Seretha Dance, MD  History of Present Illness: Mason Stewart is a 73 y.o. male presenting for evaluation for gastrostomy feeding tube and port-a-cath placement for metastatic melanoma.  The patient has a history of right shoulder melanoma and had excision of this with axillary sentinel lymph node biopsy in 2015, followed by completion of axillary node dissection in 2015.  He recently saw Dr. Darnelle Elders due to a right lung mass found during an ED visit on 02/01/24 due to cough.  A PET scan was done on 02/04/24 which confirmed the hypermetabolic mass in the right lower lobe extending to the hilum and adjacent nodes, as well as hypermetabolic lesion in the left hip greater trocanter and central canal of the spine at L3-4.  After workup was completed, it appears he has metastatic melanoma.  The patient reports an ongoing and progressing difficulty with swallowing.  He has troubles with the swallowing mechanism itself and feel that food is stuck in his throat. He has had some choking episodes from this as well.  He is able to tolerate liquids but has issues with regular food.  He has a swallow study on 02/25/24 to better evaluate this.  He has lost about 25 lbs over the past two months.    He met with Dr. Randy Buttery on 02/19/24 and he has agreed to proceed with immunotherapy.  As such, he has been referred for port placement and also feeding tube.  Plan is to start therapy on 02/26/24.  He is scheduled for surgery tomorrow 02/23/24.  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 Cornett, MD;  Location: [facility];  Service: General;  Laterality: Right;   BACK SURGERY  10/14/1995   lower back   BRONCHOSCOPY, WITH BIOPSY USING ELECTROMAGNETIC NAVIGATION Bilateral 02/16/2024   Procedure: ROBOTIC ASSISTED NAVIGATIONAL BRONCHOSCOPY;  Surgeon: Vergia Glasgow, MD;  Location: [facility];  Service: Pulmonary;  Laterality: Bilateral;   CARPAL TUNNEL RELEASE Right 03/14/2023   colonscopy  10/13/2010   EXCISION MELANOMA WITH SENTINEL LYMPH NODE BIOPSY Right 11/23/2013   Procedure: EXCISION MELANOMA WITH SENTINEL LYMPH NODE BIOPSY;  Surgeon: Brandy Cal. Cornett, MD;  Location:  SURGERY CENTER;  Service: General;  Laterality: Right;   EYE SURGERY     cataract left, reduced pressure in left   left middle finger surgery after injury  yrs ago   ROBOT ASSISTED LAPAROSCOPIC RADICAL PROSTATECTOMY  06/07/2012   Procedure: ROBOTIC ASSISTED LAPAROSCOPIC RADICAL PROSTATECTOMY LEVEL 2;  Surgeon: Kristeen Peto, MD;  Location: [facility];  Service: Urology;  Laterality: N/A;        Home Medications: Prior to Admission medications   Medication Sig  Start Date End Date Taking? Authorizing Provider  acetaminophen  (TYLENOL ) 500 MG tablet Take 1,000 mg by mouth every 6 (six) hours as needed.   Yes [provider]  benzonatate  (TESSALON ) 200 MG capsule Take 1 capsule (200 mg total) by mouth 3 (three) times daily as needed for cough. 12/14/23  Yes Helaine Llanos, MD  cyanocobalamin 1000 MCG tablet Take 1 tablet by mouth every other day. 250 MG   Yes [provider]  dorzolamide-timolol (COSOPT) 22.3-6.8 MG/ML ophthalmic solution Place 1  drop into both eyes 2 (two) times daily.   Yes [provider]  latanoprost (XALATAN) 0.005 % ophthalmic solution Place 1 drop into both eyes 2 (two) times daily.   Yes [provider]  lidocaine -prilocaine (EMLA) cream Apply to affected area once 02/19/24  Yes Avonne Boettcher, MD  losartan -hydrochlorothiazide (HYZAAR) 50-12.5 MG tablet TAKE 1 TABLET BY MOUTH EVERY DAY 01/06/24  Yes Letvak, Richard I, MD  metFORMIN  (GLUCOPHAGE -XR) 500 MG 24 hr tablet TAKE 1 TABLET BY MOUTH EVERY DAY WITH BREAKFAST 11/06/23  Yes Letvak, Richard I, MD  Multiple Vitamin (MULTI-VITAMIN) tablet Take 1 tablet by mouth daily.   Yes [provider]  Nutritional Supplements (NUTREN 1.5) LIQD Give 1 carton 6 times a day via feeding tube.  Flush with 60ml of water  before and of water  after each feeding.   Provide equivalent formula if current formula not available. 02/19/24  Yes Avonne Boettcher, MD  omeprazole  (PRILOSEC  OTC) 20 MG tablet Take 20 mg by mouth as needed.    Yes [provider]  ondansetron  (ZOFRAN ) 8 MG tablet Take 1 tablet (8 mg total) by mouth every 8 (eight) hours as needed for nausea or vomiting. 02/19/24  Yes Rao, Archana C, MD  prochlorperazine (COMPAZINE) 10 MG tablet Take 1 tablet (10 mg total) by mouth every 6 (six) hours as needed for nausea or vomiting. 02/19/24  Yes Avonne Boettcher, MD  rosuvastatin  (CRESTOR ) 10 MG tablet Take 1 tablet (10 mg total) by mouth 2 (two) times a week. 06/11/23  Yes Helaine Llanos, MD  tadalafil  (CIALIS ) 20 MG tablet Take 0.5-1 tablets (10-20 mg total) by mouth every other day as needed for erectile dysfunction. 11/03/22  Yes Curt Dover I, MD  zinc gluconate 50 MG tablet Take 50 mg by mouth daily.   Yes [provider]    Allergies: Allergies  Allergen Reactions   Lisinopril Cough   Pollen Extract Cough    Social History:  reports that he has never smoked. He has never been exposed to tobacco smoke. He has never used  smokeless tobacco. He reports current alcohol use. He reports that he does not use drugs.   Family History: Family History  Problem Relation Age of Onset   Heart disease Mother    Cancer Father        mesthelioma   Glaucoma Father    Hypertension Sister    Hypertension Brother    Arrhythmia Brother    Birth defects Son        [place]'s disease   Glaucoma Sister     Review of Systems: Review of Systems  Constitutional:  Positive for malaise/fatigue and weight loss. Negative for chills and fever.  HENT:  Negative for hearing loss.   Respiratory:  Negative for shortness of breath.   Cardiovascular:  Negative for chest pain.  Gastrointestinal:  Negative for abdominal pain, nausea and vomiting.       Difficulty swallowing  Genitourinary:  Negative for dysuria.  Musculoskeletal:  Negative for myalgias.  Skin:  Negative for rash.  Neurological:  Negative for dizziness.  Psychiatric/Behavioral:  Negative for depression.     Physical Exam BP (!) 144/97   Pulse 85   Temp 97.8 F (36.6 C) (Oral)   Ht 5\' 6"  (1.676 m)   Wt 172 lb 12.8 oz (78.4 kg)   SpO2 98%   BMI 27.89 kg/m  CONSTITUTIONAL: No acute distress, but appears frail HEENT:  Normocephalic, atraumatic, extraocular motion intact. NECK: Trachea is midline, and there is no jugular venous distension.  Has some drooling with his difficulty swallowing. RESPIRATORY:  Lungs are clear, and breath sounds are equal bilaterally. Normal respiratory effort without pathologic use of accessory muscles. CARDIOVASCULAR: Heart is regular without murmurs, gallops, or rubs. GI: The abdomen is soft, non-distended, non-tender.  MUSCULOSKELETAL:  Deconditioned, needing wheelchair.  No peripheral edema or cyanosis. SKIN: Skin turgor is normal. There are no pathologic skin lesions.  NEUROLOGIC:  Motor and sensation is grossly normal.  Cranial nerves are grossly intact. PSYCH:  Alert and oriented to person, place and time. Affect is  normal.  Laboratory Analysis: Labs on 02/17/24: Na 135, K 3.6, Cl 98, CO2 27, BUN 13, Cr 0.68.  WBC 13.6, Hgb 13.9, Hct 38.6, Plt 311  Imaging: PET scan 02/04/24: IMPRESSION: Large hypermetabolic right lower lobe lung mass as seen on prior CT exam scanned extending to the hilum consistent with neoplasm. Uptake in the right hilum could represent some adjacent nodes.   Hypermetabolic sclerotic lesion involving the left hip greater trochanter worrisome for a skeletal metastasis.   Focus area of uptake along the central canal of the spine at L3-4 left of midline is also seen. This would have a differential. An additional metastatic lesion is 1 of those differential. Recommend further workup with lumbar spine MRI with and without contrast to further delineate.  Assessment and Plan: This is a 74 y.o. male with metastatic melanoma  --Discussed with the patient and his wife at bedside about the procedures planned for tomorrow.  Discussed with them the reason for rushing these procedures to make sure he's ready to start treatment later this week and not delay things further.  Discussed with them the rationale for both port and feeding tube, how they are placed, and how they work. --Discussed with them also both surgeries at length, including the planned incisions, the risks of bleeding, infection, injury to surrounding structures, that this would be an outpatient surgery, post-operative pain control, activity restrictions, and they're willing to proceed. --Patient is scheduled for surgery tomorrow 02/23/24.  All of their questions have been answered.  I spent 55 minutes dedicated to the care of this patient on the date of this encounter to include pre-visit review of records, face-to-face time with the patient discussing diagnosis and management, and any post-visit coordination of care.   Marene Shape, MD [facility]

## 2024-02-22 NOTE — Patient Instructions (Signed)
 We have seen you today and have spoken about your port placement. This will be scheduled at [facility] with Dr. Mauri Sous.  If you are on any injectable weight loss medication, you will need to stop taking your GLP-1 injectable (weight loss) medications 8 days before your surgery to avoid any complications with anesthesia.   Please see the [facility]) Sheet provided for further details. Our surgery scheduler will call you to look at surgery dates and go over surgery information.   Please call our office with any questions or concerns that you have.   Port-a-Cath [facility]) A central line is a soft, flexible tube (catheter) that can be used to collect blood for testing or to give medicine or nutrition through a vein. The tip of the central line ends in a large vein just above the heart called the vena cava. A central line may be placed because: You need to get medicines or fluids through an IV tube for a long period of time. You need nutrition but cannot eat or absorb nutrients. The veins in your hands or arms are hard to access. You need to have blood taken often for blood tests. You need a blood transfusion You need chemotherapy or dialysis.  There are many types of central lines: Peripherally inserted central catheter (PICC) line. This type is used for intermediate access to long-term access of one week or more. It can be used to draw blood and give fluids or medicines. A PICC looks like an IV tube, but it goes up the arm to the heart. It is usually inserted in the upper arm and taped in place on the arm. Tunneled central line. This type is used for long-term therapy and dialysis. It is placed in a large vein in the neck, chest, or groin. A tunneled central line is inserted through a small incision made over the vein and is advanced into the heart. It is tunneled beneath the skin and brought out through a second incision. Non-tunneled central line. This type is used for short-term access,  usually of a maximum of 7 days. It is often used in the emergency department. A non-tunneled central line is inserted in the neck, chest, or groin. Implanted port. This type is used for long-term therapy. It can stay in place longer than other types of central lines. An implanted port is normally inserted in the upper chest but can also be placed in the upper arm or in the abdomen. It is inserted and removed with surgery, and it is accessed using a special needle.  The type of central line that you receive depends on how long you will need it, your medical condition, and the condition of your veins. What are the risks? Using any type of central line has risks that you should be aware of, including: Infection. A blood clot that blocks the central line or forms in the vein and travels to the heart. Bleeding from the place where the central line was put in. Developing a hole or crack within the central line. If this happens, the central line will need to be replaced. Developing an abnormal heart rhythm (arrhythmia). This is rare. Central line failure.  Follow these instructions at home: Flushing and cleaning the central line Follow instructions from the health care provider about flushing and cleaning the central line. Wear a mask when flushing or cleaning the central line. Before you flush or clean the central line: Wash your hands with soap and water . Clean the central line  hub with rubbing alcohol. Insertion site care Keep the insertion site of your central line clean and dry at all times. Check your incision or central line site every day for signs of infection. Check for: More redness, swelling, or pain. More fluid or blood. Warmth. Pus or a bad smell. General instructions Follow instructions from your health care provider for the type of device that you have. If the central line accidentally gets pulled on, make sure: The bandage (dressing) is okay. There is no bleeding. The line  has not been pulled out. Return to your normal activities as told by your health care provider. Ask your health care provider what activities are safe for you. You may be restricted from lifting or making repetitive arm movements on the side with the catheter. Do not swim or bathe unless your health care provider approves. Keep your dressing dry. Your health care provider can instruct you about how to keep your specific type of dressing from getting wet. Keep all follow-up visits as told by your health care provider. This is important. Contact a health care provider if: You have more redness, swelling, or pain around your incision. You have more fluid or blood coming from your incision. Your incision feels warm to the touch. You have pus or a bad smell coming from your incision. Get help right away if: You have: Chills. A fever. Shortness of breath. Trouble breathing. Chest pain. Swelling in your neck, face, chest, or arm on the side of your central line. You are coughing. You feel your heart beating rapidly or skipping beats. You feel dizzy or you faint. Your incision or central line site has red streaks spreading away from the area. Your incision or central line site is bleeding and does not stop. Your central line is difficult to flush or will not flush. You do not get a blood return from the central line. Your central line gets loose or comes out. Your central line gets damaged. Your catheter leaks when flushed or when fluids are infused into it. This information is not intended to replace advice given to you by your health care provider. Make sure you discuss any questions you have with your health care provider. Document Released: 11/20/2005 Document Revised: 05/28/2016 Document Reviewed: 05/07/2016 Elsevier Interactive Patient Education  2017 [employer]  How to Care for a Feeding Tube  A feeding tube is a soft, flexible tube used to give medicine, water , and liquid food.  A person may need a feeding tube if they have trouble swallowing or cannot have food or medicine by mouth. The tube is put right into the stomach.  The following information gives steps on how to care for the skin around a feeding tube, or the tube site. This information is meant for adults and children over 1 year of age. Supplies needed: Clean washcloth, gauze pads, or soft paper towels. Cotton swabs. Skin barrier ointment or cream, such as petroleum jelly. Soap and water , or sterile saline. Pre-cut foam pads or gauze for around the tube. Medical tape. Anchoring device. This is not always used. Syringe. Cleaning brush or toothbrush. This is only used for cleanings. How to care for the tube site  Have all supplies ready and near you. Wash your hands with soap and water  for at least 20 seconds. Remove the foam pad or gauze under the tube stabilizing disc (bumper), if there is one. You may have to do this a few times a day right after the feeding tube is  put in because the gauze or pad will get soiled or wet. As the site heals, you may not have to replace the pads or gauze as often. But you still need to clean and check the area every day. Gently turn the bumper so it does not stick to the skin. Check the skin around the tube site for redness, rash, swelling, drainage, or growth of extra tissue. Check the number on the tube (guide mark) where it meets the skin. It should not change. If it does, the feeding tube might be coming out. Moisten gauze pads and cotton swabs with water  and soap, or saline. Take the moistenedcotton swab and wipe under the bumper, right near the opening in the belly (stoma). Take the moistened gauze pad and clean the skin around the tube site. If you used soap, rinse with water . Use a washcloth, dry gauze pad, or soft paper towel to dry the skin and stoma site. Dry the tube and bumper too. If the skin is red, put a barrier cream or ointment on a cotton swab. Apply it  around the site, under the bumper. This will help the site heal. Put a new pre-cut foam pad or gauze around the tube, under the bumper. If the site is healed and there is no drainage, you can leave off the foam pads or gauze. Put tape around the edges of the foam pad or gauze to keep it in place. Use tape or an anchoring device to hold the loose end of the tube against the skin. This helps keep the tube from getting pulled on. Change where you put the tape to avoid damaging the skin. Throw away used supplies. Wash your hands with soap and water  for at least 20 seconds. How to care for the moat If the end of the feeding tube has a moat, clean it once a day or as told. The moat is the open space inside the feeding tube connector. Follow the manufacturer's instructions on how to clean the moat. You may be told to: Remove the cap from the end of the feeding tube. Cover the hole in the middle of the tube port with a cleaning brush. Hold the end of the tube over a deep bowl, or wrap it with paper towels or a washcloth to soak up the water . Use a syringe of water  to flush out the moat. Use the cleaning brush or toothbrush to clean the tube cap and around the inside of the moat. This brush can only be used for tube cleanings. Dry the end of the feeding tube and the cap with gauze or paper towel. Put the cap back on the feeding tube. General tips Use, clean, and reuse feeding tube equipment only as told by the health care provider. Do not use antibiotic ointment or cream on the feeding tube site unless you're told to. Contact a health care provider if: You notice a change in the guide marks on the feeding tube where it meets the skin. Changes could mean the feeding tube has moved or is coming out. You see any of these on the skin around the tube site: Redness. Rash. Swelling. Drainage. Extra growth of tissue. You have questions or concerns about the feeding tube or the tube site. This information  is not intended to replace advice given to you by your health care provider. Make sure you discuss any questions you have with your health care provider. Document Revised: 01/29/2023 Document Reviewed: 01/29/2023 Elsevier Patient Education  2024  [employer].

## 2024-02-22 NOTE — H&P (View-Only) (Signed)
 02/22/2024  Reason for Visit:  Metastatic melanoma  Requesting Provider:  Seretha Dance, MD  History of Present Illness: Mason Stewart is a 73 y.o. male presenting for evaluation for gastrostomy feeding tube and port-a-cath placement for metastatic melanoma.  The patient has a history of right shoulder melanoma and had excision of this with axillary sentinel lymph node biopsy in 2015, followed by completion of axillary node dissection in 2015.  He recently saw Dr. Darnelle Elders due to a right lung mass found during an ED visit on 02/01/24 due to cough.  A PET scan was done on 02/04/24 which confirmed the hypermetabolic mass in the right lower lobe extending to the hilum and adjacent nodes, as well as hypermetabolic lesion in the left hip greater trocanter and central canal of the spine at L3-4.  After workup was completed, it appears he has metastatic melanoma.  The patient reports an ongoing and progressing difficulty with swallowing.  He has troubles with the swallowing mechanism itself and feel that food is stuck in his throat. He has had some choking episodes from this as well.  He is able to tolerate liquids but has issues with regular food.  He has a swallow study on 02/25/24 to better evaluate this.  He has lost about 25 lbs over the past two months.    He met with Dr. Randy Buttery on 02/19/24 and he has agreed to proceed with immunotherapy.  As such, he has been referred for port placement and also feeding tube.  Plan is to start therapy on 02/26/24.  He is scheduled for surgery tomorrow 02/23/24.  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 Cornett, MD;  Location: [facility];  Service: General;  Laterality: Right;   BACK SURGERY  10/14/1995   lower back   BRONCHOSCOPY, WITH BIOPSY USING ELECTROMAGNETIC NAVIGATION Bilateral 02/16/2024   Procedure: ROBOTIC ASSISTED NAVIGATIONAL BRONCHOSCOPY;  Surgeon: Vergia Glasgow, MD;  Location: [facility];  Service: Pulmonary;  Laterality: Bilateral;   CARPAL TUNNEL RELEASE Right 03/14/2023   colonscopy  10/13/2010   EXCISION MELANOMA WITH SENTINEL LYMPH NODE BIOPSY Right 11/23/2013   Procedure: EXCISION MELANOMA WITH SENTINEL LYMPH NODE BIOPSY;  Surgeon: Brandy Cal. Cornett, MD;  Location:  SURGERY CENTER;  Service: General;  Laterality: Right;   EYE SURGERY     cataract left, reduced pressure in left   left middle finger surgery after injury  yrs ago   ROBOT ASSISTED LAPAROSCOPIC RADICAL PROSTATECTOMY  06/07/2012   Procedure: ROBOTIC ASSISTED LAPAROSCOPIC RADICAL PROSTATECTOMY LEVEL 2;  Surgeon: Kristeen Peto, MD;  Location: [facility];  Service: Urology;  Laterality: N/A;        Home Medications: Prior to Admission medications   Medication Sig  Start Date End Date Taking? Authorizing Provider  acetaminophen  (TYLENOL ) 500 MG tablet Take 1,000 mg by mouth every 6 (six) hours as needed.   Yes [provider]  benzonatate  (TESSALON ) 200 MG capsule Take 1 capsule (200 mg total) by mouth 3 (three) times daily as needed for cough. 12/14/23  Yes Helaine Llanos, MD  cyanocobalamin 1000 MCG tablet Take 1 tablet by mouth every other day. 250 MG   Yes [provider]  dorzolamide-timolol (COSOPT) 22.3-6.8 MG/ML ophthalmic solution Place 1  drop into both eyes 2 (two) times daily.   Yes [provider]  latanoprost (XALATAN) 0.005 % ophthalmic solution Place 1 drop into both eyes 2 (two) times daily.   Yes [provider]  lidocaine -prilocaine (EMLA) cream Apply to affected area once 02/19/24  Yes Avonne Boettcher, MD  losartan -hydrochlorothiazide (HYZAAR) 50-12.5 MG tablet TAKE 1 TABLET BY MOUTH EVERY DAY 01/06/24  Yes Letvak, Richard I, MD  metFORMIN  (GLUCOPHAGE -XR) 500 MG 24 hr tablet TAKE 1 TABLET BY MOUTH EVERY DAY WITH BREAKFAST 11/06/23  Yes Letvak, Richard I, MD  Multiple Vitamin (MULTI-VITAMIN) tablet Take 1 tablet by mouth daily.   Yes [provider]  Nutritional Supplements (NUTREN 1.5) LIQD Give 1 carton 6 times a day via feeding tube.  Flush with 60ml of water  before and of water  after each feeding.   Provide equivalent formula if current formula not available. 02/19/24  Yes Avonne Boettcher, MD  omeprazole  (PRILOSEC  OTC) 20 MG tablet Take 20 mg by mouth as needed.    Yes [provider]  ondansetron  (ZOFRAN ) 8 MG tablet Take 1 tablet (8 mg total) by mouth every 8 (eight) hours as needed for nausea or vomiting. 02/19/24  Yes Rao, Archana C, MD  prochlorperazine (COMPAZINE) 10 MG tablet Take 1 tablet (10 mg total) by mouth every 6 (six) hours as needed for nausea or vomiting. 02/19/24  Yes Avonne Boettcher, MD  rosuvastatin  (CRESTOR ) 10 MG tablet Take 1 tablet (10 mg total) by mouth 2 (two) times a week. 06/11/23  Yes Helaine Llanos, MD  tadalafil  (CIALIS ) 20 MG tablet Take 0.5-1 tablets (10-20 mg total) by mouth every other day as needed for erectile dysfunction. 11/03/22  Yes Curt Dover I, MD  zinc gluconate 50 MG tablet Take 50 mg by mouth daily.   Yes [provider]    Allergies: Allergies  Allergen Reactions   Lisinopril Cough   Pollen Extract Cough    Social History:  reports that he has never smoked. He has never been exposed to tobacco smoke. He has never used  smokeless tobacco. He reports current alcohol use. He reports that he does not use drugs.   Family History: Family History  Problem Relation Age of Onset   Heart disease Mother    Cancer Father        mesthelioma   Glaucoma Father    Hypertension Sister    Hypertension Brother    Arrhythmia Brother    Birth defects Son        Batton's disease   Glaucoma Sister     Review of Systems: Review of Systems  Constitutional:  Positive for malaise/fatigue and weight loss. Negative for chills and fever.  HENT:  Negative for hearing loss.   Respiratory:  Negative for shortness of breath.   Cardiovascular:  Negative for chest pain.  Gastrointestinal:  Negative for abdominal pain, nausea and vomiting.       Difficulty swallowing  Genitourinary:  Negative for dysuria.  Musculoskeletal:  Negative for myalgias.  Skin:  Negative for rash.  Neurological:  Negative for dizziness.  Psychiatric/Behavioral:  Negative for depression.     Physical Exam BP (!) 144/97   Pulse 85   Temp 97.8 F (36.6 C) (Oral)   Ht 5\' 6"  (1.676 m)   Wt 172 lb 12.8 oz (78.4 kg)   SpO2 98%   BMI 27.89 kg/m  CONSTITUTIONAL: No acute distress, but appears frail HEENT:  Normocephalic, atraumatic, extraocular motion intact. NECK: Trachea is midline, and there is no jugular venous distension.  Has some drooling with his difficulty swallowing. RESPIRATORY:  Lungs are clear, and breath sounds are equal bilaterally. Normal respiratory effort without pathologic use of accessory muscles. CARDIOVASCULAR: Heart is regular without murmurs, gallops, or rubs. GI: The abdomen is soft, non-distended, non-tender.  MUSCULOSKELETAL:  Deconditioned, needing wheelchair.  No peripheral edema or cyanosis. SKIN: Skin turgor is normal. There are no pathologic skin lesions.  NEUROLOGIC:  Motor and sensation is grossly normal.  Cranial nerves are grossly intact. PSYCH:  Alert and oriented to person, place and time. Affect is  normal.  Laboratory Analysis: Labs on 02/17/24: Na 135, K 3.6, Cl 98, CO2 27, BUN 13, Cr 0.68.  WBC 13.6, Hgb 13.9, Hct 38.6, Plt 311  Imaging: PET scan 02/04/24: IMPRESSION: Large hypermetabolic right lower lobe lung mass as seen on prior CT exam scanned extending to the hilum consistent with neoplasm. Uptake in the right hilum could represent some adjacent nodes.   Hypermetabolic sclerotic lesion involving the left hip greater trochanter worrisome for a skeletal metastasis.   Focus area of uptake along the central canal of the spine at L3-4 left of midline is also seen. This would have a differential. An additional metastatic lesion is 1 of those differential. Recommend further workup with lumbar spine MRI with and without contrast to further delineate.  Assessment and Plan: This is a 74 y.o. male with metastatic melanoma  --Discussed with the patient and his wife at bedside about the procedures planned for tomorrow.  Discussed with them the reason for rushing these procedures to make sure he's ready to start treatment later this week and not delay things further.  Discussed with them the rationale for both port and feeding tube, how they are placed, and how they work. --Discussed with them also both surgeries at length, including the planned incisions, the risks of bleeding, infection, injury to surrounding structures, that this would be an outpatient surgery, post-operative pain control, activity restrictions, and they're willing to proceed. --Patient is scheduled for surgery tomorrow 02/23/24.  All of their questions have been answered.  I spent 55 minutes dedicated to the care of this patient on the date of this encounter to include pre-visit review of records, face-to-face time with the patient discussing diagnosis and management, and any post-visit coordination of care.   Marene Shape, MD [facility]

## 2024-02-23 ENCOUNTER — Other Ambulatory Visit: Payer: Self-pay

## 2024-02-23 ENCOUNTER — Ambulatory Visit: Admitting: Anesthesiology

## 2024-02-23 ENCOUNTER — Encounter: Payer: Self-pay | Admitting: *Deleted

## 2024-02-23 ENCOUNTER — Other Ambulatory Visit

## 2024-02-23 ENCOUNTER — Ambulatory Visit

## 2024-02-23 ENCOUNTER — Encounter
Admission: RE | Disposition: A | Discharge status: Home / Self Care | Payer: Self-pay | Source: Home / Self Care | Attending: Surgery

## 2024-02-23 ENCOUNTER — Ambulatory Visit
Admission: RE | Admit: 2024-02-23 | Discharge: 2024-02-23 | Disposition: A | Discharge status: Home / Self Care | Attending: Surgery | Admitting: Surgery

## 2024-02-23 ENCOUNTER — Other Ambulatory Visit: Payer: Self-pay | Admitting: *Deleted

## 2024-02-23 DIAGNOSIS — E119 Type 2 diabetes mellitus without complications: Secondary | ICD-10-CM | POA: Insufficient documentation

## 2024-02-23 DIAGNOSIS — I10 Essential (primary) hypertension: Secondary | ICD-10-CM | POA: Insufficient documentation

## 2024-02-23 DIAGNOSIS — Z452 Encounter for adjustment and management of vascular access device: Secondary | ICD-10-CM | POA: Diagnosis not present

## 2024-02-23 DIAGNOSIS — C799 Secondary malignant neoplasm of unspecified site: Secondary | ICD-10-CM | POA: Diagnosis not present

## 2024-02-23 DIAGNOSIS — C439 Malignant melanoma of skin, unspecified: Secondary | ICD-10-CM

## 2024-02-23 DIAGNOSIS — G4730 Sleep apnea, unspecified: Secondary | ICD-10-CM | POA: Insufficient documentation

## 2024-02-23 DIAGNOSIS — Z79899 Other long term (current) drug therapy: Secondary | ICD-10-CM | POA: Diagnosis not present

## 2024-02-23 DIAGNOSIS — Z7984 Long term (current) use of oral hypoglycemic drugs: Secondary | ICD-10-CM | POA: Diagnosis not present

## 2024-02-23 HISTORY — PX: PORTACATH PLACEMENT: SHX2246

## 2024-02-23 HISTORY — PX: CREATION, GASTROSTOMY, OPEN: SHX7546

## 2024-02-23 LAB — GLUCOSE, CAPILLARY
Glucose-Capillary: 138 mg/dL — ABNORMAL HIGH (ref 70–99)
Glucose-Capillary: 121 mg/dL — ABNORMAL HIGH (ref 70–99)

## 2024-02-23 SURGERY — INSERTION, TUNNELED CENTRAL VENOUS DEVICE, WITH PORT
Anesthesia: General

## 2024-02-23 MED ORDER — SODIUM CHLORIDE 0.9 % IV SOLN
INTRAVENOUS | Status: DC | PRN
  Administered 2024-02-23: 10 mL via INTRAMUSCULAR

## 2024-02-23 MED ORDER — LIDOCAINE HCL (CARDIAC) PF 100 MG/5ML IV SOSY
PREFILLED_SYRINGE | INTRAVENOUS | Status: DC | PRN
  Administered 2024-02-23: 80 mg via INTRAVENOUS

## 2024-02-23 MED ORDER — BUPIVACAINE LIPOSOME 1.3 % IJ SUSP
20.0000 mL | Freq: Once | INTRAMUSCULAR | Status: DC
Start: 2024-02-23 — End: 2024-02-23

## 2024-02-23 MED ORDER — CHLORHEXIDINE GLUCONATE 0.12 % MT SOLN
OROMUCOSAL | Status: AC
  Filled 2024-02-23: qty 15

## 2024-02-23 MED ORDER — BUPIVACAINE LIPOSOME 1.3 % IJ SUSP
INTRAMUSCULAR | Status: AC
  Filled 2024-02-23: qty 20

## 2024-02-23 MED ORDER — PROPOFOL 10 MG/ML IV BOLUS
INTRAVENOUS | Status: DC | PRN
  Administered 2024-02-23: 20 mg via INTRAVENOUS
  Administered 2024-02-23: 100 mg via INTRAVENOUS
  Administered 2024-02-23: 20 mg via INTRAVENOUS

## 2024-02-23 MED ORDER — PHENYLEPHRINE 80 MCG/ML (10ML) SYRINGE FOR IV PUSH (FOR BLOOD PRESSURE SUPPORT)
PREFILLED_SYRINGE | INTRAVENOUS | Status: DC | PRN
  Administered 2024-02-23: 80 ug via INTRAVENOUS
  Administered 2024-02-23: 160 ug via INTRAVENOUS
  Administered 2024-02-23 (×2): 80 ug via INTRAVENOUS
  Administered 2024-02-23: 200 ug via INTRAVENOUS

## 2024-02-23 MED ORDER — FENTANYL CITRATE (PF) 100 MCG/2ML IJ SOLN
INTRAMUSCULAR | Status: AC
  Filled 2024-02-23: qty 2

## 2024-02-23 MED ORDER — PROPOFOL 1000 MG/100ML IV EMUL
INTRAVENOUS | Status: AC
  Filled 2024-02-23: qty 100

## 2024-02-23 MED ORDER — ACETAMINOPHEN 160 MG/5ML PO SOLN: 960.0000 mg | Freq: Four times a day (QID) | ORAL | Status: DC | PRN

## 2024-02-23 MED ORDER — ACETAMINOPHEN 160 MG/5ML PO SUSP
ORAL | Status: AC
  Filled 2024-02-23: qty 35

## 2024-02-23 MED ORDER — KETAMINE HCL 50 MG/5ML IJ SOSY
PREFILLED_SYRINGE | INTRAMUSCULAR | Status: DC | PRN
  Administered 2024-02-23: 10 mg via INTRAVENOUS
  Administered 2024-02-23: 20 mg via INTRAVENOUS

## 2024-02-23 MED ORDER — CHLORHEXIDINE GLUCONATE 0.12 % MT SOLN
15.0000 mL | Freq: Once | OROMUCOSAL | Status: AC
  Administered 2024-02-23: 15 mL via OROMUCOSAL

## 2024-02-23 MED ORDER — NUTREN 1.5 EN LIQD: ENTERAL | 3 refills | Status: DC

## 2024-02-23 MED ORDER — PHENYLEPHRINE HCL-NACL 20-0.9 MG/250ML-% IV SOLN
INTRAVENOUS | Status: DC | PRN
  Administered 2024-02-23: 40 ug/min via INTRAVENOUS

## 2024-02-23 MED ORDER — PHENYLEPHRINE 80 MCG/ML (10ML) SYRINGE FOR IV PUSH (FOR BLOOD PRESSURE SUPPORT)
PREFILLED_SYRINGE | INTRAVENOUS | Status: AC
  Filled 2024-02-23: qty 10

## 2024-02-23 MED ORDER — SODIUM CHLORIDE 0.9 % IV SOLN
2.0000 g | INTRAVENOUS | Status: AC
  Administered 2024-02-23: 2 g via INTRAVENOUS

## 2024-02-23 MED ORDER — IBUPROFEN 100 MG/5ML PO SUSP: 600.0000 mg | Freq: Three times a day (TID) | ORAL | 1 refills | Status: DC | PRN

## 2024-02-23 MED ORDER — OXYCODONE HCL 5 MG/5ML PO SOLN: 5.0000 mg | Freq: Once | ORAL | Status: DC | PRN

## 2024-02-23 MED ORDER — ACETAMINOPHEN 10 MG/ML IV SOLN
INTRAVENOUS | Status: AC
  Filled 2024-02-23: qty 100

## 2024-02-23 MED ORDER — FENTANYL CITRATE (PF) 100 MCG/2ML IJ SOLN: 25.0000 ug | INTRAMUSCULAR | Status: DC | PRN

## 2024-02-23 MED ORDER — PHENYLEPHRINE HCL-NACL 20-0.9 MG/250ML-% IV SOLN
INTRAVENOUS | Status: AC
  Filled 2024-02-23: qty 250

## 2024-02-23 MED ORDER — OXYCODONE HCL 5 MG/5ML PO SOLN: 5.0000 mg | ORAL | 0 refills | Status: DC | PRN

## 2024-02-23 MED ORDER — LIDOCAINE HCL (PF) 2 % IJ SOLN
INTRAMUSCULAR | Status: AC
  Filled 2024-02-23: qty 5

## 2024-02-23 MED ORDER — FENTANYL CITRATE (PF) 100 MCG/2ML IJ SOLN
INTRAMUSCULAR | Status: DC | PRN
  Administered 2024-02-23: 25 ug via INTRAVENOUS
  Administered 2024-02-23: 75 ug via INTRAVENOUS
  Administered 2024-02-23: 25 ug via INTRAVENOUS

## 2024-02-23 MED ORDER — BUPIVACAINE LIPOSOME 1.3 % IJ SUSP
INTRAMUSCULAR | Status: DC | PRN
  Administered 2024-02-23: 50 mL via INTRAMUSCULAR

## 2024-02-23 MED ORDER — KETAMINE HCL 50 MG/5ML IJ SOSY
PREFILLED_SYRINGE | INTRAMUSCULAR | Status: AC
  Filled 2024-02-23: qty 5

## 2024-02-23 MED ORDER — EPHEDRINE SULFATE-NACL 50-0.9 MG/10ML-% IV SOSY
PREFILLED_SYRINGE | INTRAVENOUS | Status: DC | PRN
  Administered 2024-02-23: 20 mg via INTRAVENOUS
  Administered 2024-02-23: 5 mg via INTRAVENOUS

## 2024-02-23 MED ORDER — ROCURONIUM BROMIDE 100 MG/10ML IV SOLN
INTRAVENOUS | Status: DC | PRN
  Administered 2024-02-23: 20 mg via INTRAVENOUS
  Administered 2024-02-23: 50 mg via INTRAVENOUS

## 2024-02-23 MED ORDER — SODIUM CHLORIDE 0.9 % IV SOLN: INTRAVENOUS | Status: DC

## 2024-02-23 MED ORDER — SODIUM CHLORIDE 0.9 % IV SOLN
INTRAVENOUS | Status: AC
  Filled 2024-02-23: qty 2

## 2024-02-23 MED ORDER — SUGAMMADEX SODIUM 500 MG/5ML IV SOLN
INTRAVENOUS | Status: DC | PRN
  Administered 2024-02-23: 100 mg via INTRAVENOUS

## 2024-02-23 MED ORDER — ONDANSETRON HCL 4 MG/2ML IJ SOLN
INTRAMUSCULAR | Status: DC | PRN
  Administered 2024-02-23: 4 mg via INTRAVENOUS

## 2024-02-23 MED ORDER — ACETAMINOPHEN 160 MG/5ML PO SOLN
1000.0000 mg | Freq: Once | ORAL | Status: AC
  Administered 2024-02-23: 1000 mg via ORAL
  Filled 2024-02-23: qty 40.6

## 2024-02-23 MED ORDER — LIDOCAINE HCL (PF) 1 % IJ SOLN
INTRAMUSCULAR | Status: AC
  Filled 2024-02-23: qty 30

## 2024-02-23 MED ORDER — HEPARIN SODIUM (PORCINE) 5000 UNIT/ML IJ SOLN
INTRAMUSCULAR | Status: AC
  Filled 2024-02-23: qty 1

## 2024-02-23 MED ORDER — GABAPENTIN 250 MG/5ML PO SOLN
200.0000 mg | Freq: Once | ORAL | Status: AC
Start: 2024-02-23 — End: 2024-02-23
  Administered 2024-02-23: 200 mg via ORAL
  Filled 2024-02-23: qty 4

## 2024-02-23 MED ORDER — STERILE WATER FOR IRRIGATION IR SOLN
Status: DC | PRN
  Administered 2024-02-23: 100 mL

## 2024-02-23 MED ORDER — DEXAMETHASONE SODIUM PHOSPHATE 10 MG/ML IJ SOLN
INTRAMUSCULAR | Status: DC | PRN
  Administered 2024-02-23: 5 mg via INTRAVENOUS

## 2024-02-23 MED ORDER — EPHEDRINE 5 MG/ML INJ
INTRAVENOUS | Status: AC
  Filled 2024-02-23: qty 5

## 2024-02-23 MED ORDER — BUPIVACAINE HCL (PF) 0.5 % IJ SOLN
INTRAMUSCULAR | Status: AC
  Filled 2024-02-23: qty 30

## 2024-02-23 MED ORDER — OXYCODONE HCL 5 MG PO TABS: 5.0000 mg | ORAL_TABLET | Freq: Once | ORAL | Status: DC | PRN

## 2024-02-23 MED ORDER — CHLORHEXIDINE GLUCONATE CLOTH 2 % EX PADS
6.0000 | MEDICATED_PAD | Freq: Once | CUTANEOUS | Status: DC
Start: 2024-02-23 — End: 2024-02-23

## 2024-02-23 MED ORDER — LACTATED RINGERS IV SOLN: INTRAVENOUS | Status: DC | PRN

## 2024-02-23 MED ORDER — ROCURONIUM BROMIDE 10 MG/ML (PF) SYRINGE
PREFILLED_SYRINGE | INTRAVENOUS | Status: AC
  Filled 2024-02-23: qty 10

## 2024-02-23 MED ORDER — CHLORHEXIDINE GLUCONATE CLOTH 2 % EX PADS
6.0000 | MEDICATED_PAD | Freq: Once | CUTANEOUS | Status: AC
Start: 2024-02-23 — End: 2024-02-23
  Administered 2024-02-23: 6 via TOPICAL

## 2024-02-23 MED ORDER — ORAL CARE MOUTH RINSE: 15.0000 mL | Freq: Once | OROMUCOSAL | Status: AC

## 2024-02-23 SURGICAL SUPPLY — 40 items
BAG DECANTER FOR FLEXI CONT (MISCELLANEOUS) ×2 IMPLANT
BLADE SURG SZ11 CARB STEEL (BLADE) ×2 IMPLANT
CHLORAPREP W/TINT 26 (MISCELLANEOUS) IMPLANT
DERMABOND ADVANCED .7 DNX12 (GAUZE/BANDAGES/DRESSINGS) ×2 IMPLANT
DRAPE C-ARM XRAY 36X54 (DRAPES) ×2 IMPLANT
DRAPE LAPAROTOMY TRNSV 106X77 (MISCELLANEOUS) IMPLANT
ELECTRODE CAUTERY BLDE TIP 2.5 (TIP) ×2 IMPLANT
ELECTRODE REM PT RTRN 9FT ADLT (ELECTROSURGICAL) ×2 IMPLANT
G-TUBE MIC 18FR ENFIT ADLT (TUBING) IMPLANT
GLOVE SURG SYN 7.0 (GLOVE) ×2 IMPLANT
GLOVE SURG SYN 7.0 PF PI (GLOVE) ×2 IMPLANT
GLOVE SURG SYN 7.5 E (GLOVE) ×2 IMPLANT
GLOVE SURG SYN 7.5 PF PI (GLOVE) ×2 IMPLANT
GOWN STRL REUS W/ TWL LRG LVL3 (GOWN DISPOSABLE) ×4 IMPLANT
IV NS 500ML BAXH (IV SOLUTION) ×2 IMPLANT
KIT PORT INFUSION SMART 8FR (Port) ×2 IMPLANT
KIT TURNOVER KIT A (KITS) ×2 IMPLANT
LABEL OR SOLS (LABEL) ×2 IMPLANT
MANIFOLD NEPTUNE II (INSTRUMENTS) ×2 IMPLANT
NDL FILTER BLUNT 18X1 1/2 (NEEDLE) ×2 IMPLANT
NDL HYPO 22X1.5 SAFETY MO (MISCELLANEOUS) ×2 IMPLANT
NEEDLE FILTER BLUNT 18X1 1/2 (NEEDLE) ×1 IMPLANT
NEEDLE HYPO 22X1.5 SAFETY MO (MISCELLANEOUS) ×1 IMPLANT
PACK PORT-A-CATH (MISCELLANEOUS) ×2 IMPLANT
SPIKE FLUID TRANSFER (MISCELLANEOUS) ×6 IMPLANT
SPONGE DRAIN TRACH 4X4 STRL 2S (GAUZE/BANDAGES/DRESSINGS) IMPLANT
SPONGE T-LAP 18X18 ~~LOC~~+RFID (SPONGE) IMPLANT
SUT MNCRL AB 4-0 PS2 18 (SUTURE) ×2 IMPLANT
SUT PDS AB 1 CT1 36 (SUTURE) IMPLANT
SUT PROLENE 2 0 SH DA (SUTURE) IMPLANT
SUT PROLENE 3 0 SH DA (SUTURE) ×2 IMPLANT
SUT SILK 2 0 SH CR/8 (SUTURE) IMPLANT
SUT VIC AB 3-0 SH 27X BRD (SUTURE) ×2 IMPLANT
SUT VIC AB 4-0 SH 27XANBCTRL (SUTURE) IMPLANT
SYR 10ML LL (SYRINGE) ×2 IMPLANT
SYR 3ML LL SCALE MARK (SYRINGE) ×2 IMPLANT
SYRINGE TOOMEY IRRIG 70ML (MISCELLANEOUS) IMPLANT
TRAP FLUID SMOKE EVACUATOR (MISCELLANEOUS) ×2 IMPLANT
TUBE GASTRO 18FR ENFIT (TUBING) ×1 IMPLANT
WATER STERILE IRR 500ML POUR (IV SOLUTION) ×2 IMPLANT

## 2024-02-23 NOTE — Progress Notes (Signed)
 Phone call made to patient to check in and see if they had any questions at this point. Pt's wife stated that pt is doing well and resting comfortably after feeding tube/port placement this morning. Reminded of appt with nutritionist that is scheduled for tomorrow at 12pm. Informed that will touch base again tomorrow and further review upcoming appts. Instructed to call back if has any further questions today. Mason Stewart verbalized understanding.

## 2024-02-23 NOTE — Anesthesia Preprocedure Evaluation (Addendum)
 Anesthesia Evaluation  Patient identified by MRN, date of birth, ID band Patient awake    Reviewed: Allergy & Precautions, NPO status , Patient's Chart, lab work & pertinent test results  History of Anesthesia Complications Negative for: history of anesthetic complications  Airway Mallampati: III  TM Distance: >3 FB Neck ROM: full    Dental no notable dental hx.    Pulmonary sleep apnea    Pulmonary exam normal        Cardiovascular hypertension, On Medications Normal cardiovascular exam     Neuro/Psych  Headaches  Neuromuscular disease  negative psych ROS   GI/Hepatic Neg liver ROS,GERD  ,,  Endo/Other  diabetes, Type 2    Renal/GU      Musculoskeletal   Abdominal   Peds  Hematology negative hematology ROS (+)   Anesthesia Other Findings Past Medical History: No date: Cancer [facility])     Comment:  prostate cancer No date: Diabetes mellitus without complication (HCC) No date: GERD (gastroesophageal reflux disease) No date: Glaucoma (increased eye pressure) No date: Headache 12/12/2013: History of prostate cancer No date: Hypertension No date: Melanoma (HCC) 12/12/2013: Melanoma of thoracic region [facility]) 12/12/2013: 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 Cornett, MD;  Location: [facility];  Service: General;  Laterality: Right; 10/14/1995: BACK SURGERY     Comment:  lower back 02/16/2024: BRONCHOSCOPY, WITH BIOPSY USING ELECTROMAGNETIC NAVIGATION;  Bilateral     Comment:  Procedure: ROBOTIC ASSISTED NAVIGATIONAL BRONCHOSCOPY;                Surgeon:  Vergia Glasgow, MD;  Location: [facility];                Service: Pulmonary;  Laterality: Bilateral; 03/14/2023: CARPAL TUNNEL RELEASE; Right 10/13/2010: colonscopy 11/23/2013: EXCISION MELANOMA WITH SENTINEL LYMPH NODE BIOPSY; Right     Comment:  Procedure: EXCISION MELANOMA WITH SENTINEL LYMPH NODE               BIOPSY;  Surgeon: Brandy Cal. Cornett, MD;  Location: [facility];  Service: General;  Laterality:               Right; No date: EYE SURGERY     Comment:  cataract left, reduced pressure in left yrs ago: left middle finger surgery after injury 06/07/2012: ROBOT ASSISTED LAPAROSCOPIC RADICAL PROSTATECTOMY     Comment:  Procedure: ROBOTIC ASSISTED LAPAROSCOPIC RADICAL               PROSTATECTOMY LEVEL 2;  Surgeon: Kristeen Peto, MD;                Location: [facility];  Service: Urology;  Laterality: N/A;                       Reproductive/Obstetrics negative OB ROS  Anesthesia Physical Anesthesia Plan  ASA: 3  Anesthesia Plan: General ETT   Post-op Pain Management: Ofirmev  IV (intra-op)*, Toradol  IV (intra-op)* and Gabapentin PO (pre-op)*   Induction: Intravenous  PONV Risk Score and Plan: 2 and Ondansetron , Dexamethasone  and Treatment may vary due to age or medical condition  Airway Management Planned: Oral ETT  Additional Equipment:   Intra-op Plan:   Post-operative Plan: Extubation in OR  Informed Consent: I have reviewed the patients History and Physical, chart, labs and discussed the procedure including the risks, benefits and alternatives for the proposed anesthesia with the patient or authorized representative who has indicated his/her understanding and acceptance.     Dental Advisory Given  Plan Discussed with: Anesthesiologist, CRNA and Surgeon  Anesthesia Plan Comments: (Patient consented for risks of anesthesia including but not limited to:  - adverse reactions to  medications - damage to eyes, teeth, lips or other oral mucosa - nerve damage due to positioning  - sore throat or hoarseness - Damage to heart, brain, nerves, lungs, other parts of body or loss of life  Patient voiced understanding and assent.)       Anesthesia Quick Evaluation

## 2024-02-23 NOTE — Interval H&P Note (Signed)
 History and Physical Interval Note:  02/23/2024 7:08 AM  Mason Stewart  has presented today for surgery, with the diagnosis of Metastatic melanoma.  The various methods of treatment have been discussed with the patient and family. After consideration of risks, benefits and other options for treatment, the patient has consented to  Procedure(s): INSERTION, TUNNELED CENTRAL VENOUS DEVICE, WITH PORT (N/A) CREATION, GASTROSTOMY, OPEN (N/A) as a surgical intervention.  The patient's history has been reviewed, patient examined, no change in status, stable for surgery.  I have reviewed the patient's chart and labs.  Questions were answered to the patient's satisfaction.     Lamira Borin

## 2024-02-23 NOTE — Progress Notes (Signed)
 Pharmacist Chemotherapy Monitoring - Initial Assessment    Anticipated start date: 02/26/24  The following has been reviewed per standard work regarding the patient's treatment regimen: The patient's diagnosis, treatment plan and drug doses, and organ/hematologic function Lab orders and baseline tests specific to treatment regimen  The treatment plan start date, drug sequencing, and pre-medications Prior authorization status  Patient's documented medication list, including drug-drug interaction screen and prescriptions for anti-emetics and supportive care specific to the treatment regimen The drug concentrations, fluid compatibility, administration routes, and timing of the medications to be used The patient's access for treatment and lifetime cumulative dose history, if applicable  The patient's medication allergies and previous infusion related reactions, if applicable   Given the presence of leptomeningeal disease proceed with combination immunotherapy with ipilimumab at 1 mg/kg along with nivolumab 3 mg/kg IV every 3 weeks for 4 treatments followed by maintenance nivolumab until progression or toxicity.  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      Emaline Handsome, RPH, 02/23/2024  2:39 PM

## 2024-02-23 NOTE — Anesthesia Postprocedure Evaluation (Signed)
 Anesthesia Post Note  Patient: Mason Stewart  Procedure(s) Performed: INSERTION, TUNNELED CENTRAL VENOUS DEVICE, WITH PORT CREATION, GASTROSTOMY, OPEN  Patient location during evaluation: PACU Anesthesia Type: General Level of consciousness: awake and alert Pain management: pain level controlled Vital Signs Assessment: post-procedure vital signs reviewed and stable Respiratory status: spontaneous breathing, nonlabored ventilation, respiratory function stable and patient connected to nasal cannula oxygen Cardiovascular status: blood pressure returned to baseline and stable Postop Assessment: no apparent nausea or vomiting Anesthetic complications: no   No notable events documented.   Last Vitals:  Vitals:   02/23/24 1130 02/23/24 1154  BP: (!) 156/80 (!) 169/80  Pulse: 85 89  Resp: 16 18  Temp:  (!) 36.1 C  SpO2: 95% 97%    Last Pain:  Vitals:   02/23/24 1154  TempSrc: Temporal  PainSc: 3                  Nancey Awkward

## 2024-02-23 NOTE — Op Note (Signed)
 Procedure Date:  02/23/2024  Pre-operative Diagnosis:  Metastatic melanoma  Post-operative Diagnosis: Metastatic melanoma  Procedure:   Left subclavian port-a-cath placement Open gastrostomy tube placement  Surgeon:  Marene Shape, MD  Anesthesia:  General endotracheal  Estimated Blood Loss:  20 ml  Specimens:  None  Complications:  None  Indications for Procedure:  This is a 73 y.o. male with metastatic melanoma and significant recent weight loss who requires a port-a-cath placement and gastrostomy tube placement.  The risks of bleeding, infection, injury to surrounding structures, thrombosis, nonfunction, pneumothorax, hemothorax, and need for further procedures were discussed with the patient and was willing to proceed.  Description of Procedure: The patient was correctly identified in the preoperative area and brought into the operating room.  The patient was placed supine with VTE prophylaxis in place.  Appropriate time-outs were performed.  Anesthesia was induced and the patient was intubated.  Appropriate antibiotics were infused.  The left chest and neck were prepped and draped in usual sterile fashion. The patient was placed in Trendelenburg position and local anesthetic was infiltrated into the skin and subcutaneous tissues in the anterior chest wall. Using ultrasound guidance, the large bore needle was placed into the subclavian vein without difficulty and then the Seldinger wire was advanced. Fluoroscopy was utilized to confirm that the Seldinger wire was in the superior vena cava.  The introducer dilator was placed over the Seldinger wire and the wire was removed. The previously flushed catheter was placed into the introducer dilator and the peel-away sheath was removed.  Fluoroscopy was used to confirm catheter location in the superior vena cava.  An incision was made and a port pocket developed with blunt and electrocautery dissection. The catheter was then tunneled  subcutaneously to the port pocket.  The catheter was then cut to appropriate length and attached to the previously flushed port. The port was placed into the pocket. Fluoroscopy again confirmed appropriate location of the catheter in the superior vena cava and revealed no kinking of the catheter at the insertion site.  The port was secured in place with 3-0 Prolenes and flushed for function and heparin  locked.  The wound was closed with interrupted 3-0 Vicryl followed by 4-0 subcuticular Monocryl sutures and sealed with DermaBond.  All drapes were removed and then the abdomen was prepped and draped in usual sterile fashion.  A 5 cm epigastric incision was made, and cautery was used to dissect along the subcutaneous tissue to the fascia.  The fascia was incised and elevated and we accessed the peritoneal cavity.  The incision was extended superiorly and inferiorly.  Babcocks were used to elevate the antrum of the stomach to the anterior abdominal wall.  Two concentric purse string sutures with 2-0 Silk were placed at the planned site for our feeding tube.  Then, an incision was made through the abdominal wall and a 16 Fr. Gastrostomy tube was passed through.  A gastrotomy was created in the middle of the purse strings and the tube inserted into the stomach.  The sutures were tied securing the tube in place.  2-0 Silk sutures were used to tack the stomach wall to the anterior abdominal wall in 4 locations -- superiorly, laterally, medially, and inferiorly.  The gastrostomy tube balloon was inflated with 9 ml of NS and cinched up.  The silicone disc bumper was cinched down.    Local anesthetic was injected onto the peritoneum, fascia, and subcutaneous tissue.  The fascia was closed with #1 PDS suture,  followed by 3-0 Vicryl and 4-0 Monocryl for the skin.  The tube was secured to the skin using 2-0 Prolene.  The tube was flushed through both ports confirming patency of both ports.  The tube was dressed with drain  sponge and secured with tape.  The patient was emerged from anesthesia and extubated and brought to the recovery room for further management.  A chest x-ray was ordered.  The patient tolerated the procedure well and all counts were correct at the end of the case.   Marene Shape, MD

## 2024-02-23 NOTE — Anesthesia Procedure Notes (Signed)
 Procedure Name: Intubation Date/Time: 02/23/2024 7:40 AM  Performed by: Wilkins Hardy I, CRNAPre-anesthesia Checklist: Patient identified, Patient being monitored, Timeout performed, Emergency Drugs available and Suction available Patient Re-evaluated:Patient Re-evaluated prior to induction Oxygen Delivery Method: Circle system utilized Preoxygenation: Pre-oxygenation with 100% oxygen Induction Type: IV induction Ventilation: Mask ventilation without difficulty Laryngoscope Size: McGrath and 4 Grade View: Grade I Tube type: Oral Tube size: 7.5 mm Number of attempts: 1 Airway Equipment and Method: Stylet Placement Confirmation: ETT inserted through vocal cords under direct vision, positive ETCO2 and breath sounds checked- equal and bilateral Secured at: 21 cm Tube secured with: Tape Dental Injury: Teeth and Oropharynx as per pre-operative assessment

## 2024-02-23 NOTE — Transfer of Care (Signed)
 Immediate Anesthesia Transfer of Care Note  Patient: Mason Stewart  Procedure(s) Performed: INSERTION, TUNNELED CENTRAL VENOUS DEVICE, WITH PORT CREATION, GASTROSTOMY, OPEN  Patient Location: PACU  Anesthesia Type:General  Level of Consciousness: responds to stimulation  Airway & Oxygen Therapy: Patient Spontanous Breathing and Patient connected to face mask oxygen  Post-op Assessment: Report given to RN and Post -op Vital signs reviewed and stable  Post vital signs: stable  Last Vitals:  Vitals Value Taken Time  BP 151/77 02/23/24 1030  Temp    Pulse 83 02/23/24 1033  Resp 13 02/23/24 1033  SpO2 100 % 02/23/24 1033  Vitals shown include unfiled device data.  Last Pain:  Vitals:   02/23/24 0617  TempSrc: Oral         Complications: No notable events documented.

## 2024-02-23 NOTE — Discharge Instructions (Signed)
 Discharge Instructions: 1.  Patient may shower, but do not scrub wounds heavily and dab dry only. 2.  Do not submerge wounds in pool/tub until fully healed. 3.  Do not apply ointments or hydrogen peroxide to the wounds. 4.  May apply ice packs to the wounds for comfort. 5.  Please change dressing around feeding tube with 4x4 gauze or drain gauze once daily.  Secure with tape. 6.  Please flush feeding tube per Nutrition team instructions. 7.  Do not drive while taking narcotics for pain control.  Prior to driving, make sure you are able to rotate right and left to look at blindspots without significant pain or discomfort. 8.  No heavy lifting or pushing of more than 10-15 lbs for 4 weeks.

## 2024-02-24 ENCOUNTER — Other Ambulatory Visit: Payer: Self-pay

## 2024-02-24 ENCOUNTER — Inpatient Hospital Stay

## 2024-02-24 ENCOUNTER — Encounter: Payer: Self-pay | Admitting: Surgery

## 2024-02-24 DIAGNOSIS — C439 Malignant melanoma of skin, unspecified: Secondary | ICD-10-CM

## 2024-02-24 NOTE — Progress Notes (Addendum)
 Nutrition Follow-up:  Patient with stage IV melanoma with leptomeningeal spread.  16 Fr Gastrostomy tube placed on 5/13.  Planning to start immunotherapy on 5/16  Met with patient and wife in clinic. Patient has placed water  and little bit of apple juice through feeding tube without difficulty.  Last bowel movement was 2 days ago    Medications: reviewed  Labs: reviewed  Anthropometrics:   No new weight   Estimated Energy Needs  Kcals: 1975-2300 Protein: 94-106 g Fluid: 1975-2300 ml  NUTRITION DIAGNOSIS: Inadequate oral intake continues   INTERVENTION:  Wife demonstrated how to give water  flush in feeding tube during visit today.  Discussed slow titration of tube feeding with patient and wife due to refeeding syndrome. Goal rate is 6 cartons per day. Patient will give 2 cartons today and tomorrow via tube (1/2 carton at 4 feedings) Patient will add 1 carton q 2 days.  Flush with 60 cc of water  before and 120 cc water  after each feeding (goal 1 carton 6 times a day).   Written instructions provided to wife and patient.  Recommend starting thiamine 100mg  for 7 days (crushed via tube) Continue MVI daily Refeeding labs will be checked on 5/16.  Message sent to MD.     MONITORING, EVALUATION, GOAL: weight trends, tube feeding tolerance   NEXT VISIT: Tomorrow via phone (5/15)  Mason Stewart, CSO, LDN Registered Dietitian [phone]

## 2024-02-25 ENCOUNTER — Inpatient Hospital Stay

## 2024-02-25 ENCOUNTER — Ambulatory Visit
Admission: RE | Admit: 2024-02-25 | Discharge: 2024-02-25 | Disposition: A | Discharge status: Home / Self Care | Source: Ambulatory Visit | Attending: Oncology | Admitting: Oncology

## 2024-02-25 ENCOUNTER — Encounter

## 2024-02-25 DIAGNOSIS — I69991 Dysphagia following unspecified cerebrovascular disease: Secondary | ICD-10-CM | POA: Insufficient documentation

## 2024-02-25 DIAGNOSIS — C439 Malignant melanoma of skin, unspecified: Secondary | ICD-10-CM

## 2024-02-25 DIAGNOSIS — R1310 Dysphagia, unspecified: Secondary | ICD-10-CM | POA: Insufficient documentation

## 2024-02-25 NOTE — Progress Notes (Addendum)
 Nutrition Follow-up:   Spoke with wife for nutrition follow-up.  Yesterday gave patient 1/4 cup of water , 1/4 cup formula and 1/4 cup water  around 3:30pm.  At 5:45pm gave same amount again as patient reports feeling full.  Gave same dose at 9pm.  This am son-in-law gave patient 1/4 cup water  before and after and 1/4 cup formula at 11am and then at 11:30am gave more water  and 1/4 cup formula with MVI.  Picking up thiamine this afternoon.  Patient reports feeling full.  Wife thinks it may have been a few days since last bowel movement. Patient had swallow study this afternoon and tired when he got home.  Will try more feedings this afternoon/evening.   Wife received call from [employer] and formula will be shipped out on Monday, 5/19.  Patient has samples of osmolite 1.5 (equivalent to Nutren 1.5) to last until receives shipment.    Intervention: Wife will continue to provide small amounts of water  and formula as tolerated.  With that small volume can increase frequency of feedings if patient able to tolerate.  Wife will pay attention to bowel movement.   If still unable to tolerate volume may need trial of reglan.    Next follow-up: Tuesday, May 20th via phone

## 2024-02-25 NOTE — Progress Notes (Signed)
 [facility] CSW Progress Note  Clinical Social Work introduced self to patient during Patient Education with Octavio Ben, Charity fundraiser.  Provided information regarding CSW role, including counseling, advanced care planning and support group.  Answered questions as needed.  Kennth Peal, LCSW Clinical Social Worker [facility]

## 2024-02-25 NOTE — Therapy (Signed)
 Modified Barium Swallow Study  Patient Details  Name: Mason Stewart MRN: [id number] Date of Birth: Sep 29, 1951  Today's Date: 02/25/2024  Modified Barium Swallow completed.  Full report located under Chart Review in the Imaging Section.  History of Present Illness Pt is a 73 y.o. male who presents for MBSS. Pt with Stage IV melanoma with leptomeningeal spread. Per Palliative Care note, 02/19/24, "Over the past 3 weeks, he has had significant progression of weakness with slurred speech and dysphagia. He is coughing when he attempts to eat or drink anything of substance. Thickness of liquids does not seem to help. However, patient can eat some soft foods. He has had significant weight loss recently." PEG placed 02/23/24. Pt with PMHx prostate cancer, GERD, DMII, and sleep apnea. MRI 02/09/24 "1. Abnormal nodular thickening and enhancement about multiple cranial nerves at the skull base and pituitary stalk, with additional subtle leptomeningeal enhancement about the cerebellum. 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 2. Mild associated edema within the brain parenchyma of the pons/left middle cerebellar peduncle..." Pt reports no longer eating because it's "too difficult."   Clinical Impression Pt presents with a profound oropharyngeal dysphagia. Oral deficits included, but not limited to, anterior loss of liquids, prolonged/repetitive lingual movement during attempts at A-P transit, pre-swallow spillage to pharyx, and oral stasis which was most pronounced with puree trial. Oral deficits due to orofacial weakness, changes to lingual sensation, reduced posterior lingual control. Pharyngeally, pt with an inconsistent ability to trigger a swallow. When triggered, swallow initiated at the level of the pyriform sinus after pre-swallow pooling. Significant pharyngeal stasis was most appreciated with puree with limited-to-no pharyngeal clearance  despite cued secondary and effortful swallows. Additionally, pt with pre-swallow aspiration of thin liquids with continued penetration/aspiration post-swallow. Pharyngeal deficits due to decrease base of tongue retraction, reduced hyolaryngeal elevation and excursion, and reduced amplitude and duration of UES opening.  At present, a safe oral diet cannot be recommended. Recommend NPO x single sips of H2O and single ice chips for comfort as pt desires. Oral care prior to any ice chips or sips of H2O. Use of PEG for nutrition/hydration/medication. Factors that may increase risk of adverse event in presence of aspiration Roderick Civatte & Jessy Morocco 2021): Frail or deconditioned;Respiratory or GI disease;Poor general health and/or compromised immunity;Presence of tubes (ETT, trach, NG, etc.)  Swallowing Tx: Pt educated re: role of SLP, MBSS procedure, results of assessment, recommendation to consider SLP services if in alignment with GOC, current SLP recommendations, rationale for continued POs (ice chips, single sips of water ). Pt shown videofluoroscopic images for further education. Pt asked pertinent questions and verbalized satisfaction with education provided by SLP.    Swallow Evaluation Recommendations Recommendations: NPO;Alternative means of nutrition - G Tube;Free water  protocol after oral care;Ice chips PRN after oral care (ice chips and single sips of water  for comfort as pt wishes) Medication Administration: Via alternative means Oral care recommendations: Oral care BID (2x/day);Oral care before ice chips/water ;Pt independent with oral care Recommended consults: Consider Palliative care; Registered Dietician (Palliative Care and RD following)    Dia Forget, M.S., CCC-SLP Speech-Language Pathologist [facility] [phone] [place])   Adin Honour 02/25/2024,2:02 PM

## 2024-02-26 ENCOUNTER — Encounter: Payer: Self-pay | Admitting: Oncology

## 2024-02-26 ENCOUNTER — Inpatient Hospital Stay

## 2024-02-26 ENCOUNTER — Encounter: Payer: Self-pay | Admitting: *Deleted

## 2024-02-26 ENCOUNTER — Other Ambulatory Visit: Payer: Self-pay | Admitting: Radiation Therapy

## 2024-02-26 ENCOUNTER — Other Ambulatory Visit: Payer: Self-pay

## 2024-02-26 VITALS — BP 142/80 | HR 80 | Temp 98.4°F | Resp 16

## 2024-02-26 DIAGNOSIS — R1310 Dysphagia, unspecified: Secondary | ICD-10-CM

## 2024-02-26 DIAGNOSIS — C439 Malignant melanoma of skin, unspecified: Secondary | ICD-10-CM

## 2024-02-26 DIAGNOSIS — Z5112 Encounter for antineoplastic immunotherapy: Secondary | ICD-10-CM | POA: Diagnosis not present

## 2024-02-26 LAB — CMP (CANCER CENTER ONLY)
ALT: 14 U/L (ref 0–44)
AST: 19 U/L (ref 15–41)
Albumin: 3.5 g/dL (ref 3.5–5.0)
Alkaline Phosphatase: 77 U/L (ref 38–126)
Anion gap: 9 (ref 5–15)
BUN: 14 mg/dL (ref 8–23)
CO2: 27 mmol/L (ref 22–32)
Calcium: 8.8 mg/dL — ABNORMAL LOW (ref 8.9–10.3)
Chloride: 95 mmol/L — ABNORMAL LOW (ref 98–111)
Creatinine: 0.7 mg/dL (ref 0.61–1.24)
GFR, Estimated: >60 mL/min (ref >60)
Glucose, Bld: 148 mg/dL — ABNORMAL HIGH (ref 70–99)
Potassium: 3.4 mmol/L — ABNORMAL LOW (ref 3.5–5.1)
Sodium: 131 mmol/L — ABNORMAL LOW (ref 135–145)
Total Bilirubin: 1.1 mg/dL (ref 0.0–1.2)
Total Protein: 7.1 g/dL (ref 6.5–8.1)

## 2024-02-26 LAB — CBC WITH DIFFERENTIAL (CANCER CENTER ONLY)
Abs Immature Granulocytes: 0.12 10*3/uL — ABNORMAL HIGH (ref 0.00–0.07)
Basophils Absolute: 0.1 10*3/uL (ref 0.0–0.1)
Basophils Relative: 0 %
Eosinophils Absolute: 0.2 10*3/uL (ref 0.0–0.5)
Eosinophils Relative: 2 %
HCT: 34.4 % — ABNORMAL LOW (ref 39.0–52.0)
Hemoglobin: 12.5 g/dL — ABNORMAL LOW (ref 13.0–17.0)
Immature Granulocytes: 1 %
Lymphocytes Relative: 8 %
Lymphs Abs: 1 10*3/uL (ref 0.7–4.0)
MCH: 30.9 pg (ref 26.0–34.0)
MCHC: 36.3 g/dL — ABNORMAL HIGH (ref 30.0–36.0)
MCV: 84.9 fL (ref 80.0–100.0)
Monocytes Absolute: 1 10*3/uL (ref 0.1–1.0)
Monocytes Relative: 7 %
Neutro Abs: 10.8 10*3/uL — ABNORMAL HIGH (ref 1.7–7.7)
Neutrophils Relative %: 82 %
Platelet Count: 296 10*3/uL (ref 150–400)
RBC: 4.05 MIL/uL — ABNORMAL LOW (ref 4.22–5.81)
RDW: 12.7 % (ref 11.5–15.5)
WBC Count: 13.1 10*3/uL — ABNORMAL HIGH (ref 4.0–10.5)
nRBC: 0 % (ref 0.0–0.2)

## 2024-02-26 LAB — TSH: TSH: 0.114 u[IU]/mL — ABNORMAL LOW (ref 0.350–4.500)

## 2024-02-26 LAB — T4, FREE: Free T4: 1.56 ng/dL — ABNORMAL HIGH (ref 0.61–1.12)

## 2024-02-26 LAB — LACTATE DEHYDROGENASE: LDH: 123 U/L (ref 98–192)

## 2024-02-26 LAB — PHOSPHORUS: Phosphorus: 3 mg/dL (ref 2.5–4.6)

## 2024-02-26 LAB — MAGNESIUM: Magnesium: 2.1 mg/dL (ref 1.7–2.4)

## 2024-02-26 MED ORDER — SODIUM CHLORIDE 0.9 % IV SOLN
1.0000 mg/kg | Freq: Once | INTRAVENOUS | Status: AC
  Administered 2024-02-26: 80 mg via INTRAVENOUS
  Filled 2024-02-26: qty 16

## 2024-02-26 MED ORDER — HEPARIN SOD (PORK) LOCK FLUSH 100 UNIT/ML IV SOLN
500.0000 [IU] | Freq: Once | INTRAVENOUS | Status: AC | PRN
  Administered 2024-02-26: 500 [IU]
  Filled 2024-02-26: qty 5

## 2024-02-26 MED ORDER — SODIUM CHLORIDE 0.9 % IV SOLN
INTRAVENOUS | Status: DC
  Filled 2024-02-26: qty 250

## 2024-02-26 MED ORDER — SODIUM CHLORIDE 0.9 % IV SOLN
3.0100 mg/kg | Freq: Once | INTRAVENOUS | Status: AC
  Administered 2024-02-26: 240 mg via INTRAVENOUS
  Filled 2024-02-26: qty 24

## 2024-02-26 MED ORDER — DIPHENHYDRAMINE HCL 50 MG/ML IJ SOLN
25.0000 mg | Freq: Once | INTRAMUSCULAR | Status: AC
  Administered 2024-02-26: 25 mg via INTRAVENOUS
  Filled 2024-02-26: qty 1

## 2024-02-26 MED ORDER — FAMOTIDINE IN NACL 20-0.9 MG/50ML-% IV SOLN
20.0000 mg | Freq: Once | INTRAVENOUS | Status: AC
  Administered 2024-02-26: 20 mg via INTRAVENOUS
  Filled 2024-02-26: qty 50

## 2024-02-26 NOTE — Patient Instructions (Signed)
 [facility] - A DEPT OF [facility]  Discharge Instructions: Thank you for choosing [facility] to provide your oncology and hematology care.  If you have a lab appointment with the Cancer Center, please go directly to the Cancer Center and check in at the registration area.  Wear comfortable clothing and clothing appropriate for easy access to any Portacath or PICC line.   We strive to give you quality time with your provider. You may need to reschedule your appointment if you arrive late (15 or more minutes).  Arriving late affects you and other patients whose appointments are after yours.  Also, if you miss three or more appointments without notifying the office, you may be dismissed from the clinic at the provider's discretion.      For prescription refill requests, have your pharmacy contact our office and allow 72 hours for refills to be completed.    Today you received the following chemotherapy and/or immunotherapy agents opdivo  and yervoy       To help prevent nausea and vomiting after your treatment, we encourage you to take your nausea medication as directed.  BELOW ARE SYMPTOMS THAT SHOULD BE REPORTED IMMEDIATELY: *FEVER GREATER THAN 100.4 F (38 C) OR HIGHER *CHILLS OR SWEATING *NAUSEA AND VOMITING THAT IS NOT CONTROLLED WITH YOUR NAUSEA MEDICATION *UNUSUAL SHORTNESS OF BREATH *UNUSUAL BRUISING OR BLEEDING *URINARY PROBLEMS (pain or burning when urinating, or frequent urination) *BOWEL PROBLEMS (unusual diarrhea, constipation, pain near the anus) TENDERNESS IN MOUTH AND THROAT WITH OR WITHOUT PRESENCE OF ULCERS (sore throat, sores in mouth, or a toothache) UNUSUAL RASH, SWELLING OR PAIN  UNUSUAL VAGINAL DISCHARGE OR ITCHING   Items with * indicate a potential emergency and should be followed up as soon as possible or go to the Emergency Department if any problems should occur.  Please show the CHEMOTHERAPY ALERT CARD or  IMMUNOTHERAPY ALERT CARD at check-in to the Emergency Department and triage nurse.  Should you have questions after your visit or need to cancel or reschedule your appointment, please contact [facility] - A DEPT OF [facility]  [phone] and follow the prompts.  Office hours are 8:00 a.m. to 4:30 p.m. Monday - Friday. Please note that voicemails left after 4:00 p.m. may not be returned until the following business day.  We are closed weekends and major holidays. You have access to a nurse at all times for urgent questions. Please call the main number to the clinic [phone] and follow the prompts.  For any non-urgent questions, you may also contact your provider using MyChart. We now offer e-Visits for anyone 25 and older to request care online for non-urgent symptoms. For details visit [email or web address].   Also download the MyChart app! Go to the app store, search "MyChart", open the app, select [place], and log in with your MyChart username and password.

## 2024-02-26 NOTE — Progress Notes (Signed)
 Ipilimumab (YERVOY) Patient Monitoring Assessment   Is the patient experiencing any of the following general symptoms?:  [yes ]Difficulty performing normal activities [yes ]Feeling sluggish or cold all the time [no ]Unusual weight gain [ sometimes]Constant or unusual headaches [sometimes ]Feeling dizzy or faint [ "Per patient started having blurry vision on Wednesday (2 days ago)]Changes in eyesight (blurry vision, double vision, or other vision problems) [ no]Changes in mood or behavior (ex: decreased sex drive, irritability, or forgetfulness) [Yes related to treatment ]Starting new medications (ex: steroids, other medications that lower immune response) [ He is]Patient is not experiencing any of the general symptoms above.   Gastrointestinal  Patient is having 0.5 bowel movements each day.  Is this different from baseline? [yes ]Yes [ ] No Are your stools watery or do they have a foul smell? [ ] Yes [ x]No Have you seen blood in your stools? [ ] Yes [ x]No Are your stools dark, tarry, or sticky? [ ] Yes [ x]No Are you having pain or tenderness in your belly? [ ] Yes [ x]No  Skin Does your skin itch? [ ] Yes [ x]No Do you have a rash? [ ] Yes [x ]No Has your skin blistered and/or peeled? [ ] Yes [x ]No Do you have sores in your mouth? [ ] Yes [x ]No  Hepatic Has your urine been dark or tea colored? [ x]Yes [ ] No Have you noticed that your skin or the whites of your eyes are turning yellow? [ ] Yes [ x]No Are you bleeding or bruising more easily than normal? [ ] Yes [ x]No Are you nauseous and/or vomiting? [sometimes ]Yes [ ] No Do you have pain on the right side of your stomach? [ ] Yes [ x]No  Neurologic  Are you having unusual weakness of legs, arms, or face? [ ] Yes [x ]No Are you having numbness or tingling in your hands or feet? [ ] Yes [ x]No  Mason Stewart

## 2024-02-26 NOTE — Progress Notes (Signed)
 Met with patient and his wife during treatment today. All questions answered during visit. Reviewed upcoming appts. Encouraged to call with any questions or needs. Nothing further needed at this time.   Labs reviewed and Dr. Randy Buttery informed. Recommended to follow up labs again on Monday.

## 2024-02-29 ENCOUNTER — Inpatient Hospital Stay
Admission: EM | Admit: 2024-02-29 | Discharge: 2024-03-13 | DRG: 871 | Disposition: E | Attending: Student in an Organized Health Care Education/Training Program | Admitting: Student in an Organized Health Care Education/Training Program

## 2024-02-29 ENCOUNTER — Emergency Department

## 2024-02-29 ENCOUNTER — Inpatient Hospital Stay

## 2024-02-29 ENCOUNTER — Other Ambulatory Visit: Payer: Self-pay

## 2024-02-29 ENCOUNTER — Telehealth: Payer: Self-pay

## 2024-02-29 DIAGNOSIS — Z8249 Family history of ischemic heart disease and other diseases of the circulatory system: Secondary | ICD-10-CM

## 2024-02-29 DIAGNOSIS — Z9842 Cataract extraction status, left eye: Secondary | ICD-10-CM

## 2024-02-29 DIAGNOSIS — E876 Hypokalemia: Secondary | ICD-10-CM | POA: Diagnosis present

## 2024-02-29 DIAGNOSIS — I10 Essential (primary) hypertension: Secondary | ICD-10-CM | POA: Diagnosis present

## 2024-02-29 DIAGNOSIS — Z6827 Body mass index (BMI) 27.0-27.9, adult: Secondary | ICD-10-CM

## 2024-02-29 DIAGNOSIS — Z7189 Other specified counseling: Secondary | ICD-10-CM

## 2024-02-29 DIAGNOSIS — R1310 Dysphagia, unspecified: Secondary | ICD-10-CM

## 2024-02-29 DIAGNOSIS — J9601 Acute respiratory failure with hypoxia: Secondary | ICD-10-CM

## 2024-02-29 DIAGNOSIS — Z9079 Acquired absence of other genital organ(s): Secondary | ICD-10-CM

## 2024-02-29 DIAGNOSIS — R0602 Shortness of breath: Secondary | ICD-10-CM | POA: Diagnosis not present

## 2024-02-29 DIAGNOSIS — E43 Unspecified severe protein-calorie malnutrition: Secondary | ICD-10-CM | POA: Insufficient documentation

## 2024-02-29 DIAGNOSIS — C7951 Secondary malignant neoplasm of bone: Secondary | ICD-10-CM | POA: Diagnosis present

## 2024-02-29 DIAGNOSIS — C771 Secondary and unspecified malignant neoplasm of intrathoracic lymph nodes: Secondary | ICD-10-CM | POA: Diagnosis present

## 2024-02-29 DIAGNOSIS — C7949 Secondary malignant neoplasm of other parts of nervous system: Secondary | ICD-10-CM | POA: Diagnosis present

## 2024-02-29 DIAGNOSIS — A419 Sepsis, unspecified organism: Principal | ICD-10-CM | POA: Insufficient documentation

## 2024-02-29 DIAGNOSIS — C7931 Secondary malignant neoplasm of brain: Secondary | ICD-10-CM | POA: Diagnosis present

## 2024-02-29 DIAGNOSIS — Z634 Disappearance and death of family member: Secondary | ICD-10-CM

## 2024-02-29 DIAGNOSIS — R053 Chronic cough: Secondary | ICD-10-CM | POA: Diagnosis present

## 2024-02-29 DIAGNOSIS — K668 Other specified disorders of peritoneum: Secondary | ICD-10-CM | POA: Diagnosis present

## 2024-02-29 DIAGNOSIS — Z9889 Other specified postprocedural states: Secondary | ICD-10-CM

## 2024-02-29 DIAGNOSIS — Z931 Gastrostomy status: Secondary | ICD-10-CM

## 2024-02-29 DIAGNOSIS — C7801 Secondary malignant neoplasm of right lung: Secondary | ICD-10-CM | POA: Diagnosis present

## 2024-02-29 DIAGNOSIS — R6520 Severe sepsis without septic shock: Secondary | ICD-10-CM | POA: Diagnosis present

## 2024-02-29 DIAGNOSIS — U099 Post covid-19 condition, unspecified: Secondary | ICD-10-CM | POA: Diagnosis present

## 2024-02-29 DIAGNOSIS — Z8546 Personal history of malignant neoplasm of prostate: Secondary | ICD-10-CM

## 2024-02-29 DIAGNOSIS — G4730 Sleep apnea, unspecified: Secondary | ICD-10-CM | POA: Diagnosis present

## 2024-02-29 DIAGNOSIS — Z7984 Long term (current) use of oral hypoglycemic drugs: Secondary | ICD-10-CM

## 2024-02-29 DIAGNOSIS — Z820 Family history of epilepsy and other diseases of the nervous system: Secondary | ICD-10-CM

## 2024-02-29 DIAGNOSIS — I4510 Unspecified right bundle-branch block: Secondary | ICD-10-CM | POA: Diagnosis present

## 2024-02-29 DIAGNOSIS — Z66 Do not resuscitate: Secondary | ICD-10-CM | POA: Diagnosis not present

## 2024-02-29 DIAGNOSIS — E1140 Type 2 diabetes mellitus with diabetic neuropathy, unspecified: Secondary | ICD-10-CM | POA: Diagnosis present

## 2024-02-29 DIAGNOSIS — G9340 Encephalopathy, unspecified: Secondary | ICD-10-CM | POA: Diagnosis not present

## 2024-02-29 DIAGNOSIS — Z91048 Other nonmedicinal substance allergy status: Secondary | ICD-10-CM

## 2024-02-29 DIAGNOSIS — H409 Unspecified glaucoma: Secondary | ICD-10-CM | POA: Diagnosis present

## 2024-02-29 DIAGNOSIS — Z7969 Long term (current) use of other immunomodulators and immunosuppressants: Secondary | ICD-10-CM

## 2024-02-29 DIAGNOSIS — Z802 Family history of malignant neoplasm of other respiratory and intrathoracic organs: Secondary | ICD-10-CM

## 2024-02-29 DIAGNOSIS — Z95828 Presence of other vascular implants and grafts: Secondary | ICD-10-CM | POA: Diagnosis not present

## 2024-02-29 DIAGNOSIS — Z7401 Bed confinement status: Secondary | ICD-10-CM

## 2024-02-29 DIAGNOSIS — C439 Malignant melanoma of skin, unspecified: Secondary | ICD-10-CM | POA: Diagnosis present

## 2024-02-29 DIAGNOSIS — Z83511 Family history of glaucoma: Secondary | ICD-10-CM

## 2024-02-29 DIAGNOSIS — E1165 Type 2 diabetes mellitus with hyperglycemia: Secondary | ICD-10-CM | POA: Diagnosis present

## 2024-02-29 DIAGNOSIS — Z515 Encounter for palliative care: Secondary | ICD-10-CM | POA: Diagnosis not present

## 2024-02-29 DIAGNOSIS — G9341 Metabolic encephalopathy: Secondary | ICD-10-CM | POA: Diagnosis present

## 2024-02-29 DIAGNOSIS — E860 Dehydration: Secondary | ICD-10-CM | POA: Diagnosis present

## 2024-02-29 DIAGNOSIS — Z888 Allergy status to other drugs, medicaments and biological substances status: Secondary | ICD-10-CM

## 2024-02-29 DIAGNOSIS — K219 Gastro-esophageal reflux disease without esophagitis: Secondary | ICD-10-CM | POA: Diagnosis present

## 2024-02-29 DIAGNOSIS — R54 Age-related physical debility: Secondary | ICD-10-CM | POA: Diagnosis present

## 2024-02-29 DIAGNOSIS — J690 Pneumonitis due to inhalation of food and vomit: Secondary | ICD-10-CM | POA: Diagnosis present

## 2024-02-29 DIAGNOSIS — J159 Unspecified bacterial pneumonia: Secondary | ICD-10-CM | POA: Diagnosis present

## 2024-02-29 DIAGNOSIS — Z85820 Personal history of malignant melanoma of skin: Secondary | ICD-10-CM

## 2024-02-29 LAB — TROPONIN I (HIGH SENSITIVITY)
Troponin I (High Sensitivity): 16 ng/L (ref <18)
Troponin I (High Sensitivity): 21 ng/L — ABNORMAL HIGH (ref <18)

## 2024-02-29 LAB — COMPREHENSIVE METABOLIC PANEL WITH GFR
ALT: 16 U/L (ref 0–44)
AST: 18 U/L (ref 15–41)
Albumin: 3.7 g/dL (ref 3.5–5.0)
Alkaline Phosphatase: 76 U/L (ref 38–126)
Anion gap: 12 (ref 5–15)
BUN: 17 mg/dL (ref 8–23)
CO2: 27 mmol/L (ref 22–32)
Calcium: 9.2 mg/dL (ref 8.9–10.3)
Chloride: 95 mmol/L — ABNORMAL LOW (ref 98–111)
Creatinine, Ser: 0.69 mg/dL (ref 0.61–1.24)
GFR, Estimated: >60 mL/min (ref >60)
Glucose, Bld: 195 mg/dL — ABNORMAL HIGH (ref 70–99)
Potassium: 3.2 mmol/L — ABNORMAL LOW (ref 3.5–5.1)
Sodium: 134 mmol/L — ABNORMAL LOW (ref 135–145)
Total Bilirubin: 2.1 mg/dL — ABNORMAL HIGH (ref 0.0–1.2)
Total Protein: 7.4 g/dL (ref 6.5–8.1)

## 2024-02-29 LAB — CBC WITH DIFFERENTIAL/PLATELET
Abs Immature Granulocytes: 0.28 10*3/uL — ABNORMAL HIGH (ref 0.00–0.07)
Basophils Absolute: 0.1 10*3/uL (ref 0.0–0.1)
Basophils Relative: 0 %
Eosinophils Absolute: 0.2 10*3/uL (ref 0.0–0.5)
Eosinophils Relative: 1 %
HCT: 38.2 % — ABNORMAL LOW (ref 39.0–52.0)
Hemoglobin: 13.9 g/dL (ref 13.0–17.0)
Immature Granulocytes: 1 %
Lymphocytes Relative: 2 %
Lymphs Abs: 0.8 10*3/uL (ref 0.7–4.0)
MCH: 31 pg (ref 26.0–34.0)
MCHC: 36.4 g/dL — ABNORMAL HIGH (ref 30.0–36.0)
MCV: 85.1 fL (ref 80.0–100.0)
Monocytes Absolute: 1.9 10*3/uL — ABNORMAL HIGH (ref 0.1–1.0)
Monocytes Relative: 5 %
Neutro Abs: 31 10*3/uL — ABNORMAL HIGH (ref 1.7–7.7)
Neutrophils Relative %: 91 %
Platelets: 372 10*3/uL (ref 150–400)
RBC: 4.49 MIL/uL (ref 4.22–5.81)
RDW: 12.8 % (ref 11.5–15.5)
Smear Review: NORMAL
WBC: 34.3 10*3/uL — ABNORMAL HIGH (ref 4.0–10.5)
nRBC: 0 % (ref 0.0–0.2)

## 2024-02-29 LAB — CBG MONITORING, ED
Glucose-Capillary: 180 mg/dL — ABNORMAL HIGH (ref 70–99)
Glucose-Capillary: 136 mg/dL — ABNORMAL HIGH (ref 70–99)
Glucose-Capillary: 146 mg/dL — ABNORMAL HIGH (ref 70–99)

## 2024-02-29 LAB — LACTIC ACID, PLASMA: Lactic Acid, Venous: 1.8 mmol/L (ref 0.5–1.9)

## 2024-02-29 LAB — BLOOD GAS, VENOUS
Acid-Base Excess: 9.7 mmol/L — ABNORMAL HIGH (ref 0.0–2.0)
Bicarbonate: 33.5 mmol/L — ABNORMAL HIGH (ref 20.0–28.0)
O2 Saturation: 97.5 %
Patient temperature: 37
pCO2, Ven: 41 mmHg — ABNORMAL LOW (ref 44–60)
pH, Ven: 7.52 — ABNORMAL HIGH (ref 7.25–7.43)
pO2, Ven: 71 mmHg — ABNORMAL HIGH (ref 32–45)

## 2024-02-29 LAB — MRSA NEXT GEN BY PCR, NASAL: MRSA by PCR Next Gen: NOT DETECTED

## 2024-02-29 LAB — MAGNESIUM: Magnesium: 1.7 mg/dL (ref 1.7–2.4)

## 2024-02-29 LAB — BRAIN NATRIURETIC PEPTIDE: B Natriuretic Peptide: 45.5 pg/mL (ref 0.0–100.0)

## 2024-02-29 LAB — PHOSPHORUS: Phosphorus: 2.9 mg/dL (ref 2.5–4.6)

## 2024-02-29 MED ORDER — SODIUM CHLORIDE 0.9 % IV BOLUS
1000.0000 mL | Freq: Once | INTRAVENOUS | Status: AC
  Administered 2024-02-29: 1000 mL via INTRAVENOUS

## 2024-02-29 MED ORDER — ACETAMINOPHEN 160 MG/5ML PO SOLN
960.0000 mg | Freq: Four times a day (QID) | ORAL | Status: DC | PRN
  Filled 2024-02-29: qty 40.6

## 2024-02-29 MED ORDER — HALOPERIDOL LACTATE 5 MG/ML IJ SOLN: 2.0000 mg | Freq: Four times a day (QID) | INTRAMUSCULAR | Status: DC | PRN

## 2024-02-29 MED ORDER — ENOXAPARIN SODIUM 40 MG/0.4ML IJ SOSY
40.0000 mg | PREFILLED_SYRINGE | INTRAMUSCULAR | Status: DC
  Administered 2024-02-29: 40 mg via SUBCUTANEOUS
  Filled 2024-02-29: qty 0.4

## 2024-02-29 MED ORDER — OXYCODONE HCL 5 MG/5ML PO SOLN: 5.0000 mg | ORAL | Status: DC | PRN

## 2024-02-29 MED ORDER — VANCOMYCIN HCL IN DEXTROSE 1-5 GM/200ML-% IV SOLN
1000.0000 mg | Freq: Once | INTRAVENOUS | Status: AC
  Administered 2024-02-29: 1000 mg via INTRAVENOUS
  Filled 2024-02-29: qty 200

## 2024-02-29 MED ORDER — PANTOPRAZOLE SODIUM 40 MG IV SOLR
40.0000 mg | INTRAVENOUS | Status: DC
  Administered 2024-02-29 – 2024-03-01 (×2): 40 mg via INTRAVENOUS
  Filled 2024-02-29 (×2): qty 10

## 2024-02-29 MED ORDER — SODIUM CHLORIDE 0.9 % IV SOLN: INTRAVENOUS | Status: AC

## 2024-02-29 MED ORDER — SODIUM CHLORIDE 0.9 % IV SOLN: 3.0000 g | Freq: Four times a day (QID) | INTRAVENOUS | Status: DC

## 2024-02-29 MED ORDER — METRONIDAZOLE 500 MG/100ML IV SOLN
500.0000 mg | Freq: Once | INTRAVENOUS | Status: AC
  Administered 2024-02-29: 500 mg via INTRAVENOUS
  Filled 2024-02-29: qty 100

## 2024-02-29 MED ORDER — LATANOPROST 0.005 % OP SOLN
1.0000 [drp] | Freq: Two times a day (BID) | OPHTHALMIC | Status: DC
  Administered 2024-02-29 – 2024-03-01 (×2): 1 [drp] via OPHTHALMIC
  Filled 2024-02-29: qty 2.5

## 2024-02-29 MED ORDER — PIPERACILLIN-TAZOBACTAM 3.375 G IVPB
3.3750 g | Freq: Three times a day (TID) | INTRAVENOUS | Status: DC
  Administered 2024-02-29 – 2024-03-01 (×4): 3.375 g via INTRAVENOUS
  Filled 2024-02-29 (×4): qty 50

## 2024-02-29 MED ORDER — IOHEXOL 350 MG/ML SOLN
100.0000 mL | Freq: Once | INTRAVENOUS | Status: AC | PRN
  Administered 2024-02-29: 100 mL via INTRAVENOUS

## 2024-02-29 MED ORDER — ONDANSETRON HCL 4 MG PO TABS: 8.0000 mg | ORAL_TABLET | Freq: Three times a day (TID) | ORAL | Status: DC | PRN

## 2024-02-29 MED ORDER — OXYCODONE HCL 5 MG PO TABS: 5.0000 mg | ORAL_TABLET | ORAL | Status: DC | PRN

## 2024-02-29 MED ORDER — DORZOLAMIDE HCL-TIMOLOL MAL 2-0.5 % OP SOLN
1.0000 [drp] | Freq: Two times a day (BID) | OPHTHALMIC | Status: DC
  Administered 2024-02-29 – 2024-03-01 (×3): 1 [drp] via OPHTHALMIC
  Filled 2024-02-29: qty 10

## 2024-02-29 MED ORDER — SODIUM CHLORIDE 0.9 % IV SOLN
2.0000 g | Freq: Once | INTRAVENOUS | Status: AC
  Administered 2024-02-29: 2 g via INTRAVENOUS
  Filled 2024-02-29: qty 12.5

## 2024-02-29 MED ORDER — POTASSIUM CHLORIDE 10 MEQ/100ML IV SOLN
10.0000 meq | INTRAVENOUS | Status: AC
  Administered 2024-02-29 (×2): 10 meq via INTRAVENOUS
  Filled 2024-02-29 (×2): qty 100

## 2024-02-29 MED ORDER — INSULIN ASPART 100 UNIT/ML IJ SOLN
0.0000 [IU] | Freq: Three times a day (TID) | INTRAMUSCULAR | Status: DC
  Administered 2024-02-29: 3 [IU] via SUBCUTANEOUS
  Administered 2024-03-01 (×2): 2 [IU] via SUBCUTANEOUS
  Filled 2024-02-29 (×3): qty 1

## 2024-02-29 NOTE — ED Notes (Addendum)
 Linens and brief changed, pt suctioned

## 2024-02-29 NOTE — H&P (Addendum)
 History and Physical    Mason Stewart RUE:[id number] DOB: 1951-09-16 DOA: 02/29/2024  PCP: Helaine Llanos, MD (Confirm with patient/family/NH records and if not entered, this has to be entered at [facility] point of entry) Patient coming from: Home  I have personally briefly reviewed patient's old medical records in [facility]  Chief Complaint: Cough, hypoxia and poor feeding  HPI: Mason Stewart is a 73 y.o. 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  Patient was diagnosed with metastatic melanoma (recurrent disease, was initially diagnosed with melanoma in 2015), workup has found so far patient had major metastatic lesions in right lower lobe lung as well as leptomeningeal metastatic disease/carcinomatosis and patient was started on immunotherapy last week.  Patient had 4 session of immunotherapy on Friday and had a few vomiting but symptoms subsided the same day.  At baseline patient also has a worsening of dysphagia, last week patient also underwent PEG tube insertion and started on feeding.  Over the weekend, patient family found patient has very poor tolerance to the tube feeding, and has had frequent feeling of nauseous vomiting.  Overnight patient started to have worsening of cough and family found copious amount of secretions in his mouth and oxygen level dropped, and patient more confused.  ED Course: Tachycardia afebrile, blood pressure 140/80 O2 saturation 97% on 6 L.  CTA chest showed negative PE,  but right middle lobe and lower lobe pneumonia likely aspiration in nature, with secretion on the right bronchus.  Blood work showed WBC 34, BUN 17  creatinine 0.9 glucose 195 ABG 7 point 5/41/71 K3.2.  CT abdomen pelvis showed free air in the peritoneum cavity suspicious for leaking of the G-tube.  And patient was evaluated in the ED by surgeon.  Patient was given cefepime  vancomycin  and Flagyl  in the ED.  IV bolus total of 2000 ml.  Review of Systems: Unable to perform, patient is lethargic and confused.  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 Cornett, MD;  Location: [facility];  Service: General;  Laterality: Right;   BACK SURGERY  10/14/1995   lower back   BRONCHOSCOPY, WITH BIOPSY USING ELECTROMAGNETIC NAVIGATION Bilateral 02/16/2024   Procedure: ROBOTIC ASSISTED NAVIGATIONAL BRONCHOSCOPY;  Surgeon: Vergia Glasgow, MD;  Location: [facility];  Service: Pulmonary;  Laterality: Bilateral;   CARPAL TUNNEL RELEASE Right 03/14/2023   colonscopy  10/13/2010   CREATION, GASTROSTOMY, OPEN N/A 02/23/2024   Procedure: CREATION, GASTROSTOMY, OPEN;  Surgeon: Emmalene Hare, MD;  Location:  [facility];  Service: General;  Laterality: N/A;   EXCISION MELANOMA WITH SENTINEL LYMPH NODE BIOPSY Right 11/23/2013   Procedure: EXCISION MELANOMA WITH SENTINEL LYMPH NODE BIOPSY;  Surgeon: Brandy Cal. Cornett, MD;  Location: [facility];  Service: General;  Laterality: Right;   EYE  SURGERY     cataract left, reduced pressure in left   left middle finger surgery after injury  yrs ago   PORTACATH PLACEMENT N/A 02/23/2024   Procedure: INSERTION, TUNNELED CENTRAL VENOUS DEVICE, WITH PORT;  Surgeon: Emmalene Hare, MD;  Location: [facility];  Service: General;  Laterality: N/A;   ROBOT ASSISTED LAPAROSCOPIC RADICAL PROSTATECTOMY  06/07/2012   Procedure: ROBOTIC ASSISTED LAPAROSCOPIC RADICAL PROSTATECTOMY LEVEL 2;  Surgeon: Kristeen Peto, MD;  Location: [facility];  Service: Urology;  Laterality: N/A;         reports that he has never smoked. He has never been exposed to tobacco smoke. He has never used smokeless tobacco. He reports current alcohol use. He reports that he does not use drugs.  Allergies  Allergen Reactions   Lisinopril Cough   Pollen Extract Cough    Family History  Problem Relation Age of Onset   Heart disease Mother    Cancer Father        mesthelioma   Glaucoma Father    Hypertension Sister    Hypertension Brother    Arrhythmia Brother    Birth defects Son        Batton's disease   Glaucoma Sister      Prior to Admission medications   Medication Sig Start Date End Date Taking? Authorizing Provider  dorzolamide -timolol  (COSOPT ) 22.3-6.8 MG/ML ophthalmic solution Place 1 drop into both eyes 2 (two) times daily.   Yes [provider]  latanoprost  (XALATAN ) 0.005 % ophthalmic solution Place 1 drop into both eyes 2 (two) times daily.   Yes [provider]  losartan -hydrochlorothiazide (HYZAAR) 50-12.5 MG tablet TAKE 1 TABLET BY MOUTH EVERY DAY 01/06/24  Yes Letvak, Richard I, MD  metFORMIN  (GLUCOPHAGE -XR) 500 MG 24 hr tablet TAKE 1 TABLET BY MOUTH EVERY DAY WITH BREAKFAST 11/06/23  Yes Letvak, Richard I, MD  Multiple Vitamin (MULTI-VITAMIN) tablet Take 1 tablet by mouth daily.   Yes [provider]  ondansetron  (ZOFRAN ) 8 MG tablet Take 1 tablet (8 mg total) by mouth every 8 (eight) hours as needed for nausea or vomiting. 02/19/24   Yes Avonne Boettcher, MD  oxyCODONE  (ROXICODONE ) 5 MG/5ML solution Take 5 mLs (5 mg total) by mouth every 4 (four) hours as needed for severe pain (pain score 7-10). 02/23/24  Yes Piscoya, Volanda Gruber, MD  prochlorperazine  (COMPAZINE ) 10 MG tablet Take 1 tablet (10 mg total) by mouth every 6 (six) hours as needed for nausea or vomiting. 02/19/24  Yes Avonne Boettcher, MD  acetaminophen  (TYLENOL ) 160 MG/5ML solution Take 30 mLs (960 mg total) by mouth every 6 (six) hours as needed for mild pain (pain score 1-3). 02/23/24   Emmalene Hare, MD  benzonatate  (TESSALON ) 200 MG capsule Take 1 capsule (200 mg total) by mouth 3 (three) times daily as needed for cough. Patient not taking: Reported on 02/29/2024 12/14/23   Helaine Llanos, MD  cyanocobalamin 1000 MCG tablet Take 1 tablet by mouth every other day. 250 MG    [provider]  ibuprofen  (ADVIL ) 100 MG/5ML suspension Take 30 mLs (600 mg total) by mouth every 8 (eight) hours as needed for moderate pain (pain score 4-6). 02/23/24   Piscoya,  Jose, MD  lidocaine -prilocaine  (EMLA ) cream Apply to affected area once 02/19/24   Avonne Boettcher, MD  Nutritional Supplements (NUTREN 1.5) LIQD Give 1 carton 6 times a day in feeding tube via bolus syringe.  Flush with 60ml of water  before and of water  after each feeding.   Provide equivalent formula if current formula not available. 02/23/24   Avonne Boettcher, MD  omeprazole  (PRILOSEC  OTC) 20 MG tablet Take 20 mg by mouth as needed.     [provider]  rosuvastatin  (CRESTOR ) 10 MG tablet Take 1 tablet (10 mg total) by mouth 2 (two) times a week. 06/11/23   Helaine Llanos, MD  tadalafil  (CIALIS ) 20 MG tablet Take 0.5-1 tablets (10-20 mg total) by mouth every other day as needed for erectile dysfunction. Patient not taking: Reported on 02/23/2024 11/03/22   Letvak, Richard I, MD  zinc gluconate 50 MG tablet Take 50 mg by mouth daily.    [provider]    Physical Exam: Vitals:   02/29/24 0730  02/29/24 0800 02/29/24 0830 02/29/24 0900  BP: 126/73 116/78 (!) 148/104 (!) 146/81  Pulse: (!) 104 (!) 118 (!) 121 (!) 113  Resp: (!) 22 (!) 23 (!) 22 19  Temp:    98.4 F (36.9 C)  TempSrc:    Axillary  SpO2: 94% 99% 98% 97%  Weight:      Height:        Constitutional: NAD, calm, comfortable Vitals:   02/29/24 0730 02/29/24 0800 02/29/24 0830 02/29/24 0900  BP: 126/73 116/78 (!) 148/104 (!) 146/81  Pulse: (!) 104 (!) 118 (!) 121 (!) 113  Resp: (!) 22 (!) 23 (!) 22 19  Temp:    98.4 F (36.9 C)  TempSrc:    Axillary  SpO2: 94% 99% 98% 97%  Weight:      Height:       Eyes: PERRL, lids and conjunctivae normal ENMT: Mucous membranes are dry. Posterior pharynx clear of any exudate or lesions.Normal dentition.  Neck: normal, supple, no masses, no thyromegaly Respiratory: clear to auscultation bilaterally, no wheezing, coarse crackles on right side, increasing respiratory effort. No accessory muscle use.  Cardiovascular: Regular rate and rhythm, no murmurs / rubs / gallops. No extremity edema. 2+ pedal pulses. No carotid bruits.  Abdomen: no tenderness, no masses palpated. No hepatosplenomegaly. Bowel sounds positive.  Musculoskeletal: no clubbing / cyanosis. No joint deformity upper and lower extremities. Good ROM, no contractures. Normal muscle tone.  Skin: no rashes, lesions, ulcers. No induration Neurologic: No facial droops, moving all limbs, following consent, Psychiatric: Lethargic, arousable to voice, confused    Labs on Admission: I have personally reviewed following labs and imaging studies  CBC: Recent Labs  Lab 02/26/24 1305 02/29/24 0557  WBC 13.1* 34.3*  NEUTROABS 10.8* 31.0*  HGB 12.5* 13.9  HCT 34.4* 38.2*  MCV 84.9 85.1  PLT 296 372   Basic Metabolic Panel: Recent Labs  Lab 02/26/24 1255 02/26/24 1304 02/29/24 0557  NA  --  131* 134*  K  --  3.4* 3.2*  CL  --  95* 95*  CO2  --  27 27  GLUCOSE  --  148* 195*  BUN  --  14 17  CREATININE  --   0.70 0.69  CALCIUM   --  8.8* 9.2  MG 2.1  --   --   PHOS 3.0  --   --    GFR: Estimated Creatinine Clearance: 80.5 mL/min (by C-G formula based on  SCr of 0.69 mg/dL). Liver Function Tests: Recent Labs  Lab 02/26/24 1304 02/29/24 0557  AST 19 18  ALT 14 16  ALKPHOS 77 76  BILITOT 1.1 2.1*  PROT 7.1 7.4  ALBUMIN 3.5 3.7   No results for input(s): "LIPASE", "AMYLASE" in the last 168 hours. No results for input(s): "AMMONIA" in the last 168 hours. Coagulation Profile: No results for input(s): "INR", "PROTIME" in the last 168 hours. Cardiac Enzymes: No results for input(s): "CKTOTAL", "CKMB", "CKMBINDEX", "TROPONINI" in the last 168 hours. BNP (last 3 results) No results for input(s): "PROBNP" in the last 8760 hours. HbA1C: No results for input(s): "HGBA1C" in the last 72 hours. CBG: Recent Labs  Lab 02/23/24 0619 02/23/24 1035  GLUCAP 138* 121*   Lipid Profile: No results for input(s): "CHOL", "HDL", "LDLCALC", "TRIG", "CHOLHDL", "LDLDIRECT" in the last 72 hours. Thyroid  Function Tests: Recent Labs    02/26/24 1304  TSH 0.114*  FREET4 1.56*   Anemia Panel: No results for input(s): "VITAMINB12", "FOLATE", "FERRITIN", "TIBC", "IRON", "RETICCTPCT" in the last 72 hours. Urine analysis:    Component Value Date/Time   COLORURINE YELLOW (A) 02/01/2024 1022   APPEARANCEUR CLEAR (A) 02/01/2024 1022   LABSPEC 1.013 02/01/2024 1022   PHURINE 7.0 02/01/2024 1022   GLUCOSEU 50 (A) 02/01/2024 1022   HGBUR NEGATIVE 02/01/2024 1022   BILIRUBINUR NEGATIVE 02/01/2024 1022   KETONESUR NEGATIVE 02/01/2024 1022   PROTEINUR NEGATIVE 02/01/2024 1022   NITRITE NEGATIVE 02/01/2024 1022   LEUKOCYTESUR NEGATIVE 02/01/2024 1022    Radiological Exams on Admission: CT Angio Chest PE W and/or Wo Contrast Result Date: 02/29/2024 CLINICAL DATA:  Known right lower lobe mass/neoplasm, PET positive, presenting with increased weakness and hypoxia with increased work of breathing. The patient  had an open gastrostomy creation 02/23/2024. EXAM: CT ANGIOGRAPHY CHEST CT ABDOMEN AND PELVIS WITH CONTRAST TECHNIQUE: Multidetector CT imaging of the chest was performed using the standard protocol during bolus administration of intravenous contrast. Multiplanar CT image reconstructions and MIPs were obtained to evaluate the vascular anatomy. Multidetector CT imaging of the abdomen and pelvis was performed using the standard protocol during bolus administration of intravenous contrast. RADIATION DOSE REDUCTION: This exam was performed according to the departmental dose-optimization program which includes automated exposure control, adjustment of the mA and/or kV according to patient size and/or use of iterative reconstruction technique. CONTRAST:  OMNIPAQUE  IOHEXOL  350 MG/ML SOLN COMPARISON:  Chest CT without contrast 02/15/2024, PET-CT 02/04/2024, and a portable chest from 02/23/2024 and today. FINDINGS: CTA CHEST FINDINGS Cardiovascular: There is a new small anterior pericardial effusion. The cardiac size is normal. There are left main and 2 vessel coronary calcifications in the LAD and right coronary artery, heaviest in the LAD. There is a new left chest implanted port, with subclavian approach catheter terminating at about the brachiocephalic/SVC junction. There is atherosclerosis in the aorta and great vessels without aneurysm, dissection or stenosis. No pulmonary arterial dilatation or embolism is seen. Pulmonary veins are normal caliber. There is bronchovascular mass encasement and narrowing along the lower hilar area in the lower lobe. Mediastinum/Nodes: There are increasingly prominent lymph nodes in the azygoesophageal recess, largest 1.5 x 2.5 cm on 2:90. There are increasingly prominent right lower hilar lymph nodes difficult to separate out from the right lower lobe mass. Largest of these is estimated 1.9 cm short axis on 2:91. There is an enlarging right mid hilar node of 1.5 cm on 2: 76,  additional subcarinal lymph nodes up to 1 cm. I do  not see further adenopathy. Lungs/Pleura: There is moderate fluid consistent with aspiration in the trachea, tracking down into right middle and lower lobe bronchi. Fluid is also seen in the right upper lobe main bronchus and there is diffuse bronchial thickening. There are multiple subsegmental and a few segmental fluid opacified bronchi in the right lower lobe basal segments with the right lower lobe distal main bronchus narrowed by the tumor. There is scattered subsegmental bronchial impactions in the right middle lobe. Right lower lobe mass abutting the hilum is centered in the superior segment and measures 7 x 5.6 cm on 2:10, not larger than on 02/15/2024 but on 02/04/2024 measured 6.5 x 5.5 cm. There is patchy ground-glass airspace disease in the left upper lobe, patchy denser ground-glass intermixed with peribronchovascular nodular airspace disease in the right upper lobe, ill-defined scattered hazy airspace disease in left lower lobe. In the right lower lobe, in addition to a fluid impactions in multiple subsegmental and downstream bronchi, there is patchy bronchopneumonia greatest in the superior segment, with patchy tree-in-bud and ground-glass airspace disease in the right middle lobe. Findings consistent with multilobar pneumonia. No fluid is seen in the left-sided bronchi. There are no pleural effusions. Musculoskeletal: Degenerative change thoracic spine. No regional bone metastasis. At T8-9 and T9-10 there small left paracentral calcified disc extrusions without overt mass effect. Review of the MIP images confirms the above findings. CT ABDOMEN and PELVIS FINDINGS Hepatobiliary: No liver metastasis is seen. Unremarkable gallbladder and bile ducts. Pancreas: No abnormality. Spleen: No abnormality. Adrenals/Urinary Tract: No adrenal renal mass enhancement. There is a 2 cm right lower pole parapelvic Bosniak 2 cyst with a Hounsfield density of 27, not  requiring follow-up imaging. There is a parapelvic cyst in the lower left kidney which is too small to characterize consistent with another Bosniak 2 cyst. There is a 3 mm caliceal stone in the midpole of the left kidney. No other urolithiasis is seen and no obstructing stones. Normal bladder. Stomach/Bowel: There is a PEG tube in place. The balloon is in the stomach. There is moderate free air in the upper abdomen, more than expected 6 days out from PEG placement. A gastric leak is not excluded but there is no free fluid. Furthermore no focal inflammatory changes seen around the where the tube enters the stomach. Clinical correlation follow-up recommended. No bowel dilatation or wall thickening is seen. The appendix is normal. There is sigmoid diverticulosis, without diverticulitis. Vascular/Lymphatic: Aortic atherosclerosis. No enlarged abdominal or pelvic lymph nodes. Reproductive: The prostate appears to have been removed. No mass is seen in the prostate bed. There are no surgical clips in the pelvic sidewalls. Other: None. Musculoskeletal: There is no focally destructive lesion. The PET-CT demonstrated abnormal activity in the posterior superior facet of the left greater trochanter concerning for metastatic disease, but which does not have a CT correlate. There is degenerative change of the lumbar spine most advanced at the lowest 2 levels where there is acquired spinal stenosis. 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 Review of the MIP images confirms the above findings. IMPRESSION: 1. No evidence of pulmonary arterial dilatation or embolus. 2. 7 x 5.6 cm right lower lobe mass centered in the superior segment, not larger than on 02/15/2024 but slightly increased since 02/04/2024. 3. Aspiration fluid in the trachea, right middle and lower lobe bronchi, some in the right upper lobe main bronchus, with diffuse bronchial thickening and with multilobar pneumonia described in detail above, right greater  than left. 4. Increasingly  prominent mediastinal and right hilar lymph nodes. 5. New small anterior pericardial effusion. 6. Aortic and coronary artery atherosclerosis. 7. PEG tube in place with the balloon in the stomach. There is moderate free air in the upper abdomen more than expected 6 days out from PEG placement. A gastric leak is not excluded but there is no free fluid. Furthermore no focal inflammatory changes seen around the where the tube enters the stomach. Clinical correlation and follow-up recommended. 8. No evidence of metastatic disease in the abdomen or pelvis. 9. 3 mm nonobstructing left renal stone. 10. Diverticulosis without evidence of diverticulitis. 11. The PET-CT demonstrated abnormal activity in the posterior superior facet of the left greater trochanter concerning for metastatic disease, but which does not have a CT correlate. 12. Critical Value/emergent results were called by telephone at the time of interpretation on 02/29/2024 at 7:36 am to provider DR. Cleora Daft, who verbally acknowledged these results. Aortic Atherosclerosis (ICD10-I70.0). Electronically Signed   By: Denman Fischer M.D.   On: 02/29/2024 08:11   CT ABDOMEN PELVIS W CONTRAST Result Date: 02/29/2024 CLINICAL DATA:  Known right lower lobe mass/neoplasm, PET positive, presenting with increased weakness and hypoxia with increased work of breathing. The patient had an open gastrostomy creation 02/23/2024. EXAM: CT ANGIOGRAPHY CHEST CT ABDOMEN AND PELVIS WITH CONTRAST TECHNIQUE: Multidetector CT imaging of the chest was performed using the standard protocol during bolus administration of intravenous contrast. Multiplanar CT image reconstructions and MIPs were obtained to evaluate the vascular anatomy. Multidetector CT imaging of the abdomen and pelvis was performed using the standard protocol during bolus administration of intravenous contrast. RADIATION DOSE REDUCTION: This exam was performed according to the departmental  dose-optimization program which includes automated exposure control, adjustment of the mA and/or kV according to patient size and/or use of iterative reconstruction technique. CONTRAST:  OMNIPAQUE  IOHEXOL  350 MG/ML SOLN COMPARISON:  Chest CT without contrast 02/15/2024, PET-CT 02/04/2024, and a portable chest from 02/23/2024 and today. FINDINGS: CTA CHEST FINDINGS Cardiovascular: There is a new small anterior pericardial effusion. The cardiac size is normal. There are left main and 2 vessel coronary calcifications in the LAD and right coronary artery, heaviest in the LAD. There is a new left chest implanted port, with subclavian approach catheter terminating at about the brachiocephalic/SVC junction. There is atherosclerosis in the aorta and great vessels without aneurysm, dissection or stenosis. No pulmonary arterial dilatation or embolism is seen. Pulmonary veins are normal caliber. There is bronchovascular mass encasement and narrowing along the lower hilar area in the lower lobe. Mediastinum/Nodes: There are increasingly prominent lymph nodes in the azygoesophageal recess, largest 1.5 x 2.5 cm on 2:90. There are increasingly prominent right lower hilar lymph nodes difficult to separate out from the right lower lobe mass. Largest of these is estimated 1.9 cm short axis on 2:91. There is an enlarging right mid hilar node of 1.5 cm on 2: 76, additional subcarinal lymph nodes up to 1 cm. I do not see further adenopathy. Lungs/Pleura: There is moderate fluid consistent with aspiration in the trachea, tracking down into right middle and lower lobe bronchi. Fluid is also seen in the right upper lobe main bronchus and there is diffuse bronchial thickening. There are multiple subsegmental and a few segmental fluid opacified bronchi in the right lower lobe basal segments with the right lower lobe distal main bronchus narrowed by the tumor. There is scattered subsegmental bronchial impactions in the right middle  lobe. Right lower lobe mass abutting the hilum is centered in  the superior segment and measures 7 x 5.6 cm on 2:10, not larger than on 02/15/2024 but on 02/04/2024 measured 6.5 x 5.5 cm. There is patchy ground-glass airspace disease in the left upper lobe, patchy denser ground-glass intermixed with peribronchovascular nodular airspace disease in the right upper lobe, ill-defined scattered hazy airspace disease in left lower lobe. In the right lower lobe, in addition to a fluid impactions in multiple subsegmental and downstream bronchi, there is patchy bronchopneumonia greatest in the superior segment, with patchy tree-in-bud and ground-glass airspace disease in the right middle lobe. Findings consistent with multilobar pneumonia. No fluid is seen in the left-sided bronchi. There are no pleural effusions. Musculoskeletal: Degenerative change thoracic spine. No regional bone metastasis. At T8-9 and T9-10 there small left paracentral calcified disc extrusions without overt mass effect. Review of the MIP images confirms the above findings. CT ABDOMEN and PELVIS FINDINGS Hepatobiliary: No liver metastasis is seen. Unremarkable gallbladder and bile ducts. Pancreas: No abnormality. Spleen: No abnormality. Adrenals/Urinary Tract: No adrenal renal mass enhancement. There is a 2 cm right lower pole parapelvic Bosniak 2 cyst with a Hounsfield density of 27, not requiring follow-up imaging. There is a parapelvic cyst in the lower left kidney which is too small to characterize consistent with another Bosniak 2 cyst. There is a 3 mm caliceal stone in the midpole of the left kidney. No other urolithiasis is seen and no obstructing stones. Normal bladder. Stomach/Bowel: There is a PEG tube in place. The balloon is in the stomach. There is moderate free air in the upper abdomen, more than expected 6 days out from PEG placement. A gastric leak is not excluded but there is no free fluid. Furthermore no focal inflammatory changes seen  around the where the tube enters the stomach. Clinical correlation follow-up recommended. No bowel dilatation or wall thickening is seen. The appendix is normal. There is sigmoid diverticulosis, without diverticulitis. Vascular/Lymphatic: Aortic atherosclerosis. No enlarged abdominal or pelvic lymph nodes. Reproductive: The prostate appears to have been removed. No mass is seen in the prostate bed. There are no surgical clips in the pelvic sidewalls. Other: None. Musculoskeletal: There is no focally destructive lesion. The PET-CT demonstrated abnormal activity in the posterior superior facet of the left greater trochanter concerning for metastatic disease, but which does not have a CT correlate. There is degenerative change of the lumbar spine most advanced at the lowest 2 levels where there is acquired spinal stenosis. 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 Review of the MIP images confirms the above findings. IMPRESSION: 1. No evidence of pulmonary arterial dilatation or embolus. 2. 7 x 5.6 cm right lower lobe mass centered in the superior segment, not larger than on 02/15/2024 but slightly increased since 02/04/2024. 3. Aspiration fluid in the trachea, right middle and lower lobe bronchi, some in the right upper lobe main bronchus, with diffuse bronchial thickening and with multilobar pneumonia described in detail above, right greater than left. 4. Increasingly prominent mediastinal and right hilar lymph nodes. 5. New small anterior pericardial effusion. 6. Aortic and coronary artery atherosclerosis. 7. PEG tube in place with the balloon in the stomach. There is moderate free air in the upper abdomen more than expected 6 days out from PEG placement. A gastric leak is not excluded but there is no free fluid. Furthermore no focal inflammatory changes seen around the where the tube enters the stomach. Clinical correlation and follow-up recommended. 8. No evidence of metastatic disease in the abdomen or  pelvis. 9. 3  mm nonobstructing left renal stone. 10. Diverticulosis without evidence of diverticulitis. 11. The PET-CT demonstrated abnormal activity in the posterior superior facet of the left greater trochanter concerning for metastatic disease, but which does not have a CT correlate. 12. Critical Value/emergent results were called by telephone at the time of interpretation on 02/29/2024 at 7:36 am to provider DR. Cleora Daft, who verbally acknowledged these results. Aortic Atherosclerosis (ICD10-I70.0). Electronically Signed   By: Denman Fischer M.D.   On: 02/29/2024 08:11   DG Chest Port 1 View Result Date: 02/29/2024 CLINICAL DATA:  Increased shortness of breath. EXAM: PORTABLE CHEST 1 VIEW COMPARISON:  02/23/2024 FINDINGS: There is a left chest wall porta catheter with tip in the projection of the SVC. Heart size and mediastinal contours are unremarkable. Right lower lobe lung mass is again noted compatible with malignancy. No superimposed pleural effusion, edema or airspace consolidation. Interval decrease in pneumoperitoneum compared with the previous exam. Visualized osseous structures appear intact. IMPRESSION: 1. No acute cardiopulmonary abnormalities. 2. Right lower lobe lung mass compatible with malignancy. 3. Interval decrease in pneumoperitoneum compared with the previous exam. Electronically Signed   By: Kimberley Penman M.D.   On: 02/29/2024 06:23    EKG: Independently reviewed.  Sinus tachycardia, no acute ST changes.  Assessment/Plan Principal Problem:   Aspiration pneumonia (HCC) Active Problems:   Dysphagia   Sepsis (HCC)   Acute respiratory failure with hypoxia (HCC)  (please populate well all problems here in Problem List. (For example, if patient is on BP meds at home and you resume or decide to hold them, it is a problem that needs to be her. 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  Etiology probably unlikely will be resolved anytime soon.  As patient just started on immunotherapy recently.  Discussed with family regarding prognosis, family expressed they are still in discussion with oncology regarding the issue and patient was seen by palliative care 1 time last month.  Family including daughter and wife are anxious to make decision regarding CODE STATUS changes before patient is able for such conversation. Full Code for now. - Aspiration precaution, HOB 30 degree - Consult case management for arranging hospital bed at home as well as suction equipment at home.  Free air in the peritoneal cavity - Abdomen benign on palpation bowel sounds normal.  No significant leakage around the PEG tube insertion site.  Discussed with general surgeon who is planning patient earlier today and reviewed the CT abdomen pelvis.  Surgeon's opinion is likely the free air was trapped in the peritoneal cavity from PEG tube insertion, active leakage from PEG tube unlikely.  Acute metabolic encephalopathy - Likely secondary to sepsis, manage sepsis then reevaluate.  Severe sepsis, with acute endorgan damage - Sepsis evidenced by hypoxia leukocytosis, source of infection is right-sided aspiration pneumonia and management as above.  Blood pressure was low and requiring IV boluses in the ED.  Acute endorgan damage including metabolic encephalopathy.  Elevated glucose - Unknown etiology, most recent A1c 2 months ago was 5.5 - Check fingerstick, SSI  Hypokalemia - IV replacement - Check magnesium  and phosphorus level  Metastatic melanoma, recurrent - Prognosis not entirely clear at this point as patient was just started on immunotherapy - Imaging studies so far has showed multiple metastatic sites including brain right side of the lungs,  left hip, L3-4.  DVT prophylaxis: Lovenox  Code  Status: Full code Family Communication: Wife and daughter at bedside Disposition Plan: Sick with severe sepsis requiring IV antibiotics, expect more than 2 midnight hospital stay Consults called: Palliative care Admission status: PCU admit   [place] MD [facility] Pager [phone]  02/29/2024, 11:06 AM

## 2024-02-29 NOTE — ED Provider Notes (Signed)
-----------------------------------------   7:04 AM on 02/29/2024 -----------------------------------------  Blood pressure 115/69, pulse (!) 124, temperature 98.6 F (37 C), temperature source Oral, resp. rate (!) 25, height 5\' 6"  (1.676 m), weight 77.3 kg, SpO2 93%.  Assuming care from Dr. Vicenta Graft.  In short, Mason Stewart is a 73 y.o. male with a chief complaint of Shortness of Breath .  Refer to the original H&P for additional details.  The current plan of care is to follow-up CT imaging for sepsis vs PE.  ----------------------------------------- 8:05 AM on 02/29/2024 ----------------------------------------- CTA chest is negative for PE but does show significant right lower lobe pneumonia consistent with aspiration.  Nursing to perform suctioning of a significant amount of tube feed from the back of his throat also consistent with aspiration.  CT of his abdomen/pelvis does show significant amount of free air but no free fluid or obvious bowel perforation, may be residual from recent gastrostomy placement.  Case discussed with general surgery, who will evaluate the patient.  Case discussed with hospitalist for admission.       Twilla Galea, MD 02/29/24 [phone]

## 2024-02-29 NOTE — Telephone Encounter (Signed)
 Clinical Social Work was referred by medical provider due to distress screening score for assessment of psychosocial needs.  CSW attempted to contact patient by phone.  Left voicemail with contact information and request for return call.

## 2024-02-29 NOTE — ED Notes (Signed)
 MD notified of continued need for deep suctioning as well as the color of the secretions.

## 2024-02-29 NOTE — ED Notes (Signed)
 RN continuing to deep suction pt as needed due to having secretions and fluid that appears to be the color of tube feel in the trachea.

## 2024-02-29 NOTE — Consult Note (Signed)
 Palliative Medicine [facility] at [facility] Telephone:([phone] Fax:([phone]) [phone]   Name: Mason Stewart Date: 02/29/2024 MRN: [id number]  DOB: 11-23-1950  Patient Care Team: Helaine Llanos, MD as PCP - General (Internal Medicine) Arlen Lacks, Harrell Lima, MD as Referring Physician (Ophthalmology) Gaynelle Keeling, MD as Consulting Physician (Dermatology) Drake Gens, RN as Oncology Nurse Navigator    REASON FOR CONSULTATION: JAKING THAYER is a 73 y.o. male with multiple medical problems including  history of melanoma and prostate cancer, now with stage IV melanoma with leptomeningeal spread.  Patient was started on immunotherapy.  Underwent PEG placement due to severe dysphagia and poor oral intake.  Patient now admitted to the hospital with aspiration pneumonia.  Palliative care consulted to address goals.  SOCIAL HISTORY:     reports that he has never smoked. He has never been exposed to tobacco smoke. He has never used smokeless tobacco. He reports current alcohol use. He reports that he does not use drugs.  Patient is married and lives at home with his wife.  They have two daughters who live nearby.  Patient had a son who died in 32 at age 18 Batten's disease.  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 Cornett, MD;  Location: [facility];  Service: General;  Laterality: Right;   BACK SURGERY  10/14/1995   lower back   BRONCHOSCOPY, WITH BIOPSY USING ELECTROMAGNETIC NAVIGATION Bilateral 02/16/2024   Procedure: ROBOTIC ASSISTED NAVIGATIONAL BRONCHOSCOPY;  Surgeon: Vergia Glasgow, MD;  Location: [facility];  Service: Pulmonary;  Laterality: Bilateral;   CARPAL TUNNEL RELEASE Right 03/14/2023   colonscopy  10/13/2010   CREATION, GASTROSTOMY, OPEN N/A 02/23/2024   Procedure: CREATION, GASTROSTOMY, OPEN;  Surgeon: Emmalene Hare, MD;  Location: [facility];  Service: General;  Laterality: N/A;   EXCISION MELANOMA WITH SENTINEL LYMPH NODE BIOPSY Right 11/23/2013   Procedure: EXCISION MELANOMA WITH SENTINEL LYMPH NODE BIOPSY;  Surgeon: Brandy Cal. Cornett, MD;  Location: [facility];  Service: General;  Laterality: Right;   EYE SURGERY     cataract left, reduced pressure in left   left middle finger surgery after injury  yrs ago   PORTACATH PLACEMENT N/A 02/23/2024   Procedure: INSERTION, TUNNELED CENTRAL VENOUS DEVICE, WITH PORT;  Surgeon: Emmalene Hare, MD;  Location: [facility];  Service: General;  Laterality: N/A;   ROBOT ASSISTED LAPAROSCOPIC RADICAL PROSTATECTOMY  06/07/2012   Procedure: ROBOTIC ASSISTED LAPAROSCOPIC RADICAL PROSTATECTOMY LEVEL 2;  Surgeon: Kristeen Peto, MD;  Location: [facility];  Service: Urology;  Laterality: N/A;  HEMATOLOGY/ONCOLOGY HISTORY:  Oncology History Overview Note  Melanoma of thoracic region   Primary site: Melanoma of the Skin (Right)   Staging method: AJCC 7th Edition   Clinical: Stage III (T2, N1, M0) signed by Almeda Jacobs, MD on 12/23/2013  9:55 AM   Pathologic: (T2, N1, cM0) signed by Almeda Jacobs, MD on 12/23/2013  9:56 AM   Summary:  Stage III (T2, N1, cM0)  Also history of prostate cancer T2cN0M0 status post prostatectomy.   Melanoma of thoracic region [facility]) (Resolved)  06/07/2012 Surgery   The patient underwent prostate surgery which show Gleason 3+4 involving both lobes.   10/20/2013 Procedure   The patient underwent biopsy to confirm superficial spreading melanoma, 1.82 mm thickness   11/23/2013 Surgery   The patient underwent wide local excision and sentinel lymph node biopsy which showed 1 lymph node involvement.   12/13/2013 Surgery   The patient underwent in complete lymph node dissection did show no evidence of disease.   02/20/2014 Imaging   Staging PET CT scan show no evidence of disease recurrence.   Metastatic melanoma (HCC)  02/19/2024 Initial Diagnosis   Metastatic melanoma (HCC)   02/19/2024 Cancer Staging   Staging form: Melanoma of the Skin, AJCC 8th Edition - Clinical stage from 02/19/2024: Stage IV (pM1d) - Signed by Avonne Boettcher, MD on 02/19/2024 Stage prefix: Recurrence   02/25/2024 -  Chemotherapy   Patient is on Treatment Plan : MELANOMA Nivolumab  (3) + Ipilimumab  (1) q21d / Nivolumab  (480) q28d       ALLERGIES:  is allergic to lisinopril and pollen extract.  MEDICATIONS:  Current Facility-Administered Medications  Medication Dose Route Frequency Provider Last Rate Last Admin   0.9 %  sodium chloride  infusion   Intravenous Continuous [place], MD 125 mL/hr at 02/29/24 1120 New Bag at 02/29/24 1120   acetaminophen  (TYLENOL ) 160 MG/5ML solution 960 mg  960 mg Per Tube Q6H PRN [place], MD       dorzolamide -timolol  (COSOPT ) 2-0.5 % ophthalmic solution 1 drop  1 drop Both Eyes BID Antoniette Batty T, MD   1 drop at 02/29/24 1119   enoxaparin  (LOVENOX ) injection 40 mg  40 mg Subcutaneous Q24H Antoniette Batty T, MD       haloperidol  lactate (HALDOL ) injection 2 mg  2 mg Intravenous Q6H PRN Antoniette Batty T, MD       insulin  aspart (novoLOG ) injection 0-15 Units  0-15 Units Subcutaneous TID WC Zhang,  Ping T, MD   3 Units at 02/29/24 1121   latanoprost  (XALATAN ) 0.005 % ophthalmic solution 1 drop  1 drop Both Eyes BID Antoniette Batty T, MD       ondansetron  (ZOFRAN ) tablet 8 mg  8 mg Per Tube Q8H PRN Antoniette Batty T, MD       oxyCODONE  (Oxy IR/ROXICODONE ) immediate release tablet 5 mg  5 mg Per Tube Q4H PRN Antoniette Batty T, MD       pantoprazole  (PROTONIX ) injection 40 mg  40 mg Intravenous Q24H Antoniette Batty T, MD   40 mg at 02/29/24 1119   piperacillin -tazobactam (ZOSYN ) IVPB 3.375 g  3.375 g Intravenous Q8H Hunt, Madison H, RPH 12.5 mL/hr at 02/29/24 1330 3.375 g at 02/29/24 1330   potassium chloride  10 mEq in 100 mL IVPB  10 mEq Intravenous Q1 Hr x 2 Antoniette Batty T, MD 100 mL/hr at 02/29/24 1448 10 mEq at 02/29/24 1448   Current Outpatient Medications  Medication Sig Dispense Refill   dorzolamide -timolol  (COSOPT )  22.3-6.8 MG/ML ophthalmic solution Place 1 drop into both eyes 2 (two) times daily.     latanoprost  (XALATAN ) 0.005 % ophthalmic solution Place 1 drop into both eyes 2 (two) times daily.     losartan -hydrochlorothiazide (HYZAAR) 50-12.5 MG tablet TAKE 1 TABLET BY MOUTH EVERY DAY 90 tablet 1   metFORMIN  (GLUCOPHAGE -XR) 500 MG 24 hr tablet TAKE 1 TABLET BY MOUTH EVERY DAY WITH BREAKFAST 100 tablet 3   Multiple Vitamin (MULTI-VITAMIN) tablet Take 1 tablet by mouth daily.     ondansetron  (ZOFRAN ) 8 MG tablet Take 1 tablet (8 mg total) by mouth every 8 (eight) hours as needed for nausea or vomiting. 30 tablet 1   oxyCODONE  (ROXICODONE ) 5 MG/5ML solution Take 5 mLs (5 mg total) by mouth every 4 (four) hours as needed for severe pain (pain score 7-10). 150 mL 0   prochlorperazine  (COMPAZINE ) 10 MG tablet Take 1 tablet (10 mg total) by mouth every 6 (six) hours as needed for nausea or vomiting. 30 tablet 1   acetaminophen  (TYLENOL ) 160 MG/5ML solution Take 30 mLs (960 mg total) by mouth every 6 (six) hours as needed for mild pain (pain score 1-3).     benzonatate  (TESSALON ) 200 MG capsule Take 1  capsule (200 mg total) by mouth 3 (three) times daily as needed for cough. (Patient not taking: Reported on 02/29/2024) 60 capsule 1   cyanocobalamin 1000 MCG tablet Take 1 tablet by mouth every other day. 250 MG     ibuprofen  (ADVIL ) 100 MG/5ML suspension Take 30 mLs (600 mg total) by mouth every 8 (eight) hours as needed for moderate pain (pain score 4-6). 473 mL 1   lidocaine -prilocaine  (EMLA ) cream Apply to affected area once 30 g 3   Nutritional Supplements (NUTREN 1.5) LIQD Give 1 carton 6 times a day in feeding tube via bolus syringe.  Flush with 60ml of water  before and of water  after each feeding.   Provide equivalent formula if current formula not available. 1500 mL 3   omeprazole  (PRILOSEC  OTC) 20 MG tablet Take 20 mg by mouth as needed.      rosuvastatin  (CRESTOR ) 10 MG tablet Take 1 tablet (10 mg total) by mouth 2 (two) times a week. 26 tablet 3   tadalafil  (CIALIS ) 20 MG tablet Take 0.5-1 tablets (10-20 mg total) by mouth every other day as needed for erectile dysfunction. (Patient not taking: Reported on 02/23/2024) 10 tablet 11   zinc gluconate 50 MG tablet Take 50 mg by mouth daily.      VITAL SIGNS: BP (!) 174/117   Pulse 99   Temp 98 F (36.7 C) (Axillary)   Resp 13   Ht 5\' 6"  (1.676 m)   Wt 170 lb 8.4 oz (77.3 kg)   SpO2 92%   BMI 27.52 kg/m  Filed Weights   02/29/24 0553  Weight: 170 lb 8.4 oz (77.3 kg)    Estimated body mass index is 27.52 kg/m as calculated from the following:   Height as of this encounter: 5\' 6"  (1.676 m).   Weight as of this encounter: 170 lb 8.4 oz (77.3 kg).  LABS: CBC:    Component Value Date/Time   WBC 34.3 (H) 02/29/2024 0557   HGB 13.9 02/29/2024 0557   HGB 12.5 (L) 02/26/2024 1305   HGB 15.5 09/26/2014 0832   HCT 38.2 (L) 02/29/2024 0557   HCT 43.2 09/26/2014 0832   PLT 372 02/29/2024 0557   PLT 296 02/26/2024 1305   PLT 223 09/26/2014  0832   MCV 85.1 02/29/2024 0557   MCV 86.6 09/26/2014 0832   NEUTROABS 31.0 (H)  02/29/2024 0557   NEUTROABS 5.4 09/26/2014 0832   LYMPHSABS 0.8 02/29/2024 0557   LYMPHSABS 1.0 09/26/2014 0832   MONOABS 1.9 (H) 02/29/2024 0557   MONOABS 0.5 09/26/2014 0832   EOSABS 0.2 02/29/2024 0557   EOSABS 0.2 09/26/2014 0832   BASOSABS 0.1 02/29/2024 0557   BASOSABS 0.0 09/26/2014 0832   Comprehensive Metabolic Panel:    Component Value Date/Time   NA 134 (L) 02/29/2024 0557   NA 138 02/03/2020 0000   NA 139 09/26/2014 0832   K 3.2 (L) 02/29/2024 0557   K 3.9 09/26/2014 0832   CL 95 (L) 02/29/2024 0557   CO2 27 02/29/2024 0557   CO2 22 09/26/2014 0832   BUN 17 02/29/2024 0557   BUN 12 02/03/2020 0000   BUN 13.3 09/26/2014 0832   CREATININE 0.69 02/29/2024 0557   CREATININE 0.70 02/26/2024 1304   CREATININE 1.0 09/26/2014 0832   GLUCOSE 195 (H) 02/29/2024 0557   GLUCOSE 197 (H) 09/26/2014 0832   CALCIUM  9.2 02/29/2024 0557   CALCIUM  9.0 09/26/2014 0832   AST 18 02/29/2024 0557   AST 19 02/26/2024 1304   AST 19 09/26/2014 0832   ALT 16 02/29/2024 0557   ALT 14 02/26/2024 1304   ALT 28 09/26/2014 0832   ALKPHOS 76 02/29/2024 0557   ALKPHOS 69 09/26/2014 0832   BILITOT 2.1 (H) 02/29/2024 0557   BILITOT 1.1 02/26/2024 1304   BILITOT 0.92 09/26/2014 0832   PROT 7.4 02/29/2024 0557   PROT 6.5 09/26/2014 0832   ALBUMIN 3.7 02/29/2024 0557   ALBUMIN 3.8 09/26/2014 0832    RADIOGRAPHIC STUDIES: CT SUPER D CHEST WO CONTRAST Result Date: 02/29/2024 CLINICAL DATA:  Right lower lobe lung mass.  Pre bronchoscopy. EXAM: CT CHEST WITHOUT CONTRAST TECHNIQUE: Multidetector CT imaging of the chest was performed using thin slice collimation for electromagnetic bronchoscopy planning purposes, without intravenous contrast. RADIATION DOSE REDUCTION: This exam was performed according to the departmental dose-optimization program which includes automated exposure control, adjustment of the mA and/or kV according to patient size and/or use of iterative reconstruction technique.  COMPARISON:  PET 02/04/2024 and CT chest 02/01/2024. FINDINGS: Cardiovascular: Atherosclerotic calcification of the aorta, aortic valve and coronary arteries. Heart is at the upper limits of normal in size. No pericardial effusion. Mediastinum/Nodes: No pathologically enlarged mediastinal or axillary lymph nodes. Hilar regions are difficult to definitively evaluate without IV contrast. Subcarinal lymph node measures 11 mm, previously 14 mm. Surgical clips in the right axilla. Air in the esophagus can be seen with dysmotility. Lungs/Pleura: Calcified granulomas. Right lower lobe mass measures 5.8 x 6.9 cm, stable. Patchy peribronchovascular ground-glass and nodularity in the surrounding right middle and right lower lobes. Additional peribronchovascular ground-glass in the lingula and left lower lobe. No pleural fluid. Airway is unremarkable. Upper Abdomen: Liver margin is slightly irregular. Visualized portions of the liver, gallbladder, adrenal glands, kidneys, spleen, pancreas, stomach and bowel are otherwise grossly unremarkable. No upper abdominal adenopathy. Musculoskeletal: Degenerative changes in the spine. No worrisome lytic or sclerotic lesions. IMPRESSION: 1. Stable right lower lobe mass, consistent with biopsy-proven metastatic melanoma. 2. Patchy peribronchovascular ground-glass and nodularity in the lung bases, right greater than left, of indeterminate etiology but possibly treatment related. 3. Liver appears mildly cirrhotic. 4. Aortic atherosclerosis (ICD10-I70.0). Coronary artery calcification. Electronically Signed   By: Shearon Denis M.D.   On: 02/29/2024 14:23   CT Angio  Chest PE W and/or Wo Contrast Result Date: 02/29/2024 CLINICAL DATA:  Known right lower lobe mass/neoplasm, PET positive, presenting with increased weakness and hypoxia with increased work of breathing. The patient had an open gastrostomy creation 02/23/2024. EXAM: CT ANGIOGRAPHY CHEST CT ABDOMEN AND PELVIS WITH CONTRAST  TECHNIQUE: Multidetector CT imaging of the chest was performed using the standard protocol during bolus administration of intravenous contrast. Multiplanar CT image reconstructions and MIPs were obtained to evaluate the vascular anatomy. Multidetector CT imaging of the abdomen and pelvis was performed using the standard protocol during bolus administration of intravenous contrast. RADIATION DOSE REDUCTION: This exam was performed according to the departmental dose-optimization program which includes automated exposure control, adjustment of the mA and/or kV according to patient size and/or use of iterative reconstruction technique. CONTRAST:  OMNIPAQUE  IOHEXOL  350 MG/ML SOLN COMPARISON:  Chest CT without contrast 02/15/2024, PET-CT 02/04/2024, and a portable chest from 02/23/2024 and today. FINDINGS: CTA CHEST FINDINGS Cardiovascular: There is a new small anterior pericardial effusion. The cardiac size is normal. There are left main and 2 vessel coronary calcifications in the LAD and right coronary artery, heaviest in the LAD. There is a new left chest implanted port, with subclavian approach catheter terminating at about the brachiocephalic/SVC junction. There is atherosclerosis in the aorta and great vessels without aneurysm, dissection or stenosis. No pulmonary arterial dilatation or embolism is seen. Pulmonary veins are normal caliber. There is bronchovascular mass encasement and narrowing along the lower hilar area in the lower lobe. Mediastinum/Nodes: There are increasingly prominent lymph nodes in the azygoesophageal recess, largest 1.5 x 2.5 cm on 2:90. There are increasingly prominent right lower hilar lymph nodes difficult to separate out from the right lower lobe mass. Largest of these is estimated 1.9 cm short axis on 2:91. There is an enlarging right mid hilar node of 1.5 cm on 2: 76, additional subcarinal lymph nodes up to 1 cm. I do not see further adenopathy. Lungs/Pleura: There is moderate  fluid consistent with aspiration in the trachea, tracking down into right middle and lower lobe bronchi. Fluid is also seen in the right upper lobe main bronchus and there is diffuse bronchial thickening. There are multiple subsegmental and a few segmental fluid opacified bronchi in the right lower lobe basal segments with the right lower lobe distal main bronchus narrowed by the tumor. There is scattered subsegmental bronchial impactions in the right middle lobe. Right lower lobe mass abutting the hilum is centered in the superior segment and measures 7 x 5.6 cm on 2:10, not larger than on 02/15/2024 but on 02/04/2024 measured 6.5 x 5.5 cm. There is patchy ground-glass airspace disease in the left upper lobe, patchy denser ground-glass intermixed with peribronchovascular nodular airspace disease in the right upper lobe, ill-defined scattered hazy airspace disease in left lower lobe. In the right lower lobe, in addition to a fluid impactions in multiple subsegmental and downstream bronchi, there is patchy bronchopneumonia greatest in the superior segment, with patchy tree-in-bud and ground-glass airspace disease in the right middle lobe. Findings consistent with multilobar pneumonia. No fluid is seen in the left-sided bronchi. There are no pleural effusions. Musculoskeletal: Degenerative change thoracic spine. No regional bone metastasis. At T8-9 and T9-10 there small left paracentral calcified disc extrusions without overt mass effect. Review of the MIP images confirms the above findings. CT ABDOMEN and PELVIS FINDINGS Hepatobiliary: No liver metastasis is seen. Unremarkable gallbladder and bile ducts. Pancreas: No abnormality. Spleen: No abnormality. Adrenals/Urinary Tract: No adrenal renal mass enhancement.  There is a 2 cm right lower pole parapelvic Bosniak 2 cyst with a Hounsfield density of 27, not requiring follow-up imaging. There is a parapelvic cyst in the lower left kidney which is too small to  characterize consistent with another Bosniak 2 cyst. There is a 3 mm caliceal stone in the midpole of the left kidney. No other urolithiasis is seen and no obstructing stones. Normal bladder. Stomach/Bowel: There is a PEG tube in place. The balloon is in the stomach. There is moderate free air in the upper abdomen, more than expected 6 days out from PEG placement. A gastric leak is not excluded but there is no free fluid. Furthermore no focal inflammatory changes seen around the where the tube enters the stomach. Clinical correlation follow-up recommended. No bowel dilatation or wall thickening is seen. The appendix is normal. There is sigmoid diverticulosis, without diverticulitis. Vascular/Lymphatic: Aortic atherosclerosis. No enlarged abdominal or pelvic lymph nodes. Reproductive: The prostate appears to have been removed. No mass is seen in the prostate bed. There are no surgical clips in the pelvic sidewalls. Other: None. Musculoskeletal: There is no focally destructive lesion. The PET-CT demonstrated abnormal activity in the posterior superior facet of the left greater trochanter concerning for metastatic disease, but which does not have a CT correlate. There is degenerative change of the lumbar spine most advanced at the lowest 2 levels where there is acquired spinal stenosis. 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 Review of the MIP images confirms the above findings. IMPRESSION: 1. No evidence of pulmonary arterial dilatation or embolus. 2. 7 x 5.6 cm right lower lobe mass centered in the superior segment, not larger than on 02/15/2024 but slightly increased since 02/04/2024. 3. Aspiration fluid in the trachea, right middle and lower lobe bronchi, some in the right upper lobe main bronchus, with diffuse bronchial thickening and with multilobar pneumonia described in detail above, right greater than left. 4. Increasingly prominent mediastinal and right hilar lymph nodes. 5. New small anterior  pericardial effusion. 6. Aortic and coronary artery atherosclerosis. 7. PEG tube in place with the balloon in the stomach. There is moderate free air in the upper abdomen more than expected 6 days out from PEG placement. A gastric leak is not excluded but there is no free fluid. Furthermore no focal inflammatory changes seen around the where the tube enters the stomach. Clinical correlation and follow-up recommended. 8. No evidence of metastatic disease in the abdomen or pelvis. 9. 3 mm nonobstructing left renal stone. 10. Diverticulosis without evidence of diverticulitis. 11. The PET-CT demonstrated abnormal activity in the posterior superior facet of the left greater trochanter concerning for metastatic disease, but which does not have a CT correlate. 12. Critical Value/emergent results were called by telephone at the time of interpretation on 02/29/2024 at 7:36 am to provider DR. Cleora Daft, who verbally acknowledged these results. Aortic Atherosclerosis (ICD10-I70.0). Electronically Signed   By: Denman Fischer M.D.   On: 02/29/2024 08:11   CT ABDOMEN PELVIS W CONTRAST Result Date: 02/29/2024 CLINICAL DATA:  Known right lower lobe mass/neoplasm, PET positive, presenting with increased weakness and hypoxia with increased work of breathing. The patient had an open gastrostomy creation 02/23/2024. EXAM: CT ANGIOGRAPHY CHEST CT ABDOMEN AND PELVIS WITH CONTRAST TECHNIQUE: Multidetector CT imaging of the chest was performed using the standard protocol during bolus administration of intravenous contrast. Multiplanar CT image reconstructions and MIPs were obtained to evaluate the vascular anatomy. Multidetector CT imaging of the abdomen and pelvis was performed using the  standard protocol during bolus administration of intravenous contrast. RADIATION DOSE REDUCTION: This exam was performed according to the departmental dose-optimization program which includes automated exposure control, adjustment of the mA and/or kV  according to patient size and/or use of iterative reconstruction technique. CONTRAST:  OMNIPAQUE  IOHEXOL  350 MG/ML SOLN COMPARISON:  Chest CT without contrast 02/15/2024, PET-CT 02/04/2024, and a portable chest from 02/23/2024 and today. FINDINGS: CTA CHEST FINDINGS Cardiovascular: There is a new small anterior pericardial effusion. The cardiac size is normal. There are left main and 2 vessel coronary calcifications in the LAD and right coronary artery, heaviest in the LAD. There is a new left chest implanted port, with subclavian approach catheter terminating at about the brachiocephalic/SVC junction. There is atherosclerosis in the aorta and great vessels without aneurysm, dissection or stenosis. No pulmonary arterial dilatation or embolism is seen. Pulmonary veins are normal caliber. There is bronchovascular mass encasement and narrowing along the lower hilar area in the lower lobe. Mediastinum/Nodes: There are increasingly prominent lymph nodes in the azygoesophageal recess, largest 1.5 x 2.5 cm on 2:90. There are increasingly prominent right lower hilar lymph nodes difficult to separate out from the right lower lobe mass. Largest of these is estimated 1.9 cm short axis on 2:91. There is an enlarging right mid hilar node of 1.5 cm on 2: 76, additional subcarinal lymph nodes up to 1 cm. I do not see further adenopathy. Lungs/Pleura: There is moderate fluid consistent with aspiration in the trachea, tracking down into right middle and lower lobe bronchi. Fluid is also seen in the right upper lobe main bronchus and there is diffuse bronchial thickening. There are multiple subsegmental and a few segmental fluid opacified bronchi in the right lower lobe basal segments with the right lower lobe distal main bronchus narrowed by the tumor. There is scattered subsegmental bronchial impactions in the right middle lobe. Right lower lobe mass abutting the hilum is centered in the superior segment and measures 7 x 5.6  cm on 2:10, not larger than on 02/15/2024 but on 02/04/2024 measured 6.5 x 5.5 cm. There is patchy ground-glass airspace disease in the left upper lobe, patchy denser ground-glass intermixed with peribronchovascular nodular airspace disease in the right upper lobe, ill-defined scattered hazy airspace disease in left lower lobe. In the right lower lobe, in addition to a fluid impactions in multiple subsegmental and downstream bronchi, there is patchy bronchopneumonia greatest in the superior segment, with patchy tree-in-bud and ground-glass airspace disease in the right middle lobe. Findings consistent with multilobar pneumonia. No fluid is seen in the left-sided bronchi. There are no pleural effusions. Musculoskeletal: Degenerative change thoracic spine. No regional bone metastasis. At T8-9 and T9-10 there small left paracentral calcified disc extrusions without overt mass effect. Review of the MIP images confirms the above findings. CT ABDOMEN and PELVIS FINDINGS Hepatobiliary: No liver metastasis is seen. Unremarkable gallbladder and bile ducts. Pancreas: No abnormality. Spleen: No abnormality. Adrenals/Urinary Tract: No adrenal renal mass enhancement. There is a 2 cm right lower pole parapelvic Bosniak 2 cyst with a Hounsfield density of 27, not requiring follow-up imaging. There is a parapelvic cyst in the lower left kidney which is too small to characterize consistent with another Bosniak 2 cyst. There is a 3 mm caliceal stone in the midpole of the left kidney. No other urolithiasis is seen and no obstructing stones. Normal bladder. Stomach/Bowel: There is a PEG tube in place. The balloon is in the stomach. There is moderate free air in the upper abdomen,  more than expected 6 days out from PEG placement. A gastric leak is not excluded but there is no free fluid. Furthermore no focal inflammatory changes seen around the where the tube enters the stomach. Clinical correlation follow-up recommended. No bowel  dilatation or wall thickening is seen. The appendix is normal. There is sigmoid diverticulosis, without diverticulitis. Vascular/Lymphatic: Aortic atherosclerosis. No enlarged abdominal or pelvic lymph nodes. Reproductive: The prostate appears to have been removed. No mass is seen in the prostate bed. There are no surgical clips in the pelvic sidewalls. Other: None. Musculoskeletal: There is no focally destructive lesion. The PET-CT demonstrated abnormal activity in the posterior superior facet of the left greater trochanter concerning for metastatic disease, but which does not have a CT correlate. There is degenerative change of the lumbar spine most advanced at the lowest 2 levels where there is acquired spinal stenosis. 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 Review of the MIP images confirms the above findings. IMPRESSION: 1. No evidence of pulmonary arterial dilatation or embolus. 2. 7 x 5.6 cm right lower lobe mass centered in the superior segment, not larger than on 02/15/2024 but slightly increased since 02/04/2024. 3. Aspiration fluid in the trachea, right middle and lower lobe bronchi, some in the right upper lobe main bronchus, with diffuse bronchial thickening and with multilobar pneumonia described in detail above, right greater than left. 4. Increasingly prominent mediastinal and right hilar lymph nodes. 5. New small anterior pericardial effusion. 6. Aortic and coronary artery atherosclerosis. 7. PEG tube in place with the balloon in the stomach. There is moderate free air in the upper abdomen more than expected 6 days out from PEG placement. A gastric leak is not excluded but there is no free fluid. Furthermore no focal inflammatory changes seen around the where the tube enters the stomach. Clinical correlation and follow-up recommended. 8. No evidence of metastatic disease in the abdomen or pelvis. 9. 3 mm nonobstructing left renal stone. 10. Diverticulosis without evidence of  diverticulitis. 11. The PET-CT demonstrated abnormal activity in the posterior superior facet of the left greater trochanter concerning for metastatic disease, but which does not have a CT correlate. 12. Critical Value/emergent results were called by telephone at the time of interpretation on 02/29/2024 at 7:36 am to provider DR. Cleora Daft, who verbally acknowledged these results. Aortic Atherosclerosis (ICD10-I70.0). Electronically Signed   By: Denman Fischer M.D.   On: 02/29/2024 08:11   DG Chest Port 1 View Result Date: 02/29/2024 CLINICAL DATA:  Increased shortness of breath. EXAM: PORTABLE CHEST 1 VIEW COMPARISON:  02/23/2024 FINDINGS: There is a left chest wall porta catheter with tip in the projection of the SVC. Heart size and mediastinal contours are unremarkable. Right lower lobe lung mass is again noted compatible with malignancy. No superimposed pleural effusion, edema or airspace consolidation. Interval decrease in pneumoperitoneum compared with the previous exam. Visualized osseous structures appear intact. IMPRESSION: 1. No acute cardiopulmonary abnormalities. 2. Right lower lobe lung mass compatible with malignancy. 3. Interval decrease in pneumoperitoneum compared with the previous exam. Electronically Signed   By: Kimberley Penman M.D.   On: 02/29/2024 06:23   DG [facility]ICARE SPEECH PATH Result Date: 02/25/2024 CLINICAL DATA:  73 year old male with history of CVA, with esophageal dysphagia. EXAM: MODIFIED BARIUM SWALLOW TECHNIQUE: Different consistencies of barium were administered orally to the patient by the Speech Pathologist. Imaging of the pharynx was performed in the lateral projection. Charles care, PA-C was present in the fluoroscopy room during this  study, which was supervised and interpreted by Dr. Myrlene Asper, MD. FLUOROSCOPY: Radiation Exposure Index (as provided by the fluoroscopic device): 12.30 mGy Kerma COMPARISON:  None Available. FINDINGS: Vestibular  Penetration:   Noted with thin liquids Aspiration:  Noted with thin liquids Other: Patient experienced difficulty swallowing soft foods as well, with ineffective swallow reflex. Early spillage. IMPRESSION: Vestibular penetration with aspiration noted with thin liquids. Poor swallowing reflex noted with soft foods. Please refer to the Speech Pathologists report for complete details and recommendations. Electronically Signed   By: Myrlene Asper D.O.   On: 02/25/2024 15:05   DG Chest Port 1 View Result Date: 02/23/2024 CLINICAL DATA:  Port-A-Cath placement. EXAM: PORTABLE CHEST 1 VIEW COMPARISON:  Feb 16, 2024. FINDINGS: The heart size and mediastinal contours are within normal limits. Left subclavian Port-A-Cath is noted with distal tip in expected position of the SVC. No pneumothorax is noted. Stable large right lower lobe mass is noted concerning for malignancy. Free air is noted under both hemidiaphragms consistent with gastrostomy placement today. The visualized skeletal structures are unremarkable. IMPRESSION: Left subclavian Port-A-Cath is noted with distal tip in expected position of the SVC. Stable right lower lobe mass is noted consistent with malignancy. Electronically Signed   By: Rosalene Colon M.D.   On: 02/23/2024 12:16   DG C-Arm 1-60 Min-No Report Result Date: 02/23/2024 Fluoroscopy was utilized by the requesting physician.  No radiographic interpretation.   DG Chest Port 1 View Result Date: 02/16/2024 CLINICAL DATA:  Status post bronchoscopy, right lower lobe mass EXAM: PORTABLE CHEST 1 VIEW COMPARISON:  02/01/2024 FINDINGS: 7.2 cm right lower lobe mass is again observed. No pneumothorax or pneumomediastinum. Low lung volumes. Thoracic spondylosis. Heart size within normal limits. IMPRESSION: 1. No pneumothorax or pneumomediastinum. 2. 7.2 cm right lower lobe mass. 3. Low lung volumes. Electronically Signed   By: Freida Jes M.D.   On: 02/16/2024 11:58   DG C-Arm 1-60 Min-No Report Result Date:  02/16/2024 Fluoroscopy was utilized by the requesting physician.  No radiographic interpretation.   MR BRAIN W WO CONTRAST Result Date: 02/09/2024 CLINICAL DATA:  Initial metastatic disease evaluation. EXAM: MRI HEAD WITHOUT AND WITH CONTRAST TECHNIQUE: Multiplanar, multiecho pulse sequences of the brain and surrounding structures were obtained without and with intravenous contrast. CONTRAST:  8mL GADAVIST  GADOBUTROL  1 MMOL/ML IV SOLN COMPARISON:  Comparison made with prior MRIs from 02/01/2024 and 02/05/2024. FINDINGS: Brain: Cerebral volume within normal limits. No significant cerebral white matter disease for age. No evidence for acute or subacute infarct. No areas of chronic cortical infarction. No acute or significant chronic intracranial blood products. Abnormal nodular thickening and enhancement seen about multiple cranial nerves at the skull base. Changes are most pronounced about the nerve root entry zones of the trigeminal nerves bilaterally (series 18, image 59). Prominent involvement of the seventh and 8 nerve cranial nerve complexes as well (series 18, image 48). Involvement of the hypoglossal nerves. Subtle nodular left a meningeal enhancement seen about the cerebellum, most pronounced at the vermis (series 18, images 75, 68, 61). Thickening and enhancement about the pituitary stalk (series 18, image 73). 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 Mild associated edema within the brain parenchyma itself of the pons left middle cerebellar peduncle (series 11, image 18). No other significant mass effect or associated edema. No other mass lesion or abnormal enhancement. Ventricles normal size without hydrocephalus. No extra-axial fluid collections. No other abnormal enhancement. Vascular: Major intracranial vascular flow voids are maintained. Skull and upper cervical  spine: Craniocervical junction within normal limits. Bone marrow signal intensity within normal limits. No  focal marrow replacing lesion. No scalp soft tissue abnormality. Sinuses/Orbits: Prior ocular lens replacement on the left. Questionable focus of asymmetric enhancement at the posterior aspect of the left lobe near the optic nerve insertion (series 18, image 63). Orbital soft tissues otherwise unremarkable. Paranasal sinuses are largely clear. No significant mastoid effusion. Other: None. IMPRESSION: 1. Abnormal nodular thickening and enhancement about multiple cranial nerves at the skull base and pituitary stalk, with additional subtle leptomeningeal enhancement about the cerebellum. 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 2. Mild associated edema within the brain parenchyma of the pons/left middle cerebellar peduncle. 3. Questionable focus of asymmetric enhancement at the posterior aspect of the left globe near the optic nerve insertion. Correlation with fundoscopic examination suggested. Additionally, attention at follow-up recommended. 4. No other acute intracranial abnormality. Electronically Signed   By: Virgia Griffins M.D.   On: 02/09/2024 20:52   MR THORACIC SPINE W WO CONTRAST Result Date: 02/09/2024 CLINICAL DATA:  Initial metastatic disease evaluation. EXAM: MRI THORACIC WITHOUT AND WITH CONTRAST TECHNIQUE: Multiplanar and multiecho pulse sequences of the thoracic spine were obtained without and with intravenous contrast. CONTRAST:  8mL GADAVIST  GADOBUTROL  1 MMOL/ML IV SOLN COMPARISON:  Prior MRI from 02/05/2024. FINDINGS: Alignment: Trace scoliosis. Alignment otherwise normal with preservation of the normal thoracic kyphosis. No listhesis. Vertebrae: Vertebral body height maintained without acute or chronic fracture. Bone marrow signal intensity within normal limits. Benign hemangioma noted within the T3 vertebral body. No evidence for osseous metastatic disease. Degenerative reactive endplate change with marrow edema present  about the T12-L1 interspace. No other abnormal marrow edema or enhancement. Cord: Few small nodular foci of enhancement seen about the visualized distal conus/cauda equina at the level of L1, concerning for leptomeningeal disease as seen on prior MRI of the lumbar spine (series 24, images 8, 9, 10). No other evidence for intracanalicular metastatic disease within the thoracic spine. Cord itself demonstrates normal signal and morphology. Paraspinal and other soft tissues: Paraspinous soft tissues within normal limits. Patient's known right lung mass partially visualized. Disc levels: T1-2: Negative interspace.  Mild facet hypertrophy.  No stenosis. T2-3: Minimal disc bulge. Mild facet hypertrophy. No spinal stenosis. Mild bilateral foraminal narrowing. T3-4:  Negative interspace.  Mild facet hypertrophy.  No stenosis. T4-5: Negative interspace. Mild facet hypertrophy. No spinal stenosis. Mild right foraminal narrowing. T5-6: Negative interspace. Mild facet hypertrophy. No significant stenosis. T6-7: Left paracentral disc protrusion indents the left ventral thecal sac. Mild facet hypertrophy. No stenosis. T7-8: Left paracentral disc extrusion with inferior migration indents the ventral thecal sac. Flattening of the left ventral cord without cord signal changes or significant spinal stenosis. Foramina remain patent. T8-9: Left paracentral disc extrusion with possible superior migration. Flattening of the left ventral cord without cord signal changes or significant spinal stenosis. Foramina remain patent. T9-10: Left paracentral disc extrusion with superior migration. Flattening of the left ventral cord without cord signal changes or significant spinal stenosis. Mild bilateral facet hypertrophy with resultant mild right foraminal stenosis. Left neural foramina remains adequately patent. T10-11: Minimal disc bulge. Mild facet hypertrophy. No spinal stenosis. Mild right with moderate left foraminal stenosis. T11-12:  Diffuse disc bulge with endplate spurring. Mild facet hypertrophy. No significant stenosis. T12-L1: Degenerative disc space narrowing diffuse disc bulge and disc desiccation. Reactive endplate spurring. Moderate facet hypertrophy. Resultant mild-to-moderate spinal stenosis with moderate bilateral foraminal narrowing. IMPRESSION: 1. Few small nodular foci  of enhancement about the visualized distal conus/cauda equina at the level of L1, concerning for leptomeningeal disease as seen on prior MRI of the lumbar spine. No other evidence for intracanalicular or osseous metastatic disease within the thoracic spine. 2. Solid right lung mass, partially visualized, and better characterized on recent chest CT. 3. 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 4. Multifactorial degenerative changes with resultant multilevel foraminal narrowing as above. Notable findings include mild right foraminal stenosis at T4-5 and T9-10, with moderate left foraminal narrowing at T10-11. Electronically Signed   By: Virgia Griffins M.D.   On: 02/09/2024 18:31   MR CERVICAL SPINE W WO CONTRAST Result Date: 02/09/2024 CLINICAL DATA:  Initial evaluation for metastatic disease evaluation. EXAM: MRI CERVICAL SPINE WITHOUT AND WITH CONTRAST TECHNIQUE: Multiplanar and multiecho pulse sequences of the cervical spine, to include the craniocervical junction and cervicothoracic junction, were obtained without and with intravenous contrast. CONTRAST:  8mL GADAVIST  GADOBUTROL  1 MMOL/ML IV SOLN COMPARISON:  Prior MRI from 02/05/2024. FINDINGS: Alignment: Mild dextroscoliosis with straightening of the normal cervical lordosis. Trace degenerative anterolisthesis of C2 on C3 and C3 on C4. Vertebrae: Vertebral body height maintained without acute or chronic fracture. Bone marrow signal intensity within normal limits. Small benign hemangioma noted within the T3 vertebral body. No evidence for osseous  metastatic disease. No abnormal marrow edema or enhancement. Cord: Normal signal and morphology. No evidence for intracanalicular metastatic disease within the cervical spine. Posterior Fossa, vertebral arteries, paraspinal tissues: Abnormal thickening and enhancement seen about multiple cranial nerves at the visualized skull base (series 12, images 6, 15). 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 Degenerative thickening noted about the tectorial membrane without significant stenosis at the craniocervical junction. Paraspinous soft tissues within normal limits. Normal flow voids seen within the vertebral arteries bilaterally. Disc levels: C2-C3: Trace anterolisthesis. Mild uncovertebral spurring without significant disc bulge. Moderate left with mild right facet hypertrophy. No significant spinal stenosis. Mild left C3 foraminal narrowing. Right neural foramina remains patent. C3-C4: Trace anterolisthesis. Small left paracentral disc protrusion mildly indents the ventral thecal sac. Left greater than right uncovertebral and facet hypertrophy. Mild spinal stenosis. Severe left with mild right C4 foraminal narrowing. C4-C5: Disc bulge with moderate right with mild left C5 foraminal stenosis. C5-C6: Degenerative disc space narrowing with diffuse disc osteophyte complex. Flattening and partial effacement of the ventral thecal sac. Superimposed facet hypertrophy. Moderate spinal stenosis with severe right worse than left C6 foraminal narrowing. C6-C7: Degenerative disc space narrowing with diffuse disc osteophyte complex. Flattening and partial effacement of the ventral thecal sac. Superimposed mild facet hypertrophy. Moderate spinal stenosis with severe bilateral C7 foraminal narrowing. C7-T1: Tiny left paracentral disc protrusion. Mild facet hypertrophy. No spinal stenosis. Foramina remain adequately patent. IMPRESSION: 1. 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 2. No other evidence for osseous or intracanalicular metastatic disease within the cervical spine. 3. 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 4. Multifactorial degenerative changes with resultant multilevel foraminal narrowing as above. Notable findings include severe left C4 foraminal stenosis, moderate right C5 foraminal narrowing, severe bilateral C6 and C7 foraminal stenosis, with mild left C3 foraminal narrowing. Electronically Signed   By: Virgia Griffins M.D.   On: 02/09/2024 18:19   MR Lumbar Spine W Wo Contrast Result Date: 02/05/2024 CLINICAL DATA:  Lung mass. Prostate cancer. Melanoma.  Abnormal PET scan. EXAM: MRI LUMBAR SPINE WITHOUT AND WITH CONTRAST TECHNIQUE: Multiplanar and multiecho pulse sequences of the lumbar spine were obtained without and with intravenous contrast. CONTRAST:  7.5mL GADAVIST  GADOBUTROL  1 MMOL/ML IV SOLN COMPARISON:  Whole body PET scan 02/04/2024 FINDINGS: Segmentation: 5 non rib-bearing lumbar type vertebral bodies are present. The lowest fully formed vertebral body is L5. Alignment: Slight retrolisthesis present at L1-2 and L2-3. Grade 1 anterolisthesis is present at L5-S1. Straightening of the normal lumbar lordosis is present. Levoconvex curvature is centered at L4-5. Vertebrae: Mixed type 1 and type 2 Modic changes are present at L3-4. Edematous changes are present on the right. More chronic type 2 Modic changes are present posteriorly on the right at L5-S1 and posteriorly on the right at L2-3. Edematous endplate changes are present posteriorly at L1-2. Vertebral body heights are maintained. No osseous metastases are present. Conus medullaris and cauda equina: Conus extends to the L2 level. Diffuse dural enhancement is present. Nodular enhancement  is present within the conus medullaris. A dorsal and right lateral nodule is present at L2 adjacent to the conus. Ventral nodule and posterior dural enhancement is present at the L2-3 level. A dorsal or dural nodule is present at L4. A 7 mm enhancing nodule is present laterally at the L4-5 disc level, corresponding to the traversing left L5 nerve roots. Paraspinal and other soft tissues: An 18 mm simple cyst is noted at the lower pole of the right kidney. No other solid organ lesions are present. No significant adenopathy is present. Disc levels: T12-L1: Mild disc bulging is present without focal stenosis. L1-2: A right paramedian disc protrusion is present. Mild central canal stenosis is present. Disc extends into the foramina bilaterally. Moderate bilateral foraminal stenosis is present. L2-3: A leftward disc protrusion is present. Moderate facet hypertrophy is seen bilaterally. This results an central canal stenosis with moderate left and mild right subarticular narrowing. Moderate foraminal narrowing is worse on the left. L3-4: A broad-based disc protrusion is present. Moderate facet hypertrophy is noted bilaterally. This results in moderate to severe central canal stenosis. Severe right and moderate left foraminal stenosis is present. L4-5: A broad-based disc protrusion is present. Moderate facet hypertrophy is noted bilaterally. Moderate foraminal narrowing is present bilaterally. L5-S1: Chronic loss of disc height is present. Moderate facet hypertrophy is worse on the right. Mild right subarticular narrowing is present. Moderate foraminal stenosis is worse right than left. IMPRESSION: 1. Diffuse dural enhancement with nodular enhancement within the conus medullaris. 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 2. 7 mm enhancing nodule laterally at the L4-5 disc level, corresponding to the traversing left L5 nerve roots. 3. No osseous metastases. 4. Multilevel spondylosis of the lumbar  spine as described. 5. Mild central canal stenosis at L1-2. 6. Moderate bilateral foraminal stenosis at L1-2. 7. Moderate left and mild right subarticular narrowing at L2-3. 8. Moderate to severe central canal stenosis at L3-4 with severe right and moderate left foraminal stenosis. 9. Moderate foraminal narrowing bilaterally at L4-5 and L5-S1. These results will be called to the ordering clinician or representative by the Radiologist Assistant, and communication documented in the PACS or [employer]. Electronically Signed   By: Audree Leas M.D.   On: 02/05/2024 19:41   NM PET Image Initial (PI) Skull Base To Thigh (F-18 FDG) Result Date: 02/04/2024 CLINICAL DATA:  Initial treatment strategy for lung nodule. Additional history of prostate cancer and melanoma. EXAM: NUCLEAR MEDICINE PET SKULL BASE TO THIGH TECHNIQUE: 10.31  mCi F-18 FDG was injected intravenously. Full-ring PET imaging was performed from the skull base to thigh after the radiotracer. CT data was obtained and used for attenuation correction and anatomic localization. Fasting blood glucose: 148 mg/dl COMPARISON:  PET-CT report November 2015.  Chest CT 02/01/2024 FINDINGS: Mediastinal blood pool activity: SUV max 3.1 Liver activity: SUV max 3.4 NECK: No specific abnormal radiotracer uptake seen including along lymph node chains of the submandibular, posterior triangle or internal jugular region. Near symmetric uptake of the visualized intracranial compartment. Incidental CT findings: Visualized paranasal sinuses and mastoid air cells are clear. Streak artifact related to patient's dental hardware. The parotid glands, submandibular glands and thyroid  glands unremarkable. CHEST: Large heterogeneous right lower lobe lung mass again identified corresponding to the 6.7 cm lesion seen on the recent CT scan of the chest with maximum SUV value of 21.1 worrisome for neoplasm. No additional areas of abnormal uptake along the lung parenchyma. There  is some mild uptake along the right infrahilar region which may be small nodes. The right lower lobe mass as abut the hilum these nodes have maximum SUV value of 8.2. No additional areas of abnormal uptake elsewhere in the chest including mediastinum, left hilum or axillary regions. The subcarinal nodes described on the CT scan have lower level of uptake with maximum SUV of 3.4. Incidental CT findings: Scattered vascular calcifications are seen including along the coronary arteries. No pericardial effusion. The thoracic aorta is normal course and caliber. Breathing motion. No pleural effusion or pneumothorax. Calcified tiny nodules in the left lung are again noted as on prior. ABDOMEN/PELVIS: There is physiologic distribution radiotracer along the parenchymal organs, bowel and renal collecting systems. Incidental CT findings: Nonobstructing midportion left-sided renal stone. Right-sided central renal cyst. Contracted urinary bladder. Large bowel has a normal course and caliber with scattered stool. Left-sided colonic diverticula. Normal appendix. Gallbladder is nondilated with dependent stones. Nonspecific perinephric stranding, right greater than left. SKELETON: Focal area of abnormal uptake seen along the left hip greater trochanter with faint area of sclerosis. Maximum SUV value of 8.4 worrisome for a skeletal metastasis. Is also focus of uptake with maximum SUV value of 8.4 corresponding to the central canal of the spine at the L3-4 level. Small soft tissue nodule in this location on CT image 104. This has a differential. Incidental CT findings: Scattered degenerative changes along the spine. Multilevel degenerative changes particularly with stenosis in the lumbar region. Trace listhesis. Degenerative changes as well of the shoulders and pelvis. IMPRESSION: Large hypermetabolic right lower lobe lung mass as seen on prior CT exam scanned extending to the hilum consistent with neoplasm. Uptake in the right hilum  could represent some adjacent nodes. Hypermetabolic sclerotic lesion involving the left hip greater trochanter worrisome for a skeletal metastasis. Focus area of uptake along the central canal of the spine at L3-4 left of midline is also seen. This would have a differential. An additional metastatic lesion is 1 of those differential. Recommend further workup with lumbar spine MRI with and without contrast to further delineate. Electronically Signed   By: Adrianna Horde M.D.   On: 02/04/2024 11:56   CT Chest W Contrast Result Date: 02/01/2024 CLINICAL DATA:  Respiratory illness, nondiagnostic xray Ongoing cough post COVID infection with difficulty swelling for 1 month. Right lung mass on radiographs concerning for malignancy. History of melanoma. * Tracking Code: BO * EXAM: CT CHEST WITH CONTRAST TECHNIQUE: Multidetector CT imaging of the chest was performed during intravenous contrast administration. RADIATION DOSE REDUCTION:  This exam was performed according to the departmental dose-optimization program which includes automated exposure control, adjustment of the mA and/or kV according to patient size and/or use of iterative reconstruction technique. CONTRAST:  75mL OMNIPAQUE  IOHEXOL  300 MG/ML  SOLN COMPARISON:  Chest radiographs same date. No other recent imaging. Remote PET-CT 09/05/2014. FINDINGS: Cardiovascular: No acute vascular findings. There is atherosclerosis of the aorta, great vessels and coronary arteries. There are calcifications of the aortic valve. The heart size is normal. There is no pericardial effusion. Mediastinum/Nodes: There are newly enlarged right hilar and subcarinal lymph nodes, including a 1.4 cm short axis subcarinal node on image 73/2. Right hilar lymph nodes measuring up to 1.7 x 1.0 cm on image 66/2 are partially calcified. Stable postsurgical changes in the right axilla from previous axillary node dissection. No enlarged axillary lymph nodes are identified. The thyroid  gland,  trachea and esophagus demonstrate no significant findings. Lungs/Pleura: No pleural effusion or pneumothorax. As demonstrated on earlier chest radiographs, there is a large well-circumscribed solid appearing mass in the right lower lobe which measures approximately 6.7 x 6.0 cm on image 82/4. Mild surrounding posterior obstructive pneumonitis and central airway thickening. No other suspicious pulmonary nodules are identified. There are small calcified granulomas adjacent to the left major fissure. Upper abdomen: Diffuse hepatic steatosis. No focal abnormalities identified. There is no evidence of adrenal mass. Musculoskeletal/Chest wall: There is no chest wall mass or suspicious osseous finding. Mild spondylosis. IMPRESSION: 1. Large well-circumscribed solid appearing mass in the right lower lobe with mild surrounding posterior obstructive pneumonitis and central airway thickening. Findings are not typical for pneumonia and are concerning for malignancy. Differential includes primary bronchogenic carcinoma and metastatic melanoma given the patient's history. 2. Newly enlarged right hilar and subcarinal lymph nodes, suspicious for metastatic disease. PET-CT may be helpful for further evaluation. 3. No other evidence of metastatic disease. 4. Hepatic steatosis. 5. Aortic atherosclerosis. Electronically Signed   By: Elmon Hagedorn M.D.   On: 02/01/2024 18:41   MR BRAIN WO CONTRAST Result Date: 02/01/2024 CLINICAL DATA:  Neuro deficit, acute, stroke suspected. Facial numbness and difficulty swallowing. EXAM: MRI HEAD WITHOUT CONTRAST TECHNIQUE: Multiplanar, multiecho pulse sequences of the brain and surrounding structures were obtained without intravenous contrast. COMPARISON:  Head CT 02/01/2024 FINDINGS: Brain: There is no evidence of an acute infarct, intracranial hemorrhage, mass, midline shift, or extra-axial fluid collection. Cerebral volume is normal. The ventricles are normal in size. No significant white  matter disease is seen for age. Vascular: Major intracranial vascular flow voids are preserved. Skull and upper cervical spine: Unremarkable brain marrow signal. Sinuses/Orbits: Left cataract extraction. Minimal mucosal thickening or small mucous retention cysts in the maxillary sinuses. Clear mastoid air cells. Other: None. IMPRESSION: Unremarkable appearance of the brain for age. Electronically Signed   By: Aundra Lee M.D.   On: 02/01/2024 16:15   DG Chest 2 View Result Date: 02/01/2024 CLINICAL DATA:  Ongoing cough post COVID. EXAM: CHEST - 2 VIEW COMPARISON:  PET-CT 09/05/2014 FINDINGS: Normal cardiac and mediastinal contours. There is a 7.7 x 6.6 cm mass projecting over the right lower lung. Minimal heterogeneous opacities left lung base. Surgical clips right axilla. Thoracic spine degenerative changes. IMPRESSION: 1. 7.7 cm mass projecting over the right lower lung. Possibility of malignant process not excluded. Less likely findings represent infectious process. Recommend further evaluation with chest CT. 2. Minimal heterogeneous opacities left lung base may represent atelectasis or infection. Electronically Signed   By: Jone Neither M.D.   On:  02/01/2024 12:37   CT Head Wo Contrast Result Date: 02/01/2024 CLINICAL DATA:  Neuro deficit, concern for stroke, difficulty chewing and swallowing, facial numbness for 1 month. EXAM: CT HEAD WITHOUT CONTRAST TECHNIQUE: Contiguous axial images were obtained from the base of the skull through the vertex without intravenous contrast. RADIATION DOSE REDUCTION: This exam was performed according to the departmental dose-optimization program which includes automated exposure control, adjustment of the mA and/or kV according to patient size and/or use of iterative reconstruction technique. COMPARISON:  None Available. FINDINGS: Brain: No acute intracranial hemorrhage. No CT evidence of acute infarct. No edema, mass effect, or midline shift. The basilar cisterns are  patent. Ventricles: The ventricles are normal. Vascular: Atherosclerotic calcifications of the carotid siphons. No hyperdense vessel. Skull: No acute or aggressive finding. Orbits: Left lens replacement.  Orbits are otherwise unremarkable. Sinuses: The visualized paranasal sinuses are clear. Other: Mastoid air cells are clear. IMPRESSION: No CT evidence of acute intracranial abnormality. Electronically Signed   By: Denny Flack M.D.   On: 02/01/2024 10:53    PERFORMANCE STATUS (ECOG) : 3 - Symptomatic, >50% confined to bed  Review of Systems Unless otherwise noted, a complete review of systems is negative.  Physical Exam General: Frail, ill-appearing Pulmonary: Unlabored, congested cough Extremities: no edema, no joint deformities Skin: no rashes Neurological: Weakness, confused  IMPRESSION: Patient seen in the emergency department.  He was admitted to the hospital with aspiration pneumonia after recent PEG placement.  Patient with stage IV melanoma with leptomeningeal spread.  He is on dual immunotherapy and pending evaluation with neuro-oncology.  Possible XRT has also been discussed.  I met with patient's wife and two daughters.  Family describe rapid decline over the past month.  Patient has been unable to eat/drink appreciably for weeks.  He has had progressive decline in performance status.  Patient now with an aspiration event and is being treated for pneumonia.  Family verbalized agreement with current scope of treatment and would like to give him time to see if he improves during this hospitalization.  They are also hopeful the patient will demonstrate a treatment response with immunotherapy but understand that his long-term prognosis is poor.  They are interested in discussing with radiation oncology to explore possible options for XRT.  We discussed CODE STATUS.  Family have reviewed a MOST form and are still considering options for resuscitation versus allowing patient to have a  natural death.  PLAN: - Continue current scope of treatment - Family discussing CODE STATUS - Will consult radiation oncology  Case and plan discussed with Dr. Randy Buttery   Time Total: 60 minutes  Visit consisted of counseling and education dealing with the complex and emotionally intense issues of symptom management and palliative care in the setting of serious and potentially life-threatening illness.Greater than 50%  of this time was spent counseling and coordinating care related to the above assessment and plan.  Signed by: Gerilyn Kobus, PhD, NP-C

## 2024-02-29 NOTE — ED Triage Notes (Signed)
 POV with CC of SOB and increased weakness. SpO2 84% on room air. Pts breathing heard from across room. A&Ox4 but lethargic.

## 2024-02-29 NOTE — Consult Note (Signed)
 [facility] SURGICAL CONSULTATION NOTE (initial) - cpt: [phone]   HISTORY OF PRESENT ILLNESS (HPI):  73 y.o. male well known to our service secondary to placement of open gastrostomy tube and left subclavian port-a-cath placement due to metastatic melanoma on 05/13 with Dr Mauri Sous. He presented to the ED overnight from home secondary to worsening SOB which progressed over the last 24 hours according to the wife. Patient does not provide much history. He reports abdomen is "sensitive" but does not feel overly bloated. No reported fever at home. Wife does report continued trouble with secretions and spitting up. They are utilizing the gastrostomy tube at home but report they are only doing 1/4 to 1/2 a cup at a time secondary to feeling full quickly. Work up in the ED revealed a leukocytosis to 34.3K, Hgb to 13.9, sCr - 0.69, hypokalemia to 3.2, venous lactate normal at 1.8. He did get CTA which was negative for PE there was concern for aspiration. He also underwent CT Abdomen/Pelvis which did show pneumoperitoneum but no other evidence of obstruction or hollow viscus injury. He is admitted to the medicine service. Currently on Unasyn.   Surgery is consulted by emergency medicine physician Dr. Twilla Galea, MD in this context for evaluation and management of gastrostomy tube in setting of possible aspiration pneumonia.  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 Cornett, MD;  Location: [facility];  Service: General;  Laterality: Right;   BACK SURGERY  10/14/1995   lower back   BRONCHOSCOPY, WITH BIOPSY USING ELECTROMAGNETIC NAVIGATION Bilateral 02/16/2024   Procedure: ROBOTIC ASSISTED NAVIGATIONAL BRONCHOSCOPY;  Surgeon: Vergia Glasgow, MD;  Location: [facility];  Service: Pulmonary;  Laterality: Bilateral;   CARPAL TUNNEL RELEASE Right 03/14/2023   colonscopy  10/13/2010   CREATION, GASTROSTOMY, OPEN N/A 02/23/2024   Procedure: CREATION, GASTROSTOMY, OPEN;  Surgeon: Emmalene Hare, MD;  Location: [facility];  Service: General;  Laterality: N/A;   EXCISION MELANOMA WITH SENTINEL LYMPH NODE BIOPSY Right 11/23/2013   Procedure: EXCISION MELANOMA WITH SENTINEL LYMPH NODE BIOPSY;  Surgeon: Brandy Cal. Cornett, MD;  Location: [facility];  Service: General;  Laterality: Right;   EYE SURGERY     cataract left, reduced pressure in left   left middle finger surgery after injury  yrs ago   PORTACATH PLACEMENT N/A 02/23/2024   Procedure: INSERTION, TUNNELED CENTRAL VENOUS DEVICE, WITH PORT;  Surgeon: Emmalene Hare, MD;  Location: [facility];  Service: General;  Laterality: N/A;   ROBOT ASSISTED LAPAROSCOPIC RADICAL PROSTATECTOMY  06/07/2012   Procedure: ROBOTIC ASSISTED LAPAROSCOPIC RADICAL PROSTATECTOMY LEVEL 2;  Surgeon: Kristeen Peto, MD;  Location: [facility];  Service: Urology;  Laterality: N/A;  MEDICATIONS:  Prior to Admission medications   Medication Sig Start Date End Date Taking? Authorizing Provider  acetaminophen  (TYLENOL ) 160 MG/5ML solution Take 30 mLs (960 mg total) by mouth every 6 (six) hours as needed for mild pain (pain score 1-3). 02/23/24   Emmalene Hare, MD  benzonatate  (TESSALON ) 200 MG capsule Take 1 capsule (200 mg  total) by mouth 3 (three) times daily as needed for cough. 12/14/23   Letvak, Richard I, MD  cyanocobalamin 1000 MCG tablet Take 1 tablet by mouth every other day. 250 MG    [provider]  dorzolamide -timolol  (COSOPT ) 22.3-6.8 MG/ML ophthalmic solution Place 1 drop into both eyes 2 (two) times daily.    [provider]  ibuprofen  (ADVIL ) 100 MG/5ML suspension Take 30 mLs (600 mg total) by mouth every 8 (eight) hours as needed for moderate pain (pain score 4-6). 02/23/24   Emmalene Hare, MD  latanoprost  (XALATAN ) 0.005 % ophthalmic solution Place 1 drop into both eyes 2 (two) times daily.    [provider]  lidocaine -prilocaine  (EMLA ) cream Apply to affected area once 02/19/24   Avonne Boettcher, MD  losartan -hydrochlorothiazide (HYZAAR) 50-12.5 MG tablet TAKE 1 TABLET BY MOUTH EVERY DAY 01/06/24   Letvak, Richard I, MD  metFORMIN  (GLUCOPHAGE -XR) 500 MG 24 hr tablet TAKE 1 TABLET BY MOUTH EVERY DAY WITH BREAKFAST 11/06/23   Helaine Llanos, MD  Multiple Vitamin (MULTI-VITAMIN) tablet Take 1 tablet by mouth daily.    [provider]  Nutritional Supplements (NUTREN 1.5) LIQD Give 1 carton 6 times a day in feeding tube via bolus syringe.  Flush with 60ml of water  before and of water  after each feeding.   Provide equivalent formula if current formula not available. 02/23/24   Avonne Boettcher, MD  omeprazole  (PRILOSEC  OTC) 20 MG tablet Take 20 mg by mouth as needed.     [provider]  ondansetron  (ZOFRAN ) 8 MG tablet Take 1 tablet (8 mg total) by mouth every 8 (eight) hours as needed for nausea or vomiting. 02/19/24   Avonne Boettcher, MD  oxyCODONE  (ROXICODONE ) 5 MG/5ML solution Take 5 mLs (5 mg total) by mouth every 4 (four) hours as needed for severe pain (pain score 7-10). 02/23/24   Emmalene Hare, MD  prochlorperazine  (COMPAZINE ) 10 MG tablet Take 1 tablet (10 mg total) by mouth every 6 (six) hours as needed for nausea or vomiting. 02/19/24   Avonne Boettcher, MD   rosuvastatin  (CRESTOR ) 10 MG tablet Take 1 tablet (10 mg total) by mouth 2 (two) times a week. 06/11/23   Helaine Llanos, MD  tadalafil  (CIALIS ) 20 MG tablet Take 0.5-1 tablets (10-20 mg total) by mouth every other day as needed for erectile dysfunction. Patient not taking: Reported on 02/23/2024 11/03/22   Letvak, Richard I, MD  zinc gluconate 50 MG tablet Take 50 mg by mouth daily.    [provider]     ALLERGIES:  Allergies  Allergen Reactions   Lisinopril Cough   Pollen Extract Cough     SOCIAL HISTORY:  Social History   Socioeconomic History   Marital status: Married    Spouse name: Wallene Gum   Number of children: 3   Years of education: some college   Highest education level: Not on file  Occupational History   Occupation: Roofing--now Merchandiser, retail  Tobacco Use   Smoking status: Never    Passive exposure: Never   Smokeless tobacco: Never  Vaping Use   Vaping status: Never Used  Substance and Sexual Activity   Alcohol use: Yes    Comment: 1-2 times a week, 1 drink   Drug use: No   Sexual activity: Not Currently  Other Topics Concern   Not on file  Social History Narrative   05/03/20   From: [place]   Living: with wife Wallene Gum [phone]) and adult daughter   Work: roofing business       Family: Benedetta Bradley and Megan Spindle (also has one son who has passed away) - 2 grandchildren      Enjoys: work on cars, travel, vacation      Exercise: climbing Paediatric nurse and working   Diet: country cooking - eat a lot of veggies       No living will    Wife should be decision maker--alternate is daughters   Would accept resuscitation   Would accept tube feeds--temporary   Social Drivers of Corporate investment banker Strain: Not on file  Food Insecurity: No Food Insecurity (02/29/2024)   Hunger Vital Sign    Worried About Running Out of Food in the Last Year: Never true    Ran Out of Food in the Last Year: Never true  Transportation Needs: No Transportation Needs  (02/19/2024)   PRAPARE - Administrator, Civil Service (Medical): No    Lack of Transportation (Non-Medical): No  Physical Activity: Not on file  Stress: Not on file  Social Connections: Moderately Integrated (02/29/2024)   Social Connection and Isolation Panel [NHANES]    Frequency of Communication with Friends and Family: Three times a week    Frequency of Social Gatherings with Friends and Family: Twice a week    Attends Religious Services: 1 to 4 times per year    Active Member of [employer] or Organizations: No    Attends Banker Meetings: Never    Marital Status: Married  Catering manager Violence: Not At Risk (02/29/2024)   Humiliation, Afraid, Rape, and Kick questionnaire    Fear of Current or Ex-Partner: No    Emotionally Abused: No    Physically Abused: No    Sexually Abused: No     FAMILY HISTORY:  Family History  Problem Relation Age of Onset   Heart disease Mother    Cancer Father        mesthelioma   Glaucoma Father    Hypertension Sister    Hypertension Brother    Arrhythmia Brother    Birth defects Son        Batton's disease   Glaucoma Sister       REVIEW OF SYSTEMS:  Review of Systems  Constitutional:  Negative for chills and fever.  HENT:  Negative for congestion and sore throat.        + Trouble managing secretions, + drooling   Respiratory:  Positive for cough and shortness of breath.   Cardiovascular:  Negative for chest pain and palpitations.  Gastrointestinal:  Positive for abdominal pain. Negative for nausea and vomiting.  Genitourinary:  Negative for dysuria and urgency.  All other systems reviewed and are negative.   VITAL SIGNS:  Temp:  [98.4 F (36.9 C)-98.6 F (37 C)] 98.4 F (36.9 C) (05/19 0900) Pulse Rate:  [104-133] 113 (05/19 0900) Resp:  [19-25] 19 (05/19 0900) BP: (115-164)/(69-104) 146/81 (05/19 0900) SpO2:  [91 %-99 %] 97 % (05/19 0900) Weight:  [77.3 kg] 77.3 kg (05/19 0553)     Height: 5\' 6"  (167.6 cm)  Weight: 77.3 kg BMI (Calculated): 27.54  INTAKE/OUTPUT:  No intake/output data recorded.  PHYSICAL EXAM:  Physical Exam Vitals and nursing note reviewed. Exam conducted with a chaperone present.  Constitutional:      Interventions: Nasal cannula in place.     Comments: Patient sitting up in bed, drooling. Does not participate significantly in interview. Wife at bedside   HENT:     Head: Normocephalic.     Comments: Abrasion to left forehead, superior to eyebrow  Eyes:     General: No scleral icterus.    Conjunctiva/sclera: Conjunctivae normal.  Cardiovascular:     Rate and Rhythm: Tachycardia present.     Pulses: Normal pulses.  Chest:     Comments: Port to the left chest, site CDI, mildly ecchymotic in healing stages. Abdominal:     General: Abdomen is flat. A surgical scar is present. There is no distension.     Palpations: Abdomen is soft.     Tenderness: There is abdominal tenderness in the periumbilical area. There is no guarding or rebound.     Comments: Abdomen is soft, he is sore expectedly over upper midline incisions, non-distended, no rebound/guarding. He is not peritonitic.  Gastrostomy tube in left abdomen, disc was cinched, no drainage, dressing changed.   Genitourinary:    Comments: Deferred Musculoskeletal:     Right lower leg: No edema.     Left lower leg: No edema.  Skin:    General: Skin is warm and dry.     Coloration: Skin is not jaundiced.     Comments: Incisions to upper midline of the abdomen, dermabond in place, mildly ecchymotic in healing stages.      Labs:     Latest Ref Rng & Units 02/29/2024    5:57 AM 02/26/2024    1:05 PM 02/17/2024    8:45 PM  CBC  WBC 4.0 - 10.5 K/uL 34.3  13.1  13.6   Hemoglobin 13.0 - 17.0 g/dL 04.5  40.9  81.1   Hematocrit 39.0 - 52.0 % 38.2  34.4  38.6   Platelets 150 - 400 K/uL 372  296  311       Latest Ref Rng & Units 02/29/2024    5:57 AM 02/26/2024    1:04 PM 02/17/2024    8:45 PM  CMP  Glucose 70 - 99  mg/dL 914  782  956   BUN 8 - 23 mg/dL 17  14  13    Creatinine 0.61 - 1.24 mg/dL 2.13  0.86  5.78   Sodium 135 - 145 mmol/L 134  131  135   Potassium 3.5 - 5.1 mmol/L 3.2  3.4  3.6   Chloride 98 - 111 mmol/L 95  95  98   CO2 22 - 32 mmol/L 27  27  27    Calcium  8.9 - 10.3 mg/dL 9.2  8.8  9.2   Total Protein 6.5 - 8.1 g/dL 7.4  7.1    Total Bilirubin 0.0 - 1.2 mg/dL 2.1  1.1    Alkaline Phos 38 - 126 U/L 76  77    AST 15 - 41 U/L 18  19    ALT 0 - 44 U/L 16  14      Imaging studies:   CT Chest/Abdomen/Pelvis (02/29/2024) personally reviewed with gastrostomy tube in place, stomach not overtly distended, there is noted pneumoperitoneum secondary to recent procedure but no other evidence of hollow viscus injury, and radiologist report reviewed below:  IMPRESSION: 1. No evidence of pulmonary arterial dilatation or embolus. 2. 7  x 5.6 cm right lower lobe mass centered in the superior segment, not larger than on 02/15/2024 but slightly increased since 02/04/2024. 3. Aspiration fluid in the trachea, right middle and lower lobe bronchi, some in the right upper lobe main bronchus, with diffuse bronchial thickening and with multilobar pneumonia described in detail above, right greater than left. 4. Increasingly prominent mediastinal and right hilar lymph nodes. 5. New small anterior pericardial effusion. 6. Aortic and coronary artery atherosclerosis. 7. PEG tube in place with the balloon in the stomach. There is moderate free air in the upper abdomen more than expected 6 days out from PEG placement. A gastric leak is not excluded but there is no free fluid. Furthermore no focal inflammatory changes seen around the where the tube enters the stomach. Clinical correlation and follow-up recommended. 8. No evidence of metastatic disease in the abdomen or pelvis. 9. 3 mm nonobstructing left renal stone. 10. Diverticulosis without evidence of diverticulitis. 11. The PET-CT demonstrated abnormal  activity in the posterior superior facet of the left greater trochanter concerning for metastatic disease, but which does not have a CT correlate.    Assessment/Plan: 73 y.o. male presenting with aspiration pneumonia, incidentally found to have persistent pneumoperitoneum secondary to placement of open gastrostomy tube on 05/13, complicated by pertinent comorbidities including metastatic melanoma, malnutrition.   - Agree that degree of pneumoperitoneum seen on imaging is more than expected 6 days after surgery; however, there is no other radiographic findings (ie; inflammation, free fluid) to suggest missed bowel injury or other complication. This area likely still secondary to his procedure. Wife also reports that disc has not been cinched well in last 24 hours which may be another nidus for this air.  - No evidence of leaking around gastrostomy tube nor overt evidence of obstruction secondary to this tube. For now, will hold tube feedings today. If he is doing better tomorrow (05/20), we can try trickle feeds and take it very slow  - No need for surgical intervention at this time.   - Recommend NPO today in setting of possible aspiration - Continue IV Abx (Unasyn)  - Further management per primary service; we will remain available    All of the above findings and recommendations were discussed with the patient and his family, and all of their questions were answered to their expressed satisfaction.  Thank you for the opportunity to participate in this patient's care.   -- Apolonio Bay, PA-C [facility] 02/29/2024, 9:51 AM M-F: 7am - 4pm

## 2024-02-29 NOTE — ED Notes (Addendum)
 RN deep suctioned pt at this time. RN was able get approx 5-10 ml's of what appeared to be tube feed. RN notified provider of findings.

## 2024-02-29 NOTE — ED Provider Notes (Signed)
 [facility] Provider Note    Event Date/Time   First MD Initiated Contact with Patient 02/29/24 [phone]     (approximate)   History   Chief Complaint: Shortness of Breath   HPI  Mason Stewart is a 73 y.o. male with a history of prostate cancer diabetes GERD hypertension metastatic melanoma who was brought to the ED due to shortness of breath and weakness, rapidly progressing over the last day.  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   Prostate cancer (HCC)    Sleep apnea    stopbang =7-has not had a study   Wound dehiscence 12/12/2013    Current Outpatient Rx   Order #: [id number] Class: OTC   Order #: [id number] Class: Normal   Order #: [id number] Class: Historical Med   Order #: [id number] Class: Historical Med   Order #: [id number] Class: Normal   Order #: [id number] Class: Historical Med   Order #: [id number] Class: Normal   Order #: [id number] Class: Normal   Order #: [id number] Class: Normal   Order #: [id number] Class: Historical Med   Order #: [id number] Class: Print   Order #: [id number] Class: Historical Med   Order #: [id number] Class: Normal   Order #: [id number] Class: Normal   Order #: [id number] Class: Normal   Order #: [id number] Class: Normal   Order #: [id number] Class: Normal   Order #: [id number] Class: Historical Med    Past Surgical History:  Procedure Laterality Date   AXILLARY LYMPH NODE DISSECTION Right 12/13/2013   Procedure: AXILLARY LYMPH NODE DISSECTION;  Surgeon: Andy Bannister A. Cornett, MD;  Location:  [facility];  Service: General;  Laterality: Right;   BACK SURGERY  10/14/1995   lower back   BRONCHOSCOPY, WITH BIOPSY USING ELECTROMAGNETIC NAVIGATION Bilateral 02/16/2024   Procedure: ROBOTIC ASSISTED NAVIGATIONAL BRONCHOSCOPY;  Surgeon: Vergia Glasgow, MD;  Location: [facility];  Service: Pulmonary;  Laterality: Bilateral;   CARPAL TUNNEL RELEASE Right 03/14/2023   colonscopy  10/13/2010   CREATION, GASTROSTOMY, OPEN N/A 02/23/2024   Procedure: CREATION, GASTROSTOMY, OPEN;  Surgeon: Emmalene Hare, MD;  Location: [facility];  Service: General;  Laterality: N/A;   EXCISION MELANOMA WITH SENTINEL LYMPH NODE BIOPSY Right 11/23/2013   Procedure: EXCISION MELANOMA WITH SENTINEL LYMPH NODE BIOPSY;  Surgeon: Brandy Cal. Cornett, MD;  Location: [facility];  Service: General;  Laterality: Right;   EYE SURGERY     cataract left, reduced pressure in left   left middle finger surgery after injury  yrs ago   PORTACATH PLACEMENT N/A 02/23/2024   Procedure: INSERTION, TUNNELED CENTRAL VENOUS DEVICE, WITH PORT;  Surgeon: Emmalene Hare, MD;  Location: [facility];  Service: General;  Laterality: N/A;   ROBOT ASSISTED LAPAROSCOPIC RADICAL PROSTATECTOMY  06/07/2012   Procedure: ROBOTIC ASSISTED LAPAROSCOPIC RADICAL PROSTATECTOMY LEVEL 2;  Surgeon: Kristeen Peto, MD;  Location: [facility];  Service: Urology;  Laterality: N/A;        Physical Exam   Triage Vital Signs: ED Triage Vitals  Encounter Vitals Group  BP 02/29/24 0552 115/69     Systolic BP Percentile --      Diastolic BP Percentile --      Pulse Rate 02/29/24 0552 (!) 131     Resp 02/29/24 0552 (!) 22     Temp 02/29/24 0552 98.6 F (37 C)     Temp Source 02/29/24 0552 Oral     SpO2 02/29/24 0552 91 %     Weight 02/29/24 0553 170 lb 8.4 oz (77.3 kg)     Height 02/29/24 0553 5\' 6"  (1.676 m)     Head Circumference --      Peak Flow --      Pain Score 02/29/24 0553 0     Pain Loc --      Pain Education --      Exclude from  Growth Chart --     Most recent vital signs: Vitals:   02/29/24 0552 02/29/24 0649  BP: 115/69   Pulse: (!) 131 (!) 124  Resp: (!) 22 (!) 25  Temp: 98.6 F (37 C)   SpO2: 91% 93%    General: Awake, no distress.  Ill-appearing CV:  Good peripheral perfusion.  Tachycardia heart rate 130 Resp:  Tachypnea.  Decreased breath sounds in the right base, otherwise good air movement without wheezes or crackles in the other lung fields. Abd:  No distention.  Soft nontender Other:  No lower extremity edema.  Dry oral mucosa   ED Results / Procedures / Treatments   Labs (all labs ordered are listed, but only abnormal results are displayed) Labs Reviewed  CBC WITH DIFFERENTIAL/PLATELET - Abnormal; Notable for the following components:      Result Value   WBC 34.3 (*)    HCT 38.2 (*)    MCHC 36.4 (*)    Neutro Abs 31.0 (*)    Monocytes Absolute 1.9 (*)    Abs Immature Granulocytes 0.28 (*)    All other components within normal limits  COMPREHENSIVE METABOLIC PANEL WITH GFR - Abnormal; Notable for the following components:   Sodium 134 (*)    Potassium 3.2 (*)    Chloride 95 (*)    Glucose, Bld 195 (*)    Total Bilirubin 2.1 (*)    All other components within normal limits  CULTURE, BLOOD (ROUTINE X 2)  CULTURE, BLOOD (ROUTINE X 2)  LACTIC ACID, PLASMA  LACTIC ACID, PLASMA  BLOOD GAS, VENOUS  BRAIN NATRIURETIC PEPTIDE  TROPONIN I (HIGH SENSITIVITY)     EKG Interpreted by me Sinus tachycardia rate 129.  Right axis, normal intervals.  Right bundle branch block.  Mild anterior ST depression.   RADIOLOGY Chest x-ray interpreted by me, shows mass in the right lower lung.  No infiltrates or edema.   PROCEDURES:  .Critical Care  Performed by: Jacquie Maudlin, MD Authorized by: Jacquie Maudlin, MD   Critical care provider statement:    Critical care time (minutes):  35   Critical care time was exclusive of:  Separately billable procedures and treating other patients    Critical care was necessary to treat or prevent imminent or life-threatening deterioration of the following conditions:  Sepsis and respiratory failure   Critical care was time spent personally by me on the following activities:  Development of treatment plan with patient or surrogate, discussions with consultants, evaluation of patient's response to treatment, examination of patient, obtaining history from patient or surrogate, ordering and performing treatments and interventions, ordering and review of laboratory studies, ordering and review of radiographic  studies, pulse oximetry, re-evaluation of patient's condition and review of old charts    MEDICATIONS ORDERED IN ED: Medications  ceFEPIme  (MAXIPIME ) 2 g in sodium chloride  0.9 % 100 mL IVPB (2 g Intravenous New Bag/Given 02/29/24 0642)  metroNIDAZOLE  (FLAGYL ) IVPB 500 mg (has no administration in time range)  vancomycin  (VANCOCIN ) IVPB 1000 mg/200 mL premix (has no administration in time range)  sodium chloride  0.9 % bolus 1,000 mL (1,000 mLs Intravenous New Bag/Given 02/29/24 0648)     IMPRESSION / MDM / ASSESSMENT AND PLAN / ED COURSE  I reviewed the triage vital signs and the nursing notes.  DDx: Pneumonia, pleural effusion, pulmonary edema, pneumothorax, non-STEMI, pulmonary embolism, dehydration, electrolyte derangement  Patient's presentation is most consistent with acute presentation with potential threat to life or bodily function.  Patient presents with shortness of breath, weakness, clinically appears dehydrated.  Will give IV fluids, empiric antibiotics for suspected sepsis.  Will obtain CT chest abdomen pelvis to further evaluate in the setting of critical illness.  He was hypoxic on arrival, which is stabilized with 4 to 6 L nasal cannula oxygen.       FINAL CLINICAL IMPRESSION(S) / ED DIAGNOSES   Final diagnoses:  SOB (shortness of breath)  Acute respiratory failure with hypoxia (HCC)     Rx / DC Orders   ED  Discharge Orders     None        Note:  This document was prepared using Dragon voice recognition software and may include unintentional dictation errors.   Jacquie Maudlin, MD 02/29/24 [id number]

## 2024-03-01 ENCOUNTER — Institutional Professional Consult (permissible substitution): Admitting: Radiation Oncology

## 2024-03-01 ENCOUNTER — Inpatient Hospital Stay

## 2024-03-01 ENCOUNTER — Telehealth: Payer: Self-pay | Admitting: *Deleted

## 2024-03-01 DIAGNOSIS — G9340 Encephalopathy, unspecified: Secondary | ICD-10-CM

## 2024-03-01 DIAGNOSIS — Z515 Encounter for palliative care: Secondary | ICD-10-CM | POA: Diagnosis not present

## 2024-03-01 DIAGNOSIS — C7931 Secondary malignant neoplasm of brain: Secondary | ICD-10-CM

## 2024-03-01 DIAGNOSIS — J690 Pneumonitis due to inhalation of food and vomit: Secondary | ICD-10-CM | POA: Diagnosis not present

## 2024-03-01 DIAGNOSIS — Z7189 Other specified counseling: Secondary | ICD-10-CM | POA: Diagnosis not present

## 2024-03-01 DIAGNOSIS — J9601 Acute respiratory failure with hypoxia: Secondary | ICD-10-CM | POA: Diagnosis not present

## 2024-03-01 LAB — BASIC METABOLIC PANEL WITH GFR
Anion gap: 10 (ref 5–15)
BUN: 12 mg/dL (ref 8–23)
CO2: 23 mmol/L (ref 22–32)
Calcium: 7.8 mg/dL — ABNORMAL LOW (ref 8.9–10.3)
Chloride: 102 mmol/L (ref 98–111)
Creatinine, Ser: 0.54 mg/dL — ABNORMAL LOW (ref 0.61–1.24)
GFR, Estimated: >60 mL/min (ref >60)
Glucose, Bld: 143 mg/dL — ABNORMAL HIGH (ref 70–99)
Potassium: 2.8 mmol/L — ABNORMAL LOW (ref 3.5–5.1)
Sodium: 135 mmol/L (ref 135–145)

## 2024-03-01 LAB — CBC
HCT: 32 % — ABNORMAL LOW (ref 39.0–52.0)
Hemoglobin: 11.6 g/dL — ABNORMAL LOW (ref 13.0–17.0)
MCH: 30.9 pg (ref 26.0–34.0)
MCHC: 36.3 g/dL — ABNORMAL HIGH (ref 30.0–36.0)
MCV: 85.3 fL (ref 80.0–100.0)
Platelets: 252 10*3/uL (ref 150–400)
RBC: 3.75 MIL/uL — ABNORMAL LOW (ref 4.22–5.81)
RDW: 13 % (ref 11.5–15.5)
WBC: 18.3 10*3/uL — ABNORMAL HIGH (ref 4.0–10.5)
nRBC: 0 % (ref 0.0–0.2)

## 2024-03-01 LAB — URINALYSIS, ROUTINE W REFLEX MICROSCOPIC
Bilirubin Urine: NEGATIVE
Glucose, UA: NEGATIVE mg/dL
Hgb urine dipstick: NEGATIVE
Ketones, ur: 5 mg/dL — AB
Leukocytes,Ua: NEGATIVE
Nitrite: NEGATIVE
Protein, ur: NEGATIVE mg/dL
Specific Gravity, Urine: 1.023 (ref 1.005–1.030)
pH: 6 (ref 5.0–8.0)

## 2024-03-01 LAB — CBG MONITORING, ED
Glucose-Capillary: 134 mg/dL — ABNORMAL HIGH (ref 70–99)
Glucose-Capillary: 128 mg/dL — ABNORMAL HIGH (ref 70–99)

## 2024-03-01 MED ORDER — OSMOLITE 1.5 CAL PO LIQD
1000.0000 mL | ORAL | Status: DC
  Administered 2024-03-01: 1000 mL

## 2024-03-01 MED ORDER — FREE WATER: 30.0000 mL | Status: DC

## 2024-03-01 MED ORDER — CHLORHEXIDINE GLUCONATE CLOTH 2 % EX PADS
6.0000 | MEDICATED_PAD | Freq: Every day | CUTANEOUS | Status: DC
  Filled 2024-03-01: qty 6

## 2024-03-01 MED ORDER — GLYCOPYRROLATE 0.2 MG/ML IJ SOLN
0.2000 mg | INTRAMUSCULAR | Status: DC | PRN
Start: 2024-03-01 — End: 2024-03-02
  Administered 2024-03-01: 0.2 mg via INTRAVENOUS
  Filled 2024-03-01: qty 1

## 2024-03-01 MED ORDER — LORAZEPAM 2 MG/ML IJ SOLN
1.0000 mg | INTRAMUSCULAR | Status: DC | PRN
  Administered 2024-03-01 – 2024-03-03 (×4): 1 mg via INTRAVENOUS
  Filled 2024-03-01 (×4): qty 1

## 2024-03-01 MED ORDER — VITAL HIGH PROTEIN PO LIQD: 1000.0000 mL | ORAL | Status: DC

## 2024-03-01 MED ORDER — SODIUM CHLORIDE 0.9% FLUSH: 10.0000 mL | INTRAVENOUS | Status: DC | PRN

## 2024-03-01 MED ORDER — MORPHINE SULFATE (PF) 2 MG/ML IV SOLN
2.0000 mg | INTRAVENOUS | Status: DC | PRN
  Administered 2024-03-03 (×3): 2 mg via INTRAVENOUS
  Filled 2024-03-01 (×4): qty 1

## 2024-03-01 MED ORDER — THIAMINE MONONITRATE 100 MG PO TABS: 100.0000 mg | ORAL_TABLET | Freq: Every day | ORAL | Status: DC

## 2024-03-01 MED ORDER — ADULT MULTIVITAMIN W/MINERALS CH: 1.0000 | ORAL_TABLET | Freq: Every day | ORAL | Status: DC

## 2024-03-01 NOTE — Progress Notes (Signed)
 Palliative Medicine [facility] at [facility] Telephone:([phone] [phone]   Name: Mason Stewart Date: 03/01/2024 MRN: [id number]  DOB: 1951/04/28  Patient Care Team: Helaine Llanos, MD as PCP - General (Internal Medicine) Arlen Lacks, Harrell Lima, MD as Referring Physician (Ophthalmology) Gaynelle Keeling, MD as Consulting Physician (Dermatology) Drake Gens, RN as Oncology Nurse Navigator    REASON FOR CONSULTATION: Mason Stewart is a 73 y.o. male with multiple medical problems including history of melanoma and prostate cancer, now with stage IV melanoma with leptomeningeal spread.  Patient was started on immunotherapy.  Underwent PEG placement due to severe dysphagia and poor oral intake.  Patient now admitted to the hospital with aspiration pneumonia.  Palliative care consulted to address goals.   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 Cornett, MD;  Location: [facility];  Service: General;  Laterality: Right;   BACK SURGERY  10/14/1995   lower back   BRONCHOSCOPY, WITH BIOPSY USING ELECTROMAGNETIC NAVIGATION Bilateral 02/16/2024   Procedure: ROBOTIC ASSISTED NAVIGATIONAL BRONCHOSCOPY;  Surgeon:  Vergia Glasgow, MD;  Location: [facility];  Service: Pulmonary;  Laterality: Bilateral;   CARPAL TUNNEL RELEASE Right 03/14/2023   colonscopy  10/13/2010   CREATION, GASTROSTOMY, OPEN N/A 02/23/2024   Procedure: CREATION, GASTROSTOMY, OPEN;  Surgeon: Emmalene Hare, MD;  Location: [facility];  Service: General;  Laterality: N/A;   EXCISION MELANOMA WITH SENTINEL LYMPH NODE BIOPSY Right 11/23/2013   Procedure: EXCISION MELANOMA WITH SENTINEL LYMPH NODE BIOPSY;  Surgeon: Brandy Cal. Cornett, MD;  Location: [facility];  Service: General;  Laterality: Right;   EYE SURGERY     cataract left, reduced pressure in left   left middle finger surgery after injury  yrs ago   PORTACATH PLACEMENT N/A 02/23/2024   Procedure: INSERTION, TUNNELED CENTRAL VENOUS DEVICE, WITH PORT;  Surgeon: Emmalene Hare, MD;  Location: [facility];  Service: General;  Laterality: N/A;   ROBOT ASSISTED LAPAROSCOPIC RADICAL PROSTATECTOMY  06/07/2012   Procedure: ROBOTIC ASSISTED LAPAROSCOPIC RADICAL PROSTATECTOMY LEVEL 2;  Surgeon: Kristeen Peto, MD;  Location: [facility];  Service: Urology;  Laterality: N/A;        HEMATOLOGY/ONCOLOGY HISTORY:  Oncology History Overview Note  Melanoma of thoracic region   Primary site: Melanoma of the Skin (Right)   Staging method: AJCC 7th Edition   Clinical: Stage III (T2, N1, M0) signed by Almeda Jacobs, MD on 12/23/2013  9:55 AM   Pathologic: (T2, N1, cM0) signed by Almeda Jacobs, MD on 12/23/2013  9:56 AM   Summary: Stage III (T2, N1, cM0)  Also history of prostate cancer T2cN0M0 status post prostatectomy.   Melanoma of thoracic region [facility]) (  Resolved)  06/07/2012 Surgery   The patient underwent prostate surgery which show Gleason 3+4 involving both lobes.   10/20/2013 Procedure   The patient underwent biopsy to confirm superficial spreading melanoma, 1.82 mm thickness   11/23/2013 Surgery   The patient underwent wide local excision and sentinel lymph node biopsy which showed 1 lymph node  involvement.   12/13/2013 Surgery   The patient underwent in complete lymph node dissection did show no evidence of disease.   02/20/2014 Imaging   Staging PET CT scan show no evidence of disease recurrence.   Metastatic melanoma (HCC)  02/19/2024 Initial Diagnosis   Metastatic melanoma (HCC)   02/19/2024 Cancer Staging   Staging form: Melanoma of the Skin, AJCC 8th Edition - Clinical stage from 02/19/2024: Stage IV (pM1d) - Signed by Avonne Boettcher, MD on 02/19/2024 Stage prefix: Recurrence   02/25/2024 -  Chemotherapy   Patient is on Treatment Plan : MELANOMA Nivolumab  (3) + Ipilimumab  (1) q21d / Nivolumab  (480) q28d       ALLERGIES:  is allergic to lisinopril and pollen extract.  MEDICATIONS:  Current Facility-Administered Medications  Medication Dose Route Frequency Provider Last Rate Last Admin   0.9 %  sodium chloride  infusion   Intravenous Continuous Margery Sheets B, MD 100 mL/hr at 03/01/24 1506 Infusion Verify at 03/01/24 1506   acetaminophen  (TYLENOL ) 160 MG/5ML solution 960 mg  960 mg Per Tube Q6H PRN [place], MD       Chlorhexidine  Gluconate Cloth 2 % PADS 6 each  6 each Topical Daily Mosetta Areola, Sudheer B, MD       dorzolamide -timolol  (COSOPT ) 2-0.5 % ophthalmic solution 1 drop  1 drop Both Eyes BID Antoniette Batty T, MD   1 drop at 03/01/24 1007   glycopyrrolate  (ROBINUL ) injection 0.2 mg  0.2 mg Intravenous Q4H PRN Jacek Colson R, NP   0.2 mg at 03/01/24 1655   haloperidol  lactate (HALDOL ) injection 2 mg  2 mg Intravenous Q6H PRN Antoniette Batty T, MD       latanoprost  (XALATAN ) 0.005 % ophthalmic solution 1 drop  1 drop Both Eyes BID Antoniette Batty T, MD   1 drop at 03/01/24 1007   LORazepam (ATIVAN) injection 1-2 mg  1-2 mg Intravenous Q4H PRN Belenda Alviar R, NP       morphine  (PF) 2 MG/ML injection 2 mg  2 mg Intravenous Q1H PRN Juanda Luba R, NP       sodium chloride  flush (NS) 0.9 % injection 10-40 mL  10-40 mL Intracatheter PRN Mosetta Areola, Sudheer B, MD         VITAL SIGNS: BP (!) 172/90 (BP Location: Left Arm)   Pulse 92   Temp 98.9 F (37.2 C)   Resp 18   Ht 5\' 6"  (1.676 m)   Wt 170 lb 8.4 oz (77.3 kg)   SpO2 98%   BMI 27.52 kg/m  Filed Weights   02/29/24 0553  Weight: 170 lb 8.4 oz (77.3 kg)    Estimated body mass index is 27.52 kg/m as calculated from the following:   Height as of this encounter: 5\' 6"  (1.676 m).   Weight as of this encounter: 170 lb 8.4 oz (77.3 kg).  LABS: CBC:    Component Value Date/Time   WBC 18.3 (H) 03/01/2024 0432   HGB 11.6 (L) 03/01/2024 0432   HGB 12.5 (L) 02/26/2024 1305   HGB 15.5 09/26/2014 0832   HCT 32.0 (L) 03/01/2024 0432   HCT 43.2 09/26/2014 9604  PLT 252 03/01/2024 0432   PLT 296 02/26/2024 1305   PLT 223 09/26/2014 0832   MCV 85.3 03/01/2024 0432   MCV 86.6 09/26/2014 0832   NEUTROABS 31.0 (H) 02/29/2024 0557   NEUTROABS 5.4 09/26/2014 0832   LYMPHSABS 0.8 02/29/2024 0557   LYMPHSABS 1.0 09/26/2014 0832   MONOABS 1.9 (H) 02/29/2024 0557   MONOABS 0.5 09/26/2014 0832   EOSABS 0.2 02/29/2024 0557   EOSABS 0.2 09/26/2014 0832   BASOSABS 0.1 02/29/2024 0557   BASOSABS 0.0 09/26/2014 0832   Comprehensive Metabolic Panel:    Component Value Date/Time   NA 135 03/01/2024 0432   NA 138 02/03/2020 0000   NA 139 09/26/2014 0832   K 2.8 (L) 03/01/2024 0432   K 3.9 09/26/2014 0832   CL 102 03/01/2024 0432   CO2 23 03/01/2024 0432   CO2 22 09/26/2014 0832   BUN 12 03/01/2024 0432   BUN 12 02/03/2020 0000   BUN 13.3 09/26/2014 0832   CREATININE 0.54 (L) 03/01/2024 0432   CREATININE 0.70 02/26/2024 1304   CREATININE 1.0 09/26/2014 0832   GLUCOSE 143 (H) 03/01/2024 0432   GLUCOSE 197 (H) 09/26/2014 0832   CALCIUM  7.8 (L) 03/01/2024 0432   CALCIUM  9.0 09/26/2014 0832   AST 18 02/29/2024 0557   AST 19 02/26/2024 1304   AST 19 09/26/2014 0832   ALT 16 02/29/2024 0557   ALT 14 02/26/2024 1304   ALT 28 09/26/2014 0832   ALKPHOS 76 02/29/2024 0557   ALKPHOS 69 09/26/2014  0832   BILITOT 2.1 (H) 02/29/2024 0557   BILITOT 1.1 02/26/2024 1304   BILITOT 0.92 09/26/2014 0832   PROT 7.4 02/29/2024 0557   PROT 6.5 09/26/2014 0832   ALBUMIN 3.7 02/29/2024 0557   ALBUMIN 3.8 09/26/2014 0832    RADIOGRAPHIC STUDIES: CT SUPER D CHEST WO CONTRAST Result Date: 02/29/2024 CLINICAL DATA:  Right lower lobe lung mass.  Pre bronchoscopy. EXAM: CT CHEST WITHOUT CONTRAST TECHNIQUE: Multidetector CT imaging of the chest was performed using thin slice collimation for electromagnetic bronchoscopy planning purposes, without intravenous contrast. RADIATION DOSE REDUCTION: This exam was performed according to the departmental dose-optimization program which includes automated exposure control, adjustment of the mA and/or kV according to patient size and/or use of iterative reconstruction technique. COMPARISON:  PET 02/04/2024 and CT chest 02/01/2024. FINDINGS: Cardiovascular: Atherosclerotic calcification of the aorta, aortic valve and coronary arteries. Heart is at the upper limits of normal in size. No pericardial effusion. Mediastinum/Nodes: No pathologically enlarged mediastinal or axillary lymph nodes. Hilar regions are difficult to definitively evaluate without IV contrast. Subcarinal lymph node measures 11 mm, previously 14 mm. Surgical clips in the right axilla. Air in the esophagus can be seen with dysmotility. Lungs/Pleura: Calcified granulomas. Right lower lobe mass measures 5.8 x 6.9 cm, stable. Patchy peribronchovascular ground-glass and nodularity in the surrounding right middle and right lower lobes. Additional peribronchovascular ground-glass in the lingula and left lower lobe. No pleural fluid. Airway is unremarkable. Upper Abdomen: Liver margin is slightly irregular. Visualized portions of the liver, gallbladder, adrenal glands, kidneys, spleen, pancreas, stomach and bowel are otherwise grossly unremarkable. No upper abdominal adenopathy. Musculoskeletal: Degenerative changes in  the spine. No worrisome lytic or sclerotic lesions. IMPRESSION: 1. Stable right lower lobe mass, consistent with biopsy-proven metastatic melanoma. 2. Patchy peribronchovascular ground-glass and nodularity in the lung bases, right greater than left, of indeterminate etiology but possibly treatment related. 3. Liver appears mildly cirrhotic. 4. Aortic atherosclerosis (ICD10-I70.0). Coronary artery calcification. Electronically Signed  By: Shearon Denis M.D.   On: 02/29/2024 14:23   CT Angio Chest PE W and/or Wo Contrast Result Date: 02/29/2024 CLINICAL DATA:  Known right lower lobe mass/neoplasm, PET positive, presenting with increased weakness and hypoxia with increased work of breathing. The patient had an open gastrostomy creation 02/23/2024. EXAM: CT ANGIOGRAPHY CHEST CT ABDOMEN AND PELVIS WITH CONTRAST TECHNIQUE: Multidetector CT imaging of the chest was performed using the standard protocol during bolus administration of intravenous contrast. Multiplanar CT image reconstructions and MIPs were obtained to evaluate the vascular anatomy. Multidetector CT imaging of the abdomen and pelvis was performed using the standard protocol during bolus administration of intravenous contrast. RADIATION DOSE REDUCTION: This exam was performed according to the departmental dose-optimization program which includes automated exposure control, adjustment of the mA and/or kV according to patient size and/or use of iterative reconstruction technique. CONTRAST:  OMNIPAQUE  IOHEXOL  350 MG/ML SOLN COMPARISON:  Chest CT without contrast 02/15/2024, PET-CT 02/04/2024, and a portable chest from 02/23/2024 and today. FINDINGS: CTA CHEST FINDINGS Cardiovascular: There is a new small anterior pericardial effusion. The cardiac size is normal. There are left main and 2 vessel coronary calcifications in the LAD and right coronary artery, heaviest in the LAD. There is a new left chest implanted port, with subclavian approach catheter  terminating at about the brachiocephalic/SVC junction. There is atherosclerosis in the aorta and great vessels without aneurysm, dissection or stenosis. No pulmonary arterial dilatation or embolism is seen. Pulmonary veins are normal caliber. There is bronchovascular mass encasement and narrowing along the lower hilar area in the lower lobe. Mediastinum/Nodes: There are increasingly prominent lymph nodes in the azygoesophageal recess, largest 1.5 x 2.5 cm on 2:90. There are increasingly prominent right lower hilar lymph nodes difficult to separate out from the right lower lobe mass. Largest of these is estimated 1.9 cm short axis on 2:91. There is an enlarging right mid hilar node of 1.5 cm on 2: 76, additional subcarinal lymph nodes up to 1 cm. I do not see further adenopathy. Lungs/Pleura: There is moderate fluid consistent with aspiration in the trachea, tracking down into right middle and lower lobe bronchi. Fluid is also seen in the right upper lobe main bronchus and there is diffuse bronchial thickening. There are multiple subsegmental and a few segmental fluid opacified bronchi in the right lower lobe basal segments with the right lower lobe distal main bronchus narrowed by the tumor. There is scattered subsegmental bronchial impactions in the right middle lobe. Right lower lobe mass abutting the hilum is centered in the superior segment and measures 7 x 5.6 cm on 2:10, not larger than on 02/15/2024 but on 02/04/2024 measured 6.5 x 5.5 cm. There is patchy ground-glass airspace disease in the left upper lobe, patchy denser ground-glass intermixed with peribronchovascular nodular airspace disease in the right upper lobe, ill-defined scattered hazy airspace disease in left lower lobe. In the right lower lobe, in addition to a fluid impactions in multiple subsegmental and downstream bronchi, there is patchy bronchopneumonia greatest in the superior segment, with patchy tree-in-bud and ground-glass airspace  disease in the right middle lobe. Findings consistent with multilobar pneumonia. No fluid is seen in the left-sided bronchi. There are no pleural effusions. Musculoskeletal: Degenerative change thoracic spine. No regional bone metastasis. At T8-9 and T9-10 there small left paracentral calcified disc extrusions without overt mass effect. Review of the MIP images confirms the above findings. CT ABDOMEN and PELVIS FINDINGS Hepatobiliary: No liver metastasis is seen. Unremarkable gallbladder and bile  ducts. Pancreas: No abnormality. Spleen: No abnormality. Adrenals/Urinary Tract: No adrenal renal mass enhancement. There is a 2 cm right lower pole parapelvic Bosniak 2 cyst with a Hounsfield density of 27, not requiring follow-up imaging. There is a parapelvic cyst in the lower left kidney which is too small to characterize consistent with another Bosniak 2 cyst. There is a 3 mm caliceal stone in the midpole of the left kidney. No other urolithiasis is seen and no obstructing stones. Normal bladder. Stomach/Bowel: There is a PEG tube in place. The balloon is in the stomach. There is moderate free air in the upper abdomen, more than expected 6 days out from PEG placement. A gastric leak is not excluded but there is no free fluid. Furthermore no focal inflammatory changes seen around the where the tube enters the stomach. Clinical correlation follow-up recommended. No bowel dilatation or wall thickening is seen. The appendix is normal. There is sigmoid diverticulosis, without diverticulitis. Vascular/Lymphatic: Aortic atherosclerosis. No enlarged abdominal or pelvic lymph nodes. Reproductive: The prostate appears to have been removed. No mass is seen in the prostate bed. There are no surgical clips in the pelvic sidewalls. Other: None. Musculoskeletal: There is no focally destructive lesion. The PET-CT demonstrated abnormal activity in the posterior superior facet of the left greater trochanter concerning for metastatic  disease, but which does not have a CT correlate. There is degenerative change of the lumbar spine most advanced at the lowest 2 levels where there is acquired spinal stenosis. 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 Review of the MIP images confirms the above findings. IMPRESSION: 1. No evidence of pulmonary arterial dilatation or embolus. 2. 7 x 5.6 cm right lower lobe mass centered in the superior segment, not larger than on 02/15/2024 but slightly increased since 02/04/2024. 3. Aspiration fluid in the trachea, right middle and lower lobe bronchi, some in the right upper lobe main bronchus, with diffuse bronchial thickening and with multilobar pneumonia described in detail above, right greater than left. 4. Increasingly prominent mediastinal and right hilar lymph nodes. 5. New small anterior pericardial effusion. 6. Aortic and coronary artery atherosclerosis. 7. PEG tube in place with the balloon in the stomach. There is moderate free air in the upper abdomen more than expected 6 days out from PEG placement. A gastric leak is not excluded but there is no free fluid. Furthermore no focal inflammatory changes seen around the where the tube enters the stomach. Clinical correlation and follow-up recommended. 8. No evidence of metastatic disease in the abdomen or pelvis. 9. 3 mm nonobstructing left renal stone. 10. Diverticulosis without evidence of diverticulitis. 11. The PET-CT demonstrated abnormal activity in the posterior superior facet of the left greater trochanter concerning for metastatic disease, but which does not have a CT correlate. 12. Critical Value/emergent results were called by telephone at the time of interpretation on 02/29/2024 at 7:36 am to provider DR. Cleora Daft, who verbally acknowledged these results. Aortic Atherosclerosis (ICD10-I70.0). Electronically Signed   By: Denman Fischer M.D.   On: 02/29/2024 08:11   CT ABDOMEN PELVIS W CONTRAST Result Date: 02/29/2024 CLINICAL DATA:  Known  right lower lobe mass/neoplasm, PET positive, presenting with increased weakness and hypoxia with increased work of breathing. The patient had an open gastrostomy creation 02/23/2024. EXAM: CT ANGIOGRAPHY CHEST CT ABDOMEN AND PELVIS WITH CONTRAST TECHNIQUE: Multidetector CT imaging of the chest was performed using the standard protocol during bolus administration of intravenous contrast. Multiplanar CT image reconstructions and MIPs were obtained to evaluate the  vascular anatomy. Multidetector CT imaging of the abdomen and pelvis was performed using the standard protocol during bolus administration of intravenous contrast. RADIATION DOSE REDUCTION: This exam was performed according to the departmental dose-optimization program which includes automated exposure control, adjustment of the mA and/or kV according to patient size and/or use of iterative reconstruction technique. CONTRAST:  OMNIPAQUE  IOHEXOL  350 MG/ML SOLN COMPARISON:  Chest CT without contrast 02/15/2024, PET-CT 02/04/2024, and a portable chest from 02/23/2024 and today. FINDINGS: CTA CHEST FINDINGS Cardiovascular: There is a new small anterior pericardial effusion. The cardiac size is normal. There are left main and 2 vessel coronary calcifications in the LAD and right coronary artery, heaviest in the LAD. There is a new left chest implanted port, with subclavian approach catheter terminating at about the brachiocephalic/SVC junction. There is atherosclerosis in the aorta and great vessels without aneurysm, dissection or stenosis. No pulmonary arterial dilatation or embolism is seen. Pulmonary veins are normal caliber. There is bronchovascular mass encasement and narrowing along the lower hilar area in the lower lobe. Mediastinum/Nodes: There are increasingly prominent lymph nodes in the azygoesophageal recess, largest 1.5 x 2.5 cm on 2:90. There are increasingly prominent right lower hilar lymph nodes difficult to separate out from the right  lower lobe mass. Largest of these is estimated 1.9 cm short axis on 2:91. There is an enlarging right mid hilar node of 1.5 cm on 2: 76, additional subcarinal lymph nodes up to 1 cm. I do not see further adenopathy. Lungs/Pleura: There is moderate fluid consistent with aspiration in the trachea, tracking down into right middle and lower lobe bronchi. Fluid is also seen in the right upper lobe main bronchus and there is diffuse bronchial thickening. There are multiple subsegmental and a few segmental fluid opacified bronchi in the right lower lobe basal segments with the right lower lobe distal main bronchus narrowed by the tumor. There is scattered subsegmental bronchial impactions in the right middle lobe. Right lower lobe mass abutting the hilum is centered in the superior segment and measures 7 x 5.6 cm on 2:10, not larger than on 02/15/2024 but on 02/04/2024 measured 6.5 x 5.5 cm. There is patchy ground-glass airspace disease in the left upper lobe, patchy denser ground-glass intermixed with peribronchovascular nodular airspace disease in the right upper lobe, ill-defined scattered hazy airspace disease in left lower lobe. In the right lower lobe, in addition to a fluid impactions in multiple subsegmental and downstream bronchi, there is patchy bronchopneumonia greatest in the superior segment, with patchy tree-in-bud and ground-glass airspace disease in the right middle lobe. Findings consistent with multilobar pneumonia. No fluid is seen in the left-sided bronchi. There are no pleural effusions. Musculoskeletal: Degenerative change thoracic spine. No regional bone metastasis. At T8-9 and T9-10 there small left paracentral calcified disc extrusions without overt mass effect. Review of the MIP images confirms the above findings. CT ABDOMEN and PELVIS FINDINGS Hepatobiliary: No liver metastasis is seen. Unremarkable gallbladder and bile ducts. Pancreas: No abnormality. Spleen: No abnormality. Adrenals/Urinary  Tract: No adrenal renal mass enhancement. There is a 2 cm right lower pole parapelvic Bosniak 2 cyst with a Hounsfield density of 27, not requiring follow-up imaging. There is a parapelvic cyst in the lower left kidney which is too small to characterize consistent with another Bosniak 2 cyst. There is a 3 mm caliceal stone in the midpole of the left kidney. No other urolithiasis is seen and no obstructing stones. Normal bladder. Stomach/Bowel: There is a PEG tube in place. The  balloon is in the stomach. There is moderate free air in the upper abdomen, more than expected 6 days out from PEG placement. A gastric leak is not excluded but there is no free fluid. Furthermore no focal inflammatory changes seen around the where the tube enters the stomach. Clinical correlation follow-up recommended. No bowel dilatation or wall thickening is seen. The appendix is normal. There is sigmoid diverticulosis, without diverticulitis. Vascular/Lymphatic: Aortic atherosclerosis. No enlarged abdominal or pelvic lymph nodes. Reproductive: The prostate appears to have been removed. No mass is seen in the prostate bed. There are no surgical clips in the pelvic sidewalls. Other: None. Musculoskeletal: There is no focally destructive lesion. The PET-CT demonstrated abnormal activity in the posterior superior facet of the left greater trochanter concerning for metastatic disease, but which does not have a CT correlate. There is degenerative change of the lumbar spine most advanced at the lowest 2 levels where there is acquired spinal stenosis. 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 Review of the MIP images confirms the above findings. IMPRESSION: 1. No evidence of pulmonary arterial dilatation or embolus. 2. 7 x 5.6 cm right lower lobe mass centered in the superior segment, not larger than on 02/15/2024 but slightly increased since 02/04/2024. 3. Aspiration fluid in the trachea, right middle and lower lobe bronchi, some in the  right upper lobe main bronchus, with diffuse bronchial thickening and with multilobar pneumonia described in detail above, right greater than left. 4. Increasingly prominent mediastinal and right hilar lymph nodes. 5. New small anterior pericardial effusion. 6. Aortic and coronary artery atherosclerosis. 7. PEG tube in place with the balloon in the stomach. There is moderate free air in the upper abdomen more than expected 6 days out from PEG placement. A gastric leak is not excluded but there is no free fluid. Furthermore no focal inflammatory changes seen around the where the tube enters the stomach. Clinical correlation and follow-up recommended. 8. No evidence of metastatic disease in the abdomen or pelvis. 9. 3 mm nonobstructing left renal stone. 10. Diverticulosis without evidence of diverticulitis. 11. The PET-CT demonstrated abnormal activity in the posterior superior facet of the left greater trochanter concerning for metastatic disease, but which does not have a CT correlate. 12. Critical Value/emergent results were called by telephone at the time of interpretation on 02/29/2024 at 7:36 am to provider DR. Cleora Daft, who verbally acknowledged these results. Aortic Atherosclerosis (ICD10-I70.0). Electronically Signed   By: Denman Fischer M.D.   On: 02/29/2024 08:11   DG Chest Port 1 View Result Date: 02/29/2024 CLINICAL DATA:  Increased shortness of breath. EXAM: PORTABLE CHEST 1 VIEW COMPARISON:  02/23/2024 FINDINGS: There is a left chest wall porta catheter with tip in the projection of the SVC. Heart size and mediastinal contours are unremarkable. Right lower lobe lung mass is again noted compatible with malignancy. No superimposed pleural effusion, edema or airspace consolidation. Interval decrease in pneumoperitoneum compared with the previous exam. Visualized osseous structures appear intact. IMPRESSION: 1. No acute cardiopulmonary abnormalities. 2. Right lower lobe lung mass compatible with malignancy.  3. Interval decrease in pneumoperitoneum compared with the previous exam. Electronically Signed   By: Kimberley Penman M.D.   On: 02/29/2024 06:23   DG [facility] MEDICARE SPEECH PATH Result Date: 02/25/2024 CLINICAL DATA:  73 year old male with history of CVA, with esophageal dysphagia. EXAM: MODIFIED BARIUM SWALLOW TECHNIQUE: Different consistencies of barium were administered orally to the patient by the Speech Pathologist. Imaging of the pharynx was performed in  the lateral projection. Charles care, PA-C was present in the fluoroscopy room during this study, which was supervised and interpreted by Dr. Myrlene Asper, MD. FLUOROSCOPY: Radiation Exposure Index (as provided by the fluoroscopic device): 12.30 mGy Kerma COMPARISON:  None Available. FINDINGS: Vestibular  Penetration:  Noted with thin liquids Aspiration:  Noted with thin liquids Other: Patient experienced difficulty swallowing soft foods as well, with ineffective swallow reflex. Early spillage. IMPRESSION: Vestibular penetration with aspiration noted with thin liquids. Poor swallowing reflex noted with soft foods. Please refer to the Speech Pathologists report for complete details and recommendations. Electronically Signed   By: Myrlene Asper D.O.   On: 02/25/2024 15:05   DG Chest Port 1 View Result Date: 02/23/2024 CLINICAL DATA:  Port-A-Cath placement. EXAM: PORTABLE CHEST 1 VIEW COMPARISON:  Feb 16, 2024. FINDINGS: The heart size and mediastinal contours are within normal limits. Left subclavian Port-A-Cath is noted with distal tip in expected position of the SVC. No pneumothorax is noted. Stable large right lower lobe mass is noted concerning for malignancy. Free air is noted under both hemidiaphragms consistent with gastrostomy placement today. The visualized skeletal structures are unremarkable. IMPRESSION: Left subclavian Port-A-Cath is noted with distal tip in expected position of the SVC. Stable right lower lobe mass is noted consistent  with malignancy. Electronically Signed   By: Rosalene Colon M.D.   On: 02/23/2024 12:16   DG C-Arm 1-60 Min-No Report Result Date: 02/23/2024 Fluoroscopy was utilized by the requesting physician.  No radiographic interpretation.   DG Chest Port 1 View Result Date: 02/16/2024 CLINICAL DATA:  Status post bronchoscopy, right lower lobe mass EXAM: PORTABLE CHEST 1 VIEW COMPARISON:  02/01/2024 FINDINGS: 7.2 cm right lower lobe mass is again observed. No pneumothorax or pneumomediastinum. Low lung volumes. Thoracic spondylosis. Heart size within normal limits. IMPRESSION: 1. No pneumothorax or pneumomediastinum. 2. 7.2 cm right lower lobe mass. 3. Low lung volumes. Electronically Signed   By: Freida Jes M.D.   On: 02/16/2024 11:58   DG C-Arm 1-60 Min-No Report Result Date: 02/16/2024 Fluoroscopy was utilized by the requesting physician.  No radiographic interpretation.   MR BRAIN W WO CONTRAST Result Date: 02/09/2024 CLINICAL DATA:  Initial metastatic disease evaluation. EXAM: MRI HEAD WITHOUT AND WITH CONTRAST TECHNIQUE: Multiplanar, multiecho pulse sequences of the brain and surrounding structures were obtained without and with intravenous contrast. CONTRAST:  8mL GADAVIST  GADOBUTROL  1 MMOL/ML IV SOLN COMPARISON:  Comparison made with prior MRIs from 02/01/2024 and 02/05/2024. FINDINGS: Brain: Cerebral volume within normal limits. No significant cerebral white matter disease for age. No evidence for acute or subacute infarct. No areas of chronic cortical infarction. No acute or significant chronic intracranial blood products. Abnormal nodular thickening and enhancement seen about multiple cranial nerves at the skull base. Changes are most pronounced about the nerve root entry zones of the trigeminal nerves bilaterally (series 18, image 59). Prominent involvement of the seventh and 8 nerve cranial nerve complexes as well (series 18, image 48). Involvement of the hypoglossal nerves. Subtle nodular left  a meningeal enhancement seen about the cerebellum, most pronounced at the vermis (series 18, images 75, 68, 61). Thickening and enhancement about the pituitary stalk (series 18, image 73). 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 Mild associated edema within the brain parenchyma itself of the pons left middle cerebellar peduncle (series 11, image 18). No other significant mass effect or associated edema. No other mass lesion or abnormal enhancement. Ventricles normal size without hydrocephalus. No extra-axial fluid collections. No other  abnormal enhancement. Vascular: Major intracranial vascular flow voids are maintained. Skull and upper cervical spine: Craniocervical junction within normal limits. Bone marrow signal intensity within normal limits. No focal marrow replacing lesion. No scalp soft tissue abnormality. Sinuses/Orbits: Prior ocular lens replacement on the left. Questionable focus of asymmetric enhancement at the posterior aspect of the left lobe near the optic nerve insertion (series 18, image 63). Orbital soft tissues otherwise unremarkable. Paranasal sinuses are largely clear. No significant mastoid effusion. Other: None. IMPRESSION: 1. Abnormal nodular thickening and enhancement about multiple cranial nerves at the skull base and pituitary stalk, with additional subtle leptomeningeal enhancement about the cerebellum. 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 2. Mild associated edema within the brain parenchyma of the pons/left middle cerebellar peduncle. 3. Questionable focus of asymmetric enhancement at the posterior aspect of the left globe near the optic nerve insertion. Correlation with fundoscopic examination suggested. Additionally, attention at follow-up recommended. 4. No other acute intracranial abnormality. Electronically Signed   By: Virgia Griffins M.D.   On: 02/09/2024 20:52   MR  THORACIC SPINE W WO CONTRAST Result Date: 02/09/2024 CLINICAL DATA:  Initial metastatic disease evaluation. EXAM: MRI THORACIC WITHOUT AND WITH CONTRAST TECHNIQUE: Multiplanar and multiecho pulse sequences of the thoracic spine were obtained without and with intravenous contrast. CONTRAST:  8mL GADAVIST  GADOBUTROL  1 MMOL/ML IV SOLN COMPARISON:  Prior MRI from 02/05/2024. FINDINGS: Alignment: Trace scoliosis. Alignment otherwise normal with preservation of the normal thoracic kyphosis. No listhesis. Vertebrae: Vertebral body height maintained without acute or chronic fracture. Bone marrow signal intensity within normal limits. Benign hemangioma noted within the T3 vertebral body. No evidence for osseous metastatic disease. Degenerative reactive endplate change with marrow edema present about the T12-L1 interspace. No other abnormal marrow edema or enhancement. Cord: Few small nodular foci of enhancement seen about the visualized distal conus/cauda equina at the level of L1, concerning for leptomeningeal disease as seen on prior MRI of the lumbar spine (series 24, images 8, 9, 10). No other evidence for intracanalicular metastatic disease within the thoracic spine. Cord itself demonstrates normal signal and morphology. Paraspinal and other soft tissues: Paraspinous soft tissues within normal limits. Patient's known right lung mass partially visualized. Disc levels: T1-2: Negative interspace.  Mild facet hypertrophy.  No stenosis. T2-3: Minimal disc bulge. Mild facet hypertrophy. No spinal stenosis. Mild bilateral foraminal narrowing. T3-4:  Negative interspace.  Mild facet hypertrophy.  No stenosis. T4-5: Negative interspace. Mild facet hypertrophy. No spinal stenosis. Mild right foraminal narrowing. T5-6: Negative interspace. Mild facet hypertrophy. No significant stenosis. T6-7: Left paracentral disc protrusion indents the left ventral thecal sac. Mild facet hypertrophy. No stenosis. T7-8: Left paracentral disc  extrusion with inferior migration indents the ventral thecal sac. Flattening of the left ventral cord without cord signal changes or significant spinal stenosis. Foramina remain patent. T8-9: Left paracentral disc extrusion with possible superior migration. Flattening of the left ventral cord without cord signal changes or significant spinal stenosis. Foramina remain patent. T9-10: Left paracentral disc extrusion with superior migration. Flattening of the left ventral cord without cord signal changes or significant spinal stenosis. Mild bilateral facet hypertrophy with resultant mild right foraminal stenosis. Left neural foramina remains adequately patent. T10-11: Minimal disc bulge. Mild facet hypertrophy. No spinal stenosis. Mild right with moderate left foraminal stenosis. T11-12: Diffuse disc bulge with endplate spurring. Mild facet hypertrophy. No significant stenosis. T12-L1: Degenerative disc space narrowing diffuse disc bulge and disc desiccation. Reactive endplate spurring. Moderate facet hypertrophy. Resultant mild-to-moderate  spinal stenosis with moderate bilateral foraminal narrowing. IMPRESSION: 1. Few small nodular foci of enhancement about the visualized distal conus/cauda equina at the level of L1, concerning for leptomeningeal disease as seen on prior MRI of the lumbar spine. No other evidence for intracanalicular or osseous metastatic disease within the thoracic spine. 2. Solid right lung mass, partially visualized, and better characterized on recent chest CT. 3. 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 4. Multifactorial degenerative changes with resultant multilevel foraminal narrowing as above. Notable findings include mild right foraminal stenosis at T4-5 and T9-10, with moderate left foraminal narrowing at T10-11. Electronically Signed   By: Virgia Griffins M.D.   On: 02/09/2024 18:31   MR CERVICAL SPINE W WO CONTRAST Result Date:  02/09/2024 CLINICAL DATA:  Initial evaluation for metastatic disease evaluation. EXAM: MRI CERVICAL SPINE WITHOUT AND WITH CONTRAST TECHNIQUE: Multiplanar and multiecho pulse sequences of the cervical spine, to include the craniocervical junction and cervicothoracic junction, were obtained without and with intravenous contrast. CONTRAST:  8mL GADAVIST  GADOBUTROL  1 MMOL/ML IV SOLN COMPARISON:  Prior MRI from 02/05/2024. FINDINGS: Alignment: Mild dextroscoliosis with straightening of the normal cervical lordosis. Trace degenerative anterolisthesis of C2 on C3 and C3 on C4. Vertebrae: Vertebral body height maintained without acute or chronic fracture. Bone marrow signal intensity within normal limits. Small benign hemangioma noted within the T3 vertebral body. No evidence for osseous metastatic disease. No abnormal marrow edema or enhancement. Cord: Normal signal and morphology. No evidence for intracanalicular metastatic disease within the cervical spine. Posterior Fossa, vertebral arteries, paraspinal tissues: Abnormal thickening and enhancement seen about multiple cranial nerves at the visualized skull base (series 12, images 6, 15). 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 Degenerative thickening noted about the tectorial membrane without significant stenosis at the craniocervical junction. Paraspinous soft tissues within normal limits. Normal flow voids seen within the vertebral arteries bilaterally. Disc levels: C2-C3: Trace anterolisthesis. Mild uncovertebral spurring without significant disc bulge. Moderate left with mild right facet hypertrophy. No significant spinal stenosis. Mild left C3 foraminal narrowing. Right neural foramina remains patent. C3-C4: Trace anterolisthesis. Small left paracentral disc protrusion mildly indents the ventral thecal sac. Left greater than right uncovertebral and facet hypertrophy. Mild spinal stenosis. Severe left  with mild right C4 foraminal narrowing. C4-C5: Disc bulge with moderate right with mild left C5 foraminal stenosis. C5-C6: Degenerative disc space narrowing with diffuse disc osteophyte complex. Flattening and partial effacement of the ventral thecal sac. Superimposed facet hypertrophy. Moderate spinal stenosis with severe right worse than left C6 foraminal narrowing. C6-C7: Degenerative disc space narrowing with diffuse disc osteophyte complex. Flattening and partial effacement of the ventral thecal sac. Superimposed mild facet hypertrophy. Moderate spinal stenosis with severe bilateral C7 foraminal narrowing. C7-T1: Tiny left paracentral disc protrusion. Mild facet hypertrophy. No spinal stenosis. Foramina remain adequately patent. IMPRESSION: 1. 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 2. No other evidence for osseous or intracanalicular metastatic disease within the cervical spine. 3. 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 4. Multifactorial degenerative changes with resultant multilevel foraminal narrowing as above. Notable findings include severe left C4 foraminal stenosis, moderate right C5 foraminal narrowing, severe bilateral C6 and C7 foraminal stenosis, with mild left C3 foraminal narrowing. Electronically Signed   By: Virgia Griffins M.D.   On: 02/09/2024 18:19   MR Lumbar Spine W  Wo Contrast Result Date: 02/05/2024 CLINICAL DATA:  Lung mass. Prostate cancer. Melanoma. Abnormal PET scan. EXAM: MRI LUMBAR SPINE WITHOUT AND WITH CONTRAST TECHNIQUE: Multiplanar and multiecho pulse sequences of the lumbar spine were obtained without and with intravenous contrast. CONTRAST:  7.5mL GADAVIST  GADOBUTROL  1 MMOL/ML IV SOLN COMPARISON:  Whole body PET scan 02/04/2024 FINDINGS: Segmentation: 5  non rib-bearing lumbar type vertebral bodies are present. The lowest fully formed vertebral body is L5. Alignment: Slight retrolisthesis present at L1-2 and L2-3. Grade 1 anterolisthesis is present at L5-S1. Straightening of the normal lumbar lordosis is present. Levoconvex curvature is centered at L4-5. Vertebrae: Mixed type 1 and type 2 Modic changes are present at L3-4. Edematous changes are present on the right. More chronic type 2 Modic changes are present posteriorly on the right at L5-S1 and posteriorly on the right at L2-3. Edematous endplate changes are present posteriorly at L1-2. Vertebral body heights are maintained. No osseous metastases are present. Conus medullaris and cauda equina: Conus extends to the L2 level. Diffuse dural enhancement is present. Nodular enhancement is present within the conus medullaris. A dorsal and right lateral nodule is present at L2 adjacent to the conus. Ventral nodule and posterior dural enhancement is present at the L2-3 level. A dorsal or dural nodule is present at L4. A 7 mm enhancing nodule is present laterally at the L4-5 disc level, corresponding to the traversing left L5 nerve roots. Paraspinal and other soft tissues: An 18 mm simple cyst is noted at the lower pole of the right kidney. No other solid organ lesions are present. No significant adenopathy is present. Disc levels: T12-L1: Mild disc bulging is present without focal stenosis. L1-2: A right paramedian disc protrusion is present. Mild central canal stenosis is present. Disc extends into the foramina bilaterally. Moderate bilateral foraminal stenosis is present. L2-3: A leftward disc protrusion is present. Moderate facet hypertrophy is seen bilaterally. This results an central canal stenosis with moderate left and mild right subarticular narrowing. Moderate foraminal narrowing is worse on the left. L3-4: A broad-based disc protrusion is present. Moderate facet hypertrophy is noted bilaterally. This results  in moderate to severe central canal stenosis. Severe right and moderate left foraminal stenosis is present. L4-5: A broad-based disc protrusion is present. Moderate facet hypertrophy is noted bilaterally. Moderate foraminal narrowing is present bilaterally. L5-S1: Chronic loss of disc height is present. Moderate facet hypertrophy is worse on the right. Mild right subarticular narrowing is present. Moderate foraminal stenosis is worse right than left. IMPRESSION: 1. Diffuse dural enhancement with nodular enhancement within the conus medullaris. 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 2. 7 mm enhancing nodule laterally at the L4-5 disc level, corresponding to the traversing left L5 nerve roots. 3. No osseous metastases. 4. Multilevel spondylosis of the lumbar spine as described. 5. Mild central canal stenosis at L1-2. 6. Moderate bilateral foraminal stenosis at L1-2. 7. Moderate left and mild right subarticular narrowing at L2-3. 8. Moderate to severe central canal stenosis at L3-4 with severe right and moderate left foraminal stenosis. 9. Moderate foraminal narrowing bilaterally at L4-5 and L5-S1. These results will be called to the ordering clinician or representative by the Radiologist Assistant, and communication documented in the PACS or [employer]. Electronically Signed   By: Audree Leas M.D.   On: 02/05/2024 19:41   NM PET Image Initial (PI) Skull Base To Thigh (F-18 FDG) Result Date: 02/04/2024 CLINICAL DATA:  Initial treatment strategy for lung nodule. Additional history of prostate  cancer and melanoma. EXAM: NUCLEAR MEDICINE PET SKULL BASE TO THIGH TECHNIQUE: 10.31 mCi F-18 FDG was injected intravenously. Full-ring PET imaging was performed from the skull base to thigh after the radiotracer. CT data was obtained and used for attenuation correction and anatomic localization. Fasting blood glucose: 148 mg/dl COMPARISON:  PET-CT report November 2015.  Chest CT  02/01/2024 FINDINGS: Mediastinal blood pool activity: SUV max 3.1 Liver activity: SUV max 3.4 NECK: No specific abnormal radiotracer uptake seen including along lymph node chains of the submandibular, posterior triangle or internal jugular region. Near symmetric uptake of the visualized intracranial compartment. Incidental CT findings: Visualized paranasal sinuses and mastoid air cells are clear. Streak artifact related to patient's dental hardware. The parotid glands, submandibular glands and thyroid  glands unremarkable. CHEST: Large heterogeneous right lower lobe lung mass again identified corresponding to the 6.7 cm lesion seen on the recent CT scan of the chest with maximum SUV value of 21.1 worrisome for neoplasm. No additional areas of abnormal uptake along the lung parenchyma. There is some mild uptake along the right infrahilar region which may be small nodes. The right lower lobe mass as abut the hilum these nodes have maximum SUV value of 8.2. No additional areas of abnormal uptake elsewhere in the chest including mediastinum, left hilum or axillary regions. The subcarinal nodes described on the CT scan have lower level of uptake with maximum SUV of 3.4. Incidental CT findings: Scattered vascular calcifications are seen including along the coronary arteries. No pericardial effusion. The thoracic aorta is normal course and caliber. Breathing motion. No pleural effusion or pneumothorax. Calcified tiny nodules in the left lung are again noted as on prior. ABDOMEN/PELVIS: There is physiologic distribution radiotracer along the parenchymal organs, bowel and renal collecting systems. Incidental CT findings: Nonobstructing midportion left-sided renal stone. Right-sided central renal cyst. Contracted urinary bladder. Large bowel has a normal course and caliber with scattered stool. Left-sided colonic diverticula. Normal appendix. Gallbladder is nondilated with dependent stones. Nonspecific perinephric stranding,  right greater than left. SKELETON: Focal area of abnormal uptake seen along the left hip greater trochanter with faint area of sclerosis. Maximum SUV value of 8.4 worrisome for a skeletal metastasis. Is also focus of uptake with maximum SUV value of 8.4 corresponding to the central canal of the spine at the L3-4 level. Small soft tissue nodule in this location on CT image 104. This has a differential. Incidental CT findings: Scattered degenerative changes along the spine. Multilevel degenerative changes particularly with stenosis in the lumbar region. Trace listhesis. Degenerative changes as well of the shoulders and pelvis. IMPRESSION: Large hypermetabolic right lower lobe lung mass as seen on prior CT exam scanned extending to the hilum consistent with neoplasm. Uptake in the right hilum could represent some adjacent nodes. Hypermetabolic sclerotic lesion involving the left hip greater trochanter worrisome for a skeletal metastasis. Focus area of uptake along the central canal of the spine at L3-4 left of midline is also seen. This would have a differential. An additional metastatic lesion is 1 of those differential. Recommend further workup with lumbar spine MRI with and without contrast to further delineate. Electronically Signed   By: Adrianna Horde M.D.   On: 02/04/2024 11:56   CT Chest W Contrast Result Date: 02/01/2024 CLINICAL DATA:  Respiratory illness, nondiagnostic xray Ongoing cough post COVID infection with difficulty swelling for 1 month. Right lung mass on radiographs concerning for malignancy. History of melanoma. * Tracking Code: BO * EXAM: CT CHEST WITH CONTRAST TECHNIQUE: Multidetector CT  imaging of the chest was performed during intravenous contrast administration. RADIATION DOSE REDUCTION: This exam was performed according to the departmental dose-optimization program which includes automated exposure control, adjustment of the mA and/or kV according to patient size and/or use of iterative  reconstruction technique. CONTRAST:  75mL OMNIPAQUE  IOHEXOL  300 MG/ML  SOLN COMPARISON:  Chest radiographs same date. No other recent imaging. Remote PET-CT 09/05/2014. FINDINGS: Cardiovascular: No acute vascular findings. There is atherosclerosis of the aorta, great vessels and coronary arteries. There are calcifications of the aortic valve. The heart size is normal. There is no pericardial effusion. Mediastinum/Nodes: There are newly enlarged right hilar and subcarinal lymph nodes, including a 1.4 cm short axis subcarinal node on image 73/2. Right hilar lymph nodes measuring up to 1.7 x 1.0 cm on image 66/2 are partially calcified. Stable postsurgical changes in the right axilla from previous axillary node dissection. No enlarged axillary lymph nodes are identified. The thyroid  gland, trachea and esophagus demonstrate no significant findings. Lungs/Pleura: No pleural effusion or pneumothorax. As demonstrated on earlier chest radiographs, there is a large well-circumscribed solid appearing mass in the right lower lobe which measures approximately 6.7 x 6.0 cm on image 82/4. Mild surrounding posterior obstructive pneumonitis and central airway thickening. No other suspicious pulmonary nodules are identified. There are small calcified granulomas adjacent to the left major fissure. Upper abdomen: Diffuse hepatic steatosis. No focal abnormalities identified. There is no evidence of adrenal mass. Musculoskeletal/Chest wall: There is no chest wall mass or suspicious osseous finding. Mild spondylosis. IMPRESSION: 1. Large well-circumscribed solid appearing mass in the right lower lobe with mild surrounding posterior obstructive pneumonitis and central airway thickening. Findings are not typical for pneumonia and are concerning for malignancy. Differential includes primary bronchogenic carcinoma and metastatic melanoma given the patient's history. 2. Newly enlarged right hilar and subcarinal lymph nodes, suspicious for  metastatic disease. PET-CT may be helpful for further evaluation. 3. No other evidence of metastatic disease. 4. Hepatic steatosis. 5. Aortic atherosclerosis. Electronically Signed   By: Elmon Hagedorn M.D.   On: 02/01/2024 18:41   MR BRAIN WO CONTRAST Result Date: 02/01/2024 CLINICAL DATA:  Neuro deficit, acute, stroke suspected. Facial numbness and difficulty swallowing. EXAM: MRI HEAD WITHOUT CONTRAST TECHNIQUE: Multiplanar, multiecho pulse sequences of the brain and surrounding structures were obtained without intravenous contrast. COMPARISON:  Head CT 02/01/2024 FINDINGS: Brain: There is no evidence of an acute infarct, intracranial hemorrhage, mass, midline shift, or extra-axial fluid collection. Cerebral volume is normal. The ventricles are normal in size. No significant white matter disease is seen for age. Vascular: Major intracranial vascular flow voids are preserved. Skull and upper cervical spine: Unremarkable brain marrow signal. Sinuses/Orbits: Left cataract extraction. Minimal mucosal thickening or small mucous retention cysts in the maxillary sinuses. Clear mastoid air cells. Other: None. IMPRESSION: Unremarkable appearance of the brain for age. Electronically Signed   By: Aundra Lee M.D.   On: 02/01/2024 16:15   DG Chest 2 View Result Date: 02/01/2024 CLINICAL DATA:  Ongoing cough post COVID. EXAM: CHEST - 2 VIEW COMPARISON:  PET-CT 09/05/2014 FINDINGS: Normal cardiac and mediastinal contours. There is a 7.7 x 6.6 cm mass projecting over the right lower lung. Minimal heterogeneous opacities left lung base. Surgical clips right axilla. Thoracic spine degenerative changes. IMPRESSION: 1. 7.7 cm mass projecting over the right lower lung. Possibility of malignant process not excluded. Less likely findings represent infectious process. Recommend further evaluation with chest CT. 2. Minimal heterogeneous opacities left lung base may represent atelectasis or  infection. Electronically Signed   By:  Jone Neither M.D.   On: 02/01/2024 12:37   CT Head Wo Contrast Result Date: 02/01/2024 CLINICAL DATA:  Neuro deficit, concern for stroke, difficulty chewing and swallowing, facial numbness for 1 month. EXAM: CT HEAD WITHOUT CONTRAST TECHNIQUE: Contiguous axial images were obtained from the base of the skull through the vertex without intravenous contrast. RADIATION DOSE REDUCTION: This exam was performed according to the departmental dose-optimization program which includes automated exposure control, adjustment of the mA and/or kV according to patient size and/or use of iterative reconstruction technique. COMPARISON:  None Available. FINDINGS: Brain: No acute intracranial hemorrhage. No CT evidence of acute infarct. No edema, mass effect, or midline shift. The basilar cisterns are patent. Ventricles: The ventricles are normal. Vascular: Atherosclerotic calcifications of the carotid siphons. No hyperdense vessel. Skull: No acute or aggressive finding. Orbits: Left lens replacement.  Orbits are otherwise unremarkable. Sinuses: The visualized paranasal sinuses are clear. Other: Mastoid air cells are clear. IMPRESSION: No CT evidence of acute intracranial abnormality. Electronically Signed   By: Denny Flack M.D.   On: 02/01/2024 10:53    PERFORMANCE STATUS (ECOG) : 4 - Bedbound  Review of Systems Unable to provide  Physical Exam General: Ill-appearing Pulmonary: Unlabored Extremities: no edema, no joint deformities Skin: no rashes Neurological: Poorly responsive  IMPRESSION: Follow-up visit.  Patient was seen earlier today by radiation oncology with recommendation for hospice.  I met with patient's wife and daughters.  They say that patient, in a moment of lucidity, told them "no more" and that he did not have long to live.  They feel that patient would opt for comfort care and hospice at this point, which we discussed in detail.  However, unclear if patient is stable enough to transfer either  home or to hospice facility.  Will proceed with comfort care and reevaluate tomorrow.  Wife verbalized agreement with DNR/DNI.  PLAN: -Comfort care - DNR/DNI  Case and plan discussed with Dr. Randy Buttery  Time Total: 45 minutes  Visit consisted of counseling and education dealing with the complex and emotionally intense issues of symptom management and palliative care in the setting of serious and potentially life-threatening illness.Greater than 50%  of this time was spent counseling and coordinating care related to the above assessment and plan.  Signed by: Gerilyn Kobus, PhD, NP-C

## 2024-03-01 NOTE — Progress Notes (Signed)
 Brief Nutrition Note  Noted patient admitted with aspiration pneumonia.  Reviewed inpatient notes.   Briefly spoke with wife by phone.  Wife confirmed receiving nutren 1.5 on Sunday from [facility].    Spoke with inpatient RD team as well. Planning on starting trickle feeds today and assess tolerance.  Will follow-up on discharge.    Cyrena Kuchenbecker B. Zollie Hipp, CSO, LDN Registered Dietitian [phone]

## 2024-03-01 NOTE — Progress Notes (Signed)
 Initial Nutrition Assessment  DOCUMENTATION CODES:   Severe malnutrition in context of chronic illness  INTERVENTION:   -TF via PEG:  Initiate Osmolite 1.5 @ 20 ml/hr  Once able to advance, increase by 10 ml every 12 hours to goal rate of 60 ml/hr.   30 ml free water  flush every 4 hours  Tube feeding regimen provides 2160 kcal (100% of needs), 90 grams of protein, and 1097 ml of H2O. 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  Pt admitted with aspiration pneumonia.   Reviewed I/O's: +2.1 L x 24 hours  Case discussed with cancer center RD, who is familiar to this pt. Pt underwent PEG placement on 02/23/24. Pt was started on samples of Osmolite 1.5 (goals of 6 cartons daily), however, has not been able to tolerate volumes. He has TF supplies through [facility] and is using Nutren 1.5 formula.   Spoke with pt, wife and 2 daughter at bedside. Pt currently hiccupping, which wife states is a common occurrence with him. Wife confirms that pt does not take  anything by mouth and has been dependent on PEG. Pt has been struggling with PEG feedings, as he has not been able to tolerate recommended volumes of bolus feedings. Last feeding was on Sunday, 02/28/24, in which pt was able to take 1/4 of a carton of Nutren 1.5 and a water  flush.   Pt with weight loss. Pt has experienced a 12.7% wt loss over the past month, which is significant for time frame.   Case discussed with Dr. Mauri Sous (general surgery); confirmed plan for continuous trickle feeds only today. May advance tomorrow pending on pt progress.   RD discussed plan to start trickle feeds today and plan for potential advancement tomorrow if pt tolerates. Family agreeable to plan. Pt is also at high refeeding risk. Will check labs and add thiamine and MVI to minimize risk.   Palliative care following for goals of care discussions. Radiation oncology saw today; simulation has been scheduled tentatively for next week, however, would like performance status to improve prior to initiating radiation.   Concerns discussed with RN, MD, general surgery, and TOC. Pending clinical course, pt may require continuous feedings at discharge and would require feeding pump.   Medications reviewed and include lovenox , protonix , thiamine, and 0.9% sodium chloride  infusion @ 100 ml/hr.   Lab Results  Component Value Date   HGBA1C 5.4 12/14/2023   PTA DM medications are 500 mg metformin  daily.   Labs reviewed: K: 2.8, Mg, and Phos WDL. CBGS: 128-180 (inpatient orders for glycemic control are 0-15 units insulin  aspart TID with meals).    NUTRITION -  FOCUSED PHYSICAL EXAM:  Flowsheet Row Most Recent Value  Orbital Region Mild depletion  Upper Arm Region Moderate depletion  Thoracic and Lumbar Region Mild depletion  Buccal Region Mild depletion  Temple Region Moderate depletion  Clavicle Bone Region Severe depletion  Clavicle and Acromion Bone Region Severe depletion  Scapular Bone Region Severe depletion   Dorsal Hand Moderate depletion  Patellar Region Severe depletion  Anterior Thigh Region Severe depletion  Posterior Calf Region Severe depletion  Edema (RD Assessment) None  Hair Reviewed  Eyes Reviewed  Mouth Reviewed  Skin Reviewed  Nails Reviewed       Diet Order:   Diet Order             Diet NPO time specified  Diet effective now                   EDUCATION NEEDS:   Education needs have been addressed  Skin:  Skin Assessment: Reviewed RN Assessment  Last BM:  Unknown  Height:   Ht Readings from Last 1 Encounters:  02/29/24 5\' 6"  (1.676 m)    Weight:   Wt Readings from Last 1 Encounters:  02/29/24 77.3 kg    Ideal Body Weight:  64.5 kg  BMI:  Body mass index is 27.52 kg/m.  Estimated Nutritional Needs:   Kcal:  2000-2200  Protein:  90-105 grams  Fluid:  2.0-2.2 L    Herschel Lords, RD, LDN, CDCES Registered Dietitian III Certified Diabetes Care and Education Specialist If unable to reach this RD, please use "RD Inpatient" group chat on secure chat between hours of 8am-4 pm daily

## 2024-03-01 NOTE — Progress Notes (Signed)
 PROGRESS NOTE    Mason Stewart  HQI:[id number] DOB: 24-Oct-1950 DOA: 02/29/2024 PCP: Helaine Llanos, MD    Brief Narrative:   73 y.o. 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   Patient was diagnosed with metastatic melanoma (recurrent disease, was initially diagnosed with melanoma in 2015), workup has found so far patient had major metastatic lesions in right lower lobe lung as well as leptomeningeal metastatic disease/carcinomatosis and patient was started on immunotherapy last week.  Patient had 4 session of immunotherapy on Friday and had a few vomiting but symptoms subsided the same day.  At baseline patient also has a worsening of dysphagia, last week patient also underwent PEG tube insertion and started on feeding.  Over the weekend, patient family found patient has very poor tolerance to the tube feeding, and has had frequent feeling of nauseous vomiting.  Overnight patient started to have worsening of cough and family found copious amount of secretions in his mouth and oxygen level dropped, and patient more confused.   Assessment & Plan:   Principal Problem:   Aspiration pneumonia (HCC) Active Problems:   Dysphagia   Sepsis (HCC)   Acute respiratory failure with hypoxia (HCC)   SOB (shortness of breath)   Palliative care encounter  Acute hypoxic respiratory failure RML and RLL aspiration pneumonia, bacterial Patient has a PEG tube in place but appears his risk of aspiration remains quite high.  There is evidence of aspiration of the right middle and right lower lobes.  Etiology of aspiration likely secondary to pharyngeal dysfunction in the  setting of metastatic brain lesion.  Prognosis guarded. Plan: Zosyn  Wean oxygen as tolerated Aspiration precautions Monitor vitals and fever curve Follow culture data  Free air in the peritoneal cavity - Abdomen benign on palpation bowel sounds normal.  No significant leakage around the PEG tube insertion site.  Case discussed with general surgery Dr. Mauri Sous.  No urgent surgical intervention warranted.  Will contact RD for trickle feeds   Acute metabolic encephalopathy Likely in the setting of sepsis.  Treatment as above and reevaluate   Severe sepsis, with acute endorgan damage - Sepsis evidenced by hypoxia leukocytosis, source of infection is right-sided aspiration pneumonia and management as above.  Blood pressure was low and requiring IV boluses in the ED.  Acute endorgan damage including metabolic encephalopathy.  Antibiotics as above   Elevated glucose - Unknown etiology, most recent A1c 2 months ago was 5.5 Possibly stress reaction  Hypokalemia Monitor and replace as needed   Metastatic melanoma, recurrent - Prognosis not entirely clear at this point as patient was just started on immunotherapy - Imaging studies so far has showed multiple metastatic sites including brain right side of the lungs, left hip, L3-4.  Prognosis guarded.  Recommend consideration for DNR.  Oncology palliative care following    DVT prophylaxis: Lovenox  Code Status: Full Family Communication: Wife at bedside 5/20 Disposition Plan: Status is: Inpatient Remains inpatient appropriate because: Multiple acute issues as above   Level of care: Telemetry Medical  Consultants:  Palliative care  Procedures:  None  Antimicrobials: Zosyn    Subjective: Seen and examined.  Appears weak and fatigued.  Ill-appearing.  Wife at bedside.  Objective: Vitals:   03/01/24 0605 03/01/24  1000 03/01/24 1312 03/01/24 1332  BP: (!) 156/83 (!) 151/78  (!) 172/90  Pulse: 96  97 92  Resp: 20 13 17 18   Temp:  98.3 F (36.8 C)  98 F (36.7 C) 98.9 F (37.2 C)  TempSrc: Oral  Axillary   SpO2: 100%  96% 98%  Weight:      Height:        Intake/Output Summary (Last 24 hours) at 03/01/2024 1441 Last data filed at 03/01/2024 0406 Gross per 24 hour  Intake 2121.89 ml  Output --  Net 2121.89 ml   Filed Weights   02/29/24 0553  Weight: 77.3 kg    Examination:  General exam: Ill-appearing Respiratory system: Coarse close diffusely bilaterally.  Increased work of breathing.  3 L Cardiovascular system: Tachycardic, regular rhythm, no murmurs Gastrointestinal system: Soft, nontender, nondistended, normal bowel sounds, PEG Central nervous system: Alert wake.  Unable to assess orientation.  No focal deficits Extremities: Symmetrically decreased bilaterally Skin: No rashes, lesions or ulcers Psychiatry: Judgement and insight appear impaired. Mood & affect confused.     Data Reviewed: I have personally reviewed following labs and imaging studies  CBC: Recent Labs  Lab 02/26/24 1305 02/29/24 0557 03/01/24 0432  WBC 13.1* 34.3* 18.3*  NEUTROABS 10.8* 31.0*  --   HGB 12.5* 13.9 11.6*  HCT 34.4* 38.2* 32.0*  MCV 84.9 85.1 85.3  PLT 296 372 252   Basic Metabolic Panel: Recent Labs  Lab 02/26/24 1255 02/26/24 1304 02/29/24 0557 02/29/24 0756 03/01/24 0432  NA  --  131* 134*  --  135  K  --  3.4* 3.2*  --  2.8*  CL  --  95* 95*  --  102  CO2  --  27 27  --  23  GLUCOSE  --  148* 195*  --  143*  BUN  --  14 17  --  12  CREATININE  --  0.70 0.69  --  0.54*  CALCIUM   --  8.8* 9.2  --  7.8*  MG 2.1  --   --  1.7  --   PHOS 3.0  --   --  2.9  --    GFR: Estimated Creatinine Clearance: 80.5 mL/min (A) (by C-G formula based on SCr of 0.54 mg/dL (L)). Liver Function Tests: Recent Labs  Lab 02/26/24 1304 02/29/24 0557  AST 19 18  ALT 14 16  ALKPHOS 77 76  BILITOT 1.1 2.1*  PROT 7.1 7.4  ALBUMIN 3.5 3.7   No results for input(s): "LIPASE", "AMYLASE" in the last 168 hours. No  results for input(s): "AMMONIA" in the last 168 hours. Coagulation Profile: No results for input(s): "INR", "PROTIME" in the last 168 hours. Cardiac Enzymes: No results for input(s): "CKTOTAL", "CKMB", "CKMBINDEX", "TROPONINI" in the last 168 hours. BNP (last 3 results) No results for input(s): "PROBNP" in the last 8760 hours. HbA1C: No results for input(s): "HGBA1C" in the last 72 hours. CBG: Recent Labs  Lab 02/29/24 1117 02/29/24 1616 02/29/24 2316 03/01/24 0727 03/01/24 1132  GLUCAP 180* 136* 146* 134* 128*   Lipid Profile: No results for input(s): "CHOL", "HDL", "LDLCALC", "TRIG", "CHOLHDL", "LDLDIRECT" in the last 72 hours. Thyroid  Function Tests: No results for input(s): "TSH", "T4TOTAL", "FREET4", "T3FREE", "THYROIDAB" in the last 72 hours. Anemia Panel: No results for input(s): "VITAMINB12", "FOLATE", "FERRITIN", "TIBC", "IRON", "RETICCTPCT" in the last 72 hours. Sepsis Labs: Recent Labs  Lab 02/29/24 0557  LATICACIDVEN 1.8    Recent Results (from the past 240  hours)  Culture, blood (routine x 2)     Status: None (Preliminary result)   Collection Time: 02/29/24  6:40 AM   Specimen: BLOOD  Result Value Ref Range Status   Specimen Description BLOOD RIGHT HAND  Final   Special Requests   Final    BOTTLES DRAWN AEROBIC AND ANAEROBIC Blood Culture results may not be optimal due to an inadequate volume of blood received in culture bottles   Culture   Final    NO GROWTH 1 DAY Performed at [facility], [address] [place], [place] [address]    Report Status PENDING  Incomplete  Culture, blood (routine x 2)     Status: None (Preliminary result)   Collection Time: 02/29/24  6:41 AM   Specimen: BLOOD  Result Value Ref Range Status   Specimen Description BLOOD RIGHT FA  Final   Special Requests   Final    BOTTLES DRAWN AEROBIC AND ANAEROBIC Blood Culture results may not be optimal due to an inadequate volume of blood received in culture bottles    Culture   Final    NO GROWTH 1 DAY Performed at [facility], [address] [place], [place] [address]    Report Status PENDING  Incomplete  MRSA Next Gen by PCR, Nasal     Status: None   Collection Time: 02/29/24 10:30 AM   Specimen: Nasal Mucosa; Nasal Swab  Result Value Ref Range Status   MRSA by PCR Next Gen NOT DETECTED NOT DETECTED Final    Comment: (NOTE) The GeneXpert MRSA Assay (FDA approved for NASAL specimens only), is one component of a comprehensive MRSA colonization surveillance program. It is not intended to diagnose MRSA infection nor to guide or monitor treatment for MRSA infections. Test performance is not FDA approved in patients less than 66 years old. Performed at [facility], [address], [place], [place] [address]          Radiology Studies: CT Angio Chest PE W and/or Wo Contrast Result Date: 02/29/2024 CLINICAL DATA:  Known right lower lobe mass/neoplasm, PET positive, presenting with increased weakness and hypoxia with increased work of breathing. The patient had an open gastrostomy creation 02/23/2024. EXAM: CT ANGIOGRAPHY CHEST CT ABDOMEN AND PELVIS WITH CONTRAST TECHNIQUE: Multidetector CT imaging of the chest was performed using the standard protocol during bolus administration of intravenous contrast. Multiplanar CT image reconstructions and MIPs were obtained to evaluate the vascular anatomy. Multidetector CT imaging of the abdomen and pelvis was performed using the standard protocol during bolus administration of intravenous contrast. RADIATION DOSE REDUCTION: This exam was performed according to the departmental dose-optimization program which includes automated exposure control, adjustment of the mA and/or kV according to patient size and/or use of iterative reconstruction technique. CONTRAST:  OMNIPAQUE  IOHEXOL  350 MG/ML SOLN COMPARISON:  Chest CT without contrast 02/15/2024, PET-CT 02/04/2024, and a portable chest from  02/23/2024 and today. FINDINGS: CTA CHEST FINDINGS Cardiovascular: There is a new small anterior pericardial effusion. The cardiac size is normal. There are left main and 2 vessel coronary calcifications in the LAD and right coronary artery, heaviest in the LAD. There is a new left chest implanted port, with subclavian approach catheter terminating at about the brachiocephalic/SVC junction. There is atherosclerosis in the aorta and great vessels without aneurysm, dissection or stenosis. No pulmonary arterial dilatation or embolism is seen. Pulmonary veins are normal caliber. There is bronchovascular mass encasement and narrowing along the lower hilar area in the lower lobe.  Mediastinum/Nodes: There are increasingly prominent lymph nodes in the azygoesophageal recess, largest 1.5 x 2.5 cm on 2:90. There are increasingly prominent right lower hilar lymph nodes difficult to separate out from the right lower lobe mass. Largest of these is estimated 1.9 cm short axis on 2:91. There is an enlarging right mid hilar node of 1.5 cm on 2: 76, additional subcarinal lymph nodes up to 1 cm. I do not see further adenopathy. Lungs/Pleura: There is moderate fluid consistent with aspiration in the trachea, tracking down into right middle and lower lobe bronchi. Fluid is also seen in the right upper lobe main bronchus and there is diffuse bronchial thickening. There are multiple subsegmental and a few segmental fluid opacified bronchi in the right lower lobe basal segments with the right lower lobe distal main bronchus narrowed by the tumor. There is scattered subsegmental bronchial impactions in the right middle lobe. Right lower lobe mass abutting the hilum is centered in the superior segment and measures 7 x 5.6 cm on 2:10, not larger than on 02/15/2024 but on 02/04/2024 measured 6.5 x 5.5 cm. There is patchy ground-glass airspace disease in the left upper lobe, patchy denser ground-glass intermixed with peribronchovascular  nodular airspace disease in the right upper lobe, ill-defined scattered hazy airspace disease in left lower lobe. In the right lower lobe, in addition to a fluid impactions in multiple subsegmental and downstream bronchi, there is patchy bronchopneumonia greatest in the superior segment, with patchy tree-in-bud and ground-glass airspace disease in the right middle lobe. Findings consistent with multilobar pneumonia. No fluid is seen in the left-sided bronchi. There are no pleural effusions. Musculoskeletal: Degenerative change thoracic spine. No regional bone metastasis. At T8-9 and T9-10 there small left paracentral calcified disc extrusions without overt mass effect. Review of the MIP images confirms the above findings. CT ABDOMEN and PELVIS FINDINGS Hepatobiliary: No liver metastasis is seen. Unremarkable gallbladder and bile ducts. Pancreas: No abnormality. Spleen: No abnormality. Adrenals/Urinary Tract: No adrenal renal mass enhancement. There is a 2 cm right lower pole parapelvic Bosniak 2 cyst with a Hounsfield density of 27, not requiring follow-up imaging. There is a parapelvic cyst in the lower left kidney which is too small to characterize consistent with another Bosniak 2 cyst. There is a 3 mm caliceal stone in the midpole of the left kidney. No other urolithiasis is seen and no obstructing stones. Normal bladder. Stomach/Bowel: There is a PEG tube in place. The balloon is in the stomach. There is moderate free air in the upper abdomen, more than expected 6 days out from PEG placement. A gastric leak is not excluded but there is no free fluid. Furthermore no focal inflammatory changes seen around the where the tube enters the stomach. Clinical correlation follow-up recommended. No bowel dilatation or wall thickening is seen. The appendix is normal. There is sigmoid diverticulosis, without diverticulitis. Vascular/Lymphatic: Aortic atherosclerosis. No enlarged abdominal or pelvic lymph nodes.  Reproductive: The prostate appears to have been removed. No mass is seen in the prostate bed. There are no surgical clips in the pelvic sidewalls. Other: None. Musculoskeletal: There is no focally destructive lesion. The PET-CT demonstrated abnormal activity in the posterior superior facet of the left greater trochanter concerning for metastatic disease, but which does not have a CT correlate. There is degenerative change of the lumbar spine most advanced at the lowest 2 levels where there is acquired spinal stenosis. 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 Review of the MIP images confirms the above findings.  IMPRESSION: 1. No evidence of pulmonary arterial dilatation or embolus. 2. 7 x 5.6 cm right lower lobe mass centered in the superior segment, not larger than on 02/15/2024 but slightly increased since 02/04/2024. 3. Aspiration fluid in the trachea, right middle and lower lobe bronchi, some in the right upper lobe main bronchus, with diffuse bronchial thickening and with multilobar pneumonia described in detail above, right greater than left. 4. Increasingly prominent mediastinal and right hilar lymph nodes. 5. New small anterior pericardial effusion. 6. Aortic and coronary artery atherosclerosis. 7. PEG tube in place with the balloon in the stomach. There is moderate free air in the upper abdomen more than expected 6 days out from PEG placement. A gastric leak is not excluded but there is no free fluid. Furthermore no focal inflammatory changes seen around the where the tube enters the stomach. Clinical correlation and follow-up recommended. 8. No evidence of metastatic disease in the abdomen or pelvis. 9. 3 mm nonobstructing left renal stone. 10. Diverticulosis without evidence of diverticulitis. 11. The PET-CT demonstrated abnormal activity in the posterior superior facet of the left greater trochanter concerning for metastatic disease, but which does not have a CT correlate. 12. Critical  Value/emergent results were called by telephone at the time of interpretation on 02/29/2024 at 7:36 am to provider DR. Cleora Daft, who verbally acknowledged these results. Aortic Atherosclerosis (ICD10-I70.0). Electronically Signed   By: Denman Fischer M.D.   On: 02/29/2024 08:11   CT ABDOMEN PELVIS W CONTRAST Result Date: 02/29/2024 CLINICAL DATA:  Known right lower lobe mass/neoplasm, PET positive, presenting with increased weakness and hypoxia with increased work of breathing. The patient had an open gastrostomy creation 02/23/2024. EXAM: CT ANGIOGRAPHY CHEST CT ABDOMEN AND PELVIS WITH CONTRAST TECHNIQUE: Multidetector CT imaging of the chest was performed using the standard protocol during bolus administration of intravenous contrast. Multiplanar CT image reconstructions and MIPs were obtained to evaluate the vascular anatomy. Multidetector CT imaging of the abdomen and pelvis was performed using the standard protocol during bolus administration of intravenous contrast. RADIATION DOSE REDUCTION: This exam was performed according to the departmental dose-optimization program which includes automated exposure control, adjustment of the mA and/or kV according to patient size and/or use of iterative reconstruction technique. CONTRAST:  OMNIPAQUE  IOHEXOL  350 MG/ML SOLN COMPARISON:  Chest CT without contrast 02/15/2024, PET-CT 02/04/2024, and a portable chest from 02/23/2024 and today. FINDINGS: CTA CHEST FINDINGS Cardiovascular: There is a new small anterior pericardial effusion. The cardiac size is normal. There are left main and 2 vessel coronary calcifications in the LAD and right coronary artery, heaviest in the LAD. There is a new left chest implanted port, with subclavian approach catheter terminating at about the brachiocephalic/SVC junction. There is atherosclerosis in the aorta and great vessels without aneurysm, dissection or stenosis. No pulmonary arterial dilatation or embolism is seen. Pulmonary  veins are normal caliber. There is bronchovascular mass encasement and narrowing along the lower hilar area in the lower lobe. Mediastinum/Nodes: There are increasingly prominent lymph nodes in the azygoesophageal recess, largest 1.5 x 2.5 cm on 2:90. There are increasingly prominent right lower hilar lymph nodes difficult to separate out from the right lower lobe mass. Largest of these is estimated 1.9 cm short axis on 2:91. There is an enlarging right mid hilar node of 1.5 cm on 2: 76, additional subcarinal lymph nodes up to 1 cm. I do not see further adenopathy. Lungs/Pleura: There is moderate fluid consistent with aspiration in the trachea, tracking down into right middle  and lower lobe bronchi. Fluid is also seen in the right upper lobe main bronchus and there is diffuse bronchial thickening. There are multiple subsegmental and a few segmental fluid opacified bronchi in the right lower lobe basal segments with the right lower lobe distal main bronchus narrowed by the tumor. There is scattered subsegmental bronchial impactions in the right middle lobe. Right lower lobe mass abutting the hilum is centered in the superior segment and measures 7 x 5.6 cm on 2:10, not larger than on 02/15/2024 but on 02/04/2024 measured 6.5 x 5.5 cm. There is patchy ground-glass airspace disease in the left upper lobe, patchy denser ground-glass intermixed with peribronchovascular nodular airspace disease in the right upper lobe, ill-defined scattered hazy airspace disease in left lower lobe. In the right lower lobe, in addition to a fluid impactions in multiple subsegmental and downstream bronchi, there is patchy bronchopneumonia greatest in the superior segment, with patchy tree-in-bud and ground-glass airspace disease in the right middle lobe. Findings consistent with multilobar pneumonia. No fluid is seen in the left-sided bronchi. There are no pleural effusions. Musculoskeletal: Degenerative change thoracic spine. No regional  bone metastasis. At T8-9 and T9-10 there small left paracentral calcified disc extrusions without overt mass effect. Review of the MIP images confirms the above findings. CT ABDOMEN and PELVIS FINDINGS Hepatobiliary: No liver metastasis is seen. Unremarkable gallbladder and bile ducts. Pancreas: No abnormality. Spleen: No abnormality. Adrenals/Urinary Tract: No adrenal renal mass enhancement. There is a 2 cm right lower pole parapelvic Bosniak 2 cyst with a Hounsfield density of 27, not requiring follow-up imaging. There is a parapelvic cyst in the lower left kidney which is too small to characterize consistent with another Bosniak 2 cyst. There is a 3 mm caliceal stone in the midpole of the left kidney. No other urolithiasis is seen and no obstructing stones. Normal bladder. Stomach/Bowel: There is a PEG tube in place. The balloon is in the stomach. There is moderate free air in the upper abdomen, more than expected 6 days out from PEG placement. A gastric leak is not excluded but there is no free fluid. Furthermore no focal inflammatory changes seen around the where the tube enters the stomach. Clinical correlation follow-up recommended. No bowel dilatation or wall thickening is seen. The appendix is normal. There is sigmoid diverticulosis, without diverticulitis. Vascular/Lymphatic: Aortic atherosclerosis. No enlarged abdominal or pelvic lymph nodes. Reproductive: The prostate appears to have been removed. No mass is seen in the prostate bed. There are no surgical clips in the pelvic sidewalls. Other: None. Musculoskeletal: There is no focally destructive lesion. The PET-CT demonstrated abnormal activity in the posterior superior facet of the left greater trochanter concerning for metastatic disease, but which does not have a CT correlate. There is degenerative change of the lumbar spine most advanced at the lowest 2 levels where there is acquired spinal stenosis. There is ankylosis across both anterior SI  joints. Review of the MIP images confirms the above findings. IMPRESSION: 1. No evidence of pulmonary arterial dilatation or embolus. 2. 7 x 5.6 cm right lower lobe mass centered in the superior segment, not larger than on 02/15/2024 but slightly increased since 02/04/2024. 3. Aspiration fluid in the trachea, right middle and lower lobe bronchi, some in the right upper lobe main bronchus, with diffuse bronchial thickening and with multilobar pneumonia described in detail above, right greater than left. 4. Increasingly prominent mediastinal and right hilar lymph nodes. 5. New small anterior pericardial effusion. 6. Aortic and coronary artery atherosclerosis. 7.  PEG tube in place with the balloon in the stomach. There is moderate free air in the upper abdomen more than expected 6 days out from PEG placement. A gastric leak is not excluded but there is no free fluid. Furthermore no focal inflammatory changes seen around the where the tube enters the stomach. Clinical correlation and follow-up recommended. 8. No evidence of metastatic disease in the abdomen or pelvis. 9. 3 mm nonobstructing left renal stone. 10. Diverticulosis without evidence of diverticulitis. 11. The PET-CT demonstrated abnormal activity in the posterior superior facet of the left greater trochanter concerning for metastatic disease, but which does not have a CT correlate. 12. Critical Value/emergent results were called by telephone at the time of interpretation on 02/29/2024 at 7:36 am to provider DR. Cleora Daft, who verbally acknowledged these results. Aortic Atherosclerosis (ICD10-I70.0). Electronically Signed   By: Denman Fischer M.D.   On: 02/29/2024 08:11   DG Chest Port 1 View Result Date: 02/29/2024 CLINICAL DATA:  Increased shortness of breath. EXAM: PORTABLE CHEST 1 VIEW COMPARISON:  02/23/2024 FINDINGS: There is a left chest wall porta catheter with tip in the projection of the SVC. Heart size and mediastinal contours are unremarkable. Right  lower lobe lung mass is again noted compatible with malignancy. No superimposed pleural effusion, edema or airspace consolidation. Interval decrease in pneumoperitoneum compared with the previous exam. Visualized osseous structures appear intact. IMPRESSION: 1. No acute cardiopulmonary abnormalities. 2. Right lower lobe lung mass compatible with malignancy. 3. Interval decrease in pneumoperitoneum compared with the previous exam. Electronically Signed   By: Kimberley Penman M.D.   On: 02/29/2024 06:23        Scheduled Meds:  Chlorhexidine  Gluconate Cloth  6 each Topical Daily   dorzolamide -timolol   1 drop Both Eyes BID   enoxaparin  (LOVENOX ) injection  40 mg Subcutaneous Q24H   insulin  aspart  0-15 Units Subcutaneous TID WC   latanoprost   1 drop Both Eyes BID   multivitamin with minerals  1 tablet Per Tube Daily   pantoprazole  (PROTONIX ) IV  40 mg Intravenous Q24H   thiamine  100 mg Per Tube Daily   Continuous Infusions:  sodium chloride  100 mL/hr at 03/01/24 1400   feeding supplement (OSMOLITE 1.5 CAL)     piperacillin -tazobactam (ZOSYN )  IV 3.375 g (03/01/24 1403)     LOS: 1 day    Tiajuana Fluke, MD [facility]   If 7PM-7AM, please contact night-coverage  03/01/2024, 2:41 PM

## 2024-03-01 NOTE — Consult Note (Signed)
 NEW PATIENT EVALUATION  Name: Mason Stewart  MRN: [id number]  Date:   02/29/2024     DOB: 09-Aug-1951   This 73 y.o. 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  REFERRING PHYSICIAN: No ref. provider found  CHIEF COMPLAINT:  Chief Complaint  Patient presents with   Shortness of Breath    DIAGNOSIS: The primary encounter diagnosis was SOB (shortness of breath). A diagnosis of Acute respiratory failure with hypoxia (HCC) was also pertinent to this visit.   PREVIOUS INVESTIGATIONS:  Chart and clinical notes reviewed MRI scan PET scan CT scans reviewed Pathology reviewed  HPI: Patient is a 73 year old male with known history of malignant melanoma diagnosed in March 2015 status post surgical resection and lymph node dissection for stage III (T2 N1 M0.  He was offered immunotherapy at that time although declined.  He recently was seen by Dr. Randy Buttery for further progression of his metastatic melanoma.  CT scan of the chest back in April showed a large well-circumscribed solid mass in the right lower lobe as well as newly enlarged right hilar and subcarinal lymph node suspicious for metastatic disease.  PET scan confirmed large hypermetabolic right lower lobe lung mass with extension to the hilum consistent with malignancy.  Also hypermetabolic sclerotic lesions involving the left hip greater trochanter worrisome for metastatic disease.  Also had focus of uptake along the central canal of the spine at L3-4.  Lumbar MRI showed diffuse dural enhancement with nodular enhancement within the conus medullaris consistent with left meningeal metastatic spread.  Also had a 7 mm enhancing nodule laterally at the L4-5 disc level.  MRI of the cervical spine as well as brain showed abnormal thickening enhancement about multiple cranial nerves at the visualized skull base concerning for leptomeningeal metastatic disease.  There was also  questionable focus of asymmetric enhancement at the posterior aspect of the left globe near the optic nerve insertion.  Patient was admitted through the ER today for aspiration pneumonia.  Patient has been recommended for immunotherapywith ipilimumab  at 1 mg/kg along with nivolumab .  Dr. Mark Sil has been contacted and has recommended possible skull base radiation therapy and possible spinal radiation.  Patient is seen today in his hospital room he is obtunded with very little communication.  Does not nod my presence.  Lungs show coarse rhonchi.  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    PAST SURGICAL HISTORY:  Past Surgical History:  Procedure Laterality Date   AXILLARY LYMPH NODE DISSECTION Right 12/13/2013   Procedure: AXILLARY LYMPH NODE DISSECTION;  Surgeon: Brandy Cal. Cornett, MD;  Location: [facility];  Service: General;  Laterality: Right;   BACK SURGERY  10/14/1995   lower back   BRONCHOSCOPY, WITH BIOPSY USING ELECTROMAGNETIC NAVIGATION Bilateral 02/16/2024   Procedure: ROBOTIC ASSISTED NAVIGATIONAL BRONCHOSCOPY;  Surgeon: Vergia Glasgow, MD;  Location: [facility] ORS;  Service: Pulmonary;  Laterality: Bilateral;   CARPAL TUNNEL RELEASE Right 03/14/2023   colonscopy  10/13/2010   CREATION, GASTROSTOMY, OPEN N/A 02/23/2024   Procedure: CREATION, GASTROSTOMY, OPEN;  Surgeon: Emmalene Hare, MD;  Location: [facility];  Service: General;  Laterality: N/A;   EXCISION MELANOMA WITH SENTINEL LYMPH NODE  BIOPSY Right 11/23/2013   Procedure: EXCISION MELANOMA WITH SENTINEL LYMPH NODE BIOPSY;  Surgeon: Brandy Cal. Cornett, MD;  Location: [facility];  Service: General;  Laterality: Right;   EYE SURGERY     cataract left, reduced pressure in left   left middle finger surgery after injury  yrs ago   PORTACATH PLACEMENT N/A 02/23/2024   Procedure: INSERTION, TUNNELED CENTRAL VENOUS DEVICE, WITH PORT;  Surgeon: Emmalene Hare, MD;  Location: [facility];  Service: General;  Laterality: N/A;   ROBOT ASSISTED LAPAROSCOPIC RADICAL PROSTATECTOMY  06/07/2012   Procedure: ROBOTIC ASSISTED LAPAROSCOPIC RADICAL PROSTATECTOMY LEVEL 2;  Surgeon: Kristeen Peto, MD;  Location: [facility];  Service: Urology;  Laterality: N/A;        FAMILY HISTORY: family history includes Arrhythmia in his brother; Birth defects in his son; Cancer in his father; Glaucoma in his father and sister; Heart disease in his mother; Hypertension in his brother and sister.  SOCIAL HISTORY:  reports that he has never smoked. He has never been exposed to tobacco smoke. He has never used smokeless tobacco. He reports current alcohol use. He reports that he does not use drugs.  ALLERGIES: Lisinopril and Pollen extract  MEDICATIONS:  Current Facility-Administered Medications  Medication Dose Route Frequency Provider Last Rate Last Admin   0.9 %  sodium chloride  infusion   Intravenous Continuous Mosetta Areola, Sudheer B, MD 100 mL/hr at 03/01/24 1005 Rate Change at 03/01/24 1005   acetaminophen  (TYLENOL ) 160 MG/5ML solution 960 mg  960 mg Per Tube Q6H PRN [place], MD       Chlorhexidine  Gluconate Cloth 2 % PADS 6 each  6 each Topical Daily Sreenath, Sudheer B, MD       dorzolamide -timolol  (COSOPT ) 2-0.5 % ophthalmic solution 1 drop  1 drop Both Eyes BID Antoniette Batty T, MD   1 drop at 03/01/24 1007   enoxaparin  (LOVENOX ) injection 40 mg  40 mg Subcutaneous Q24H Antoniette Batty T, MD   40 mg at 02/29/24 2040   haloperidol  lactate (HALDOL )  injection 2 mg  2 mg Intravenous Q6H PRN [place], MD       insulin  aspart (novoLOG ) injection 0-15 Units  0-15 Units Subcutaneous TID WC [place], MD   2 Units at 03/01/24 1136   latanoprost  (XALATAN ) 0.005 % ophthalmic solution 1 drop  1 drop Both Eyes BID Antoniette Batty T, MD   1 drop at 03/01/24 1007   ondansetron  (ZOFRAN ) tablet 8 mg  8 mg Per Tube Q8H PRN [place], MD       oxyCODONE  (Oxy IR/ROXICODONE ) immediate release tablet 5 mg  5 mg Per Tube Q4H PRN [place], MD       pantoprazole  (PROTONIX ) injection 40 mg  40 mg Intravenous Q24H Antoniette Batty T, MD   40 mg at 03/01/24 1007   piperacillin -tazobactam (ZOSYN ) IVPB 3.375 g  3.375 g Intravenous Q8H Alice Innocent, RPH   Stopped at 03/01/24 1010   sodium chloride  flush (NS) 0.9 % injection 10-40 mL  10-40 mL Intracatheter PRN Tiajuana Fluke, MD        ECOG PERFORMANCE STATUS:  4 - Bedbound  REVIEW OF SYSTEMS: Patient has history of dysphagia neuropathy diabetes mellitus hypertension history of melanoma   PHYSICAL EXAM: BP (!) 172/90 (BP Location: Left Arm)   Pulse 92   Temp 98.9 F (37.2 C)  Resp 18   Ht 5\' 6"  (1.676 m)   Wt 170 lb 8.4 oz (77.3 kg)   SpO2 98%   BMI 27.52 kg/m  Patient is obtunded.  Lungs show coarse rhonchi.  Well-developed well-nourished patient in NAD. HEENT reveals PERLA, EOMI, discs not visualized.  Oral cavity is clear. No oral mucosal lesions are identified. Neck is clear without evidence of cervical or supraclavicular adenopathy.  Cardiac examination is essentially unremarkable with regular rate and rhythm without murmur rub or thrill. Abdomen is benign with no organomegaly or masses noted.  LABORATORY DATA: Labs reviewed    RADIOLOGY RESULTS: CT scans MRI scans PET scan all reviewed compatible with above-stated findings   IMPRESSION: Widespread metastatic disease from malignant melanoma with cranial nerve and base of skull involvement and 73 year old male  PLAN: At this time  would like the patient's overall condition to improve prior to initiating possible radiation therapy to his skull base.  I have reached out to his family to discuss the situation with them.  I have tentatively set him up for simulation next week.  Will plan on delivering 30 Gray in 10 fractions to his skull base trying to target areas of leptomeningeal involvement as well as cranial nerve involvement seen on his MRI scan.  Will discuss this further with the patient.  Certainly hospice care would be a viable alternative for this patient and must be considered.  I would like to take this opportunity to thank you for allowing me to participate in the care of your patient.Glenis Langdon, MD

## 2024-03-01 NOTE — ED Notes (Addendum)
Patient suctioned.

## 2024-03-01 NOTE — Evaluation (Addendum)
 Physical Therapy Evaluation Patient Details Name: Mason Stewart MRN: [id number] DOB: Jun 19, 1951 Today's Date: 03/01/2024  History of Present Illness  Pt is a 73 y/o M admitted on 02/29/24 after presenting with c/o increasing cough, SOB, hypoxia & poor feeding. Pt is being treated for acute hypoxic respiratory failure, RML & RLL aspiration PNA, acute metabolic encephalopathy. 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 Per family, pt has had a decline over the past couple weeks to months. Pt has recently been ambulating short distances with RW, wife assisting as able, but pt has had 3-5 falls in the past month. On this date, pt is very lethargic but does open eyes on command, responds to his name & follows simple commands with extra time throughout session. Pt requires max assist for supine>sit, mod assist sit>supine but is able to transfer sit<>stand with min<>mod assist & take a few side steps along EOB. Pt did fairly well with mobility, anticipate he will progress as his cognition clears & he is more alert. Will recommend post acute rehab <3 hours therapy upon d/c at this time with hopes pt will progress to HHPT.        If plan is discharge home, recommend the following: A lot of help with walking and/or transfers;A lot of help with bathing/dressing/bathroom;Assistance with cooking/housework;Help with stairs or ramp for entrance;Direct supervision/assist for financial management;Supervision due to cognitive status;Assist for transportation   Can travel by private vehicle   No    Equipment Recommendations Wheelchair cushion (measurements PT);Wheelchair (measurements PT);BSC/3in1;Hospital bed  Recommendations for Other Services    OT consult    Functional Status Assessment Patient has had a recent decline in their functional status and demonstrates the ability to make significant improvements in function in a reasonable and predictable amount of time.     Precautions / Restrictions Precautions Precautions: Fall Restrictions Weight Bearing Restrictions Per Provider Order: No      Mobility  Bed Mobility Overal bed mobility: Needs Assistance Bed Mobility: Supine to Sit, Sit to Supine     Supine to sit: Max assist, HOB elevated Sit to supine: Mod assist        Transfers Overall transfer level: Needs assistance Equipment used: 1 person hand held assist Transfers: Sit to/from Stand Sit to Stand: Mod assist, Min assist           General transfer comment: sit>stand from ED stretcher with RUE HHA, min<>mod assist    Ambulation/Gait Ambulation/Gait assistance: Mod assist, Min assist Gait Distance (Feet): 3 Feet Assistive device: 1 person hand held assist Gait Pattern/deviations: Decreased step length - right, Decreased step length - left, Decreased stride length, Trunk flexed Gait velocity: decreased     General Gait Details: side steps to R along EOB with HHA, min<>Mod assist  Stairs            Wheelchair Mobility     Tilt Bed    Modified Rankin (Stroke Patients Only)       Balance Overall balance assessment: Needs assistance Sitting-balance support: Feet supported Sitting balance-Leahy Scale: Fair Sitting balance - Comments: CGA static sitting   Standing balance support: During functional activity, Single extremity supported Standing balance-Leahy Scale: Fair  Pertinent Vitals/Pain Pain Assessment Pain Assessment: Faces Faces Pain Scale: No hurt    Home Living Family/patient expects to be discharged to:: Private residence Living Arrangements: Spouse/significant other Available Help at Discharge: Family;Available 24 hours/day Type of Home:  House Home Access: Ramped entrance;Stairs to enter Entrance Stairs-Rails: Right;Left;Can reach both Entrance Stairs-Number of Steps: 1 step threshold into home, 2 steps into kitchen with B rails   Home Layout: Two level;Able to live on main level with bedroom/bathroom Home Equipment: Rolling Walker (2 wheels) (has a w/c that is >65 years old & belonged to someone else, can use if necessary)      Prior Function               Mobility Comments: Ambulatory without AD ~2 weeks ago (some light assistance), now using RW in the home, ~3-5 falls in the past month, ADLs Comments: Able to bathe & dress himself ~2 weeks ago, now needing help.     Extremity/Trunk Assessment   Upper Extremity Assessment Upper Extremity Assessment: Generalized weakness    Lower Extremity Assessment Lower Extremity Assessment: Generalized weakness    Cervical / Trunk Assessment Cervical / Trunk Assessment:  (rounded shoulders)  Communication   Communication Communication: Impaired Factors Affecting Communication: Reduced clarity of speech    Cognition Arousal: Lethargic Behavior During Therapy: Flat affect, Restless   PT - Cognitive impairments: Difficult to assess                       PT - Cognition Comments: Pt restless at times, pulling at catheter, gown, easily redirected. Following commands: Impaired Following commands impaired: Follows one step commands with increased time, Follows one step commands inconsistently     Cueing Cueing Techniques: Verbal cues     General Comments General comments (skin integrity, edema, etc.): Pt with incontinent void, condom catheter off, brief saturated. Provided assistance with doffing soiled brief.    Exercises     Assessment/Plan    PT Assessment Patient needs continued PT services  PT Problem List Decreased strength;Decreased cognition;Decreased activity tolerance;Decreased balance;Decreased mobility;Decreased knowledge of use of  DME;Decreased safety awareness;Decreased range of motion       PT Treatment Interventions DME instruction;Balance training;Gait training;Neuromuscular re-education;Stair training;Therapeutic activities;Functional mobility training;Therapeutic exercise;Patient/family education    PT Goals (Current goals can be found in the Care Plan section)  Acute Rehab PT Goals Patient Stated Goal: get better PT Goal Formulation: With family Time For Goal Achievement: 03/15/24 Potential to Achieve Goals: Fair    Frequency Min 3X/week     Co-evaluation               AM-PAC PT "6 Clicks" Mobility  Outcome Measure Help needed turning from your back to your side while in a flat bed without using bedrails?: A Lot Help needed moving from lying on your back to sitting on the side of a flat bed without using bedrails?: A Lot Help needed moving to and from a bed to a chair (including a wheelchair)?: A Lot Help needed standing up from a chair using your arms (e.g., wheelchair or bedside chair)?: A Lot Help needed to walk in hospital room?: A Lot Help needed climbing 3-5 steps with a railing? : Total 6 Click Score: 11    End of Session   Activity Tolerance: Patient tolerated treatment well Patient left: in bed;with call bell/phone within reach;with family/visitor present   PT Visit Diagnosis: Muscle weakness (generalized) (M62.81);Unsteadiness on feet (R26.81);Other abnormalities of gait and mobility (  R26.89);Difficulty in walking, not elsewhere classified (R26.2)    Time: 1610-9604 PT Time Calculation (min) (ACUTE ONLY): 30 min   Charges:   PT Evaluation $PT Eval Moderate Complexity: 1 Mod   PT General Charges $$ ACUTE PT VISIT: 1 Visit         Emaline Handsome, PT, DPT 03/01/24, 1:23 PM   Venetta Gill 03/01/2024, 1:17 PM

## 2024-03-01 NOTE — Progress Notes (Signed)
 03/01/2024  Subjective: Patient appears more comfortable this afternoon.  Still able to hold a conversation.  No drooling or spitting noted during my visit, but he has required suctioning.  WBC improved today, down to 18.3.  Denies any abdominal pain.  Vital signs: Temp:  [98 F (36.7 C)-98.9 F (37.2 C)] 98.9 F (37.2 C) (05/20 1332) Pulse Rate:  [92-108] 92 (05/20 1332) Resp:  [9-20] 18 (05/20 1332) BP: (138-192)/(78-117) 172/90 (05/20 1332) SpO2:  [92 %-100 %] 98 % (05/20 1332)   Intake/Output: 05/19 0701 - 05/20 0700 In: 2121.9 [I.V.:2035.7; IV Piggyback:86.2] Out: -     Physical Exam: Constitutional: No acute distress Abdomen:  soft, non-distended, non-tender.  G tube in place.  Silicone bumper had slid back, cinched back to 5 mark.  Labs:  Recent Labs    02/29/24 0557 03/01/24 0432  WBC 34.3* 18.3*  HGB 13.9 11.6*  HCT 38.2* 32.0*  PLT 372 252   Recent Labs    02/29/24 0557 03/01/24 0432  NA 134* 135  K 3.2* 2.8*  CL 95* 102  CO2 27 23  GLUCOSE 195* 143*  BUN 17 12  CREATININE 0.69 0.54*  CALCIUM  9.2 7.8*   No results for input(s): "LABPROT", "INR" in the last 72 hours.  Imaging: No results found.  Assessment/Plan: This is a 73 y.o. male s/p G tube and port-a-cath placement.  --Abdomen is soft, benign, non-distended.  Patient's family reports no BM while in hospital, and unclear if any flatus.  However, given benign exam, I think it'd be ok to try starting trickle feeds through G tube.   --Will place RD consult.  Would only start trickle feeds and check for residuals to make sure he's tolerating.   I spent 35 minutes dedicated to the care of this patient on the date of this encounter to include pre-visit review of records, face-to-face time with the patient discussing diagnosis and management, and any post-visit coordination of care.  Marene Shape, MD [facility]

## 2024-03-01 NOTE — Telephone Encounter (Signed)
 Received message from pt's daughter that they missed Dr. Whitney Hams visit today for his inpatient consult. Pt's daughter and family have questions regarding radiation and would like to discuss with Dr. Jacalyn Martin.   Please call his daughter, Radene Buffalo, at [phone].

## 2024-03-01 NOTE — ED Notes (Signed)
 Patient found by this RN with IV removed and blood on gown. Patient arm cleaned. Pt placed in clean gown and provided with fresh warm blanket. Wife at bedside. Patient resting comfortably. No other needs at this time.

## 2024-03-01 NOTE — Progress Notes (Signed)
 Full note to follow. Discussed with patient and family. Patient is barely responsive but clearly wants to be DNR. Family is in agreement with stopping active treatment and proceeding with comfort care. Recommend keeping him hospitalized for 24- 48 hours. If he is atble for discharge after that home with hospice versus hospice home are options. Prognosis likely hours- days  Dr. Seretha Dance, MD, MPH [facility] [phone] 03/01/2024 4:32 PM

## 2024-03-01 NOTE — Consult Note (Signed)
 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 Mosetta Areola  Date of visit: 03/01/24    History of presenting illness-patient is a 73 year old male with a past medical history significant for malignant melanoma.  Most recently he was diagnosed with metastatic disease to his lungs as well as leptomeningeal carcinomatosis.  He received cycle 1 of ipilimumab  and nivolumab  2 weeks ago.  Given cranial nerve involvement from leptomeningeal carcinomatosis patient has had problems with swallowing and therefore had PEG tube placed for nutrition.  Patient developed acute encephalopathy and peg tube feed aspiration and was therefore admitted to the hospital with aspiration pneumonia.  Clinically patient's condition has declined rapidly over the last 2 weeks.   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   Diabetes mellitus treated  with oral medication (HCC) 06/11/2023   Preventative health care 05/26/2023   Radial neuropathy, 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   Diabetes mellitus with circulatory complication, without long-term current use of insulin  (HCC) 05/27/2021   Hypertension 05/03/2020   Glaucoma 05/03/2020   GERD (gastroesophageal reflux disease) 05/03/2020   S/P prostatectomy 05/03/2020   Erectile dysfunction 05/03/2020   History of melanoma 03/27/2014   History of prostate cancer 12/12/2013     Past Medical History:  Diagnosis Date   Cancer [facility])    prostate cancer   Diabetes mellitus without complication (HCC)    GERD (gastroesophageal reflux disease)    Glaucoma (increased eye pressure)    Headache    History of prostate cancer 12/12/2013   Hypertension    Melanoma (HCC)    Melanoma of thoracic region (HCC) 12/12/2013   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 Cornett, MD;  Location: [facility];  Service: General;  Laterality: Right;   BACK SURGERY  10/14/1995   lower back   BRONCHOSCOPY, WITH BIOPSY USING ELECTROMAGNETIC NAVIGATION Bilateral 02/16/2024   Procedure: ROBOTIC ASSISTED NAVIGATIONAL BRONCHOSCOPY;  Surgeon: Vergia Glasgow, MD;  Location: [facility];  Service: Pulmonary;  Laterality: Bilateral;   CARPAL TUNNEL RELEASE Right 03/14/2023   colonscopy  10/13/2010   CREATION, GASTROSTOMY, OPEN N/A 02/23/2024   Procedure: CREATION, GASTROSTOMY, OPEN;  Surgeon: Emmalene Hare,  MD;  Location: [facility];  Service: General;  Laterality: N/A;   EXCISION MELANOMA WITH SENTINEL LYMPH NODE BIOPSY Right 11/23/2013   Procedure: EXCISION MELANOMA WITH SENTINEL LYMPH NODE BIOPSY;   Surgeon: Brandy Cal. Cornett, MD;  Location: [facility];  Service: General;  Laterality: Right;   EYE SURGERY     cataract left, reduced pressure in left   left middle finger surgery after injury  yrs ago   PORTACATH PLACEMENT N/A 02/23/2024   Procedure: INSERTION, TUNNELED CENTRAL VENOUS DEVICE, WITH PORT;  Surgeon: Emmalene Hare, MD;  Location: [facility];  Service: General;  Laterality: N/A;   ROBOT ASSISTED LAPAROSCOPIC RADICAL PROSTATECTOMY  06/07/2012   Procedure: ROBOTIC ASSISTED LAPAROSCOPIC RADICAL PROSTATECTOMY LEVEL 2;  Surgeon: Kristeen Peto, MD;  Location: [facility];  Service: Urology;  Laterality: N/A;        Social History   Socioeconomic History   Marital status: Married    Spouse name: Wallene Gum   Number of children: 3   Years of education: some college   Highest education level: Not on file  Occupational History   Occupation: Web designer  Tobacco Use   Smoking status: Never    Passive exposure: Never   Smokeless tobacco: Never  Vaping Use   Vaping status: Never Used  Substance and Sexual Activity   Alcohol use: Yes    Comment: 1-2 times a week, 1 drink   Drug use: No   Sexual activity: Not Currently  Other Topics Concern   Not on file  Social History Narrative   05/03/20   From: [place]   Living: with wife Wallene Gum [phone]) and adult daughter   Work: roofing business       Family: Benedetta Bradley and Megan Spindle (also has one son who has passed away) - 2 grandchildren      Enjoys: work on cars, travel, vacation      Exercise: climbing Paediatric nurse and working   Diet: country cooking - eat a lot of veggies       No living will    Wife should be decision maker--alternate is daughters   Would accept resuscitation   Would accept tube feeds--temporary   Social Drivers of Corporate investment banker Strain: Not on file  Food Insecurity: No Food Insecurity (02/29/2024)   Hunger Vital Sign    Worried About Running Out of Food in the Last Year:  Never true    Ran Out of Food in the Last Year: Never true  Transportation Needs: Patient Declined (03/01/2024)   PRAPARE - Administrator, Civil Service (Medical): Patient declined    Lack of Transportation (Non-Medical): Patient declined  Physical Activity: Not on file  Stress: Not on file  Social Connections: Moderately Integrated (02/29/2024)   Social Connection and Isolation Panel [NHANES]    Frequency of Communication with Friends and Family: Three times a week    Frequency of Social Gatherings with Friends and Family: Twice a week    Attends Religious Services: 1 to 4 times per year    Active Member of [employer] or Organizations: No    Attends Banker Meetings: Never    Marital Status: Married  Catering manager Violence: Not At Risk (02/29/2024)   Humiliation, Afraid, Rape, and Kick questionnaire    Fear of Current or Ex-Partner: No    Emotionally Abused: No    Physically Abused: No    Sexually Abused: No     Family History  Problem Relation Age  of Onset   Heart disease Mother    Cancer Father        mesthelioma   Glaucoma Father    Hypertension Sister    Hypertension Brother    Arrhythmia Brother    Birth defects Son        Batton's disease   Glaucoma Sister      Current Facility-Administered Medications:    0.9 %  sodium chloride  infusion, , Intravenous, Continuous, Sreenath, Sudheer B, MD, Last Rate: 100 mL/hr at 03/01/24 1400, New Bag at 03/01/24 1400   acetaminophen  (TYLENOL ) 160 MG/5ML solution 960 mg, 960 mg, Per Tube, Q6H PRN, [place], MD   Chlorhexidine  Gluconate Cloth 2 % PADS 6 each, 6 each, Topical, Daily, Sreenath, Sudheer B, MD   dorzolamide -timolol  (COSOPT ) 2-0.5 % ophthalmic solution 1 drop, 1 drop, Both Eyes, BID, Antoniette Batty T, MD, 1 drop at 03/01/24 1007   enoxaparin  (LOVENOX ) injection 40 mg, 40 mg, Subcutaneous, Q24H, Zhang, Ping T, MD, 40 mg at 02/29/24 2040   feeding supplement (OSMOLITE 1.5 CAL) liquid 1,000 mL, 1,000  mL, Per Tube, Continuous, Sreenath, Sudheer B, MD   haloperidol  lactate (HALDOL ) injection 2 mg, 2 mg, Intravenous, Q6H PRN, Zhang, Ping T, MD   insulin  aspart (novoLOG ) injection 0-15 Units, 0-15 Units, Subcutaneous, TID WC, [place], MD, 2 Units at 03/01/24 1136   latanoprost  (XALATAN ) 0.005 % ophthalmic solution 1 drop, 1 drop, Both Eyes, BID, Antoniette Batty T, MD, 1 drop at 03/01/24 1007   multivitamin with minerals tablet 1 tablet, 1 tablet, Per Tube, Daily, Sreenath, Sudheer B, MD   ondansetron  (ZOFRAN ) tablet 8 mg, 8 mg, Per Tube, Q8H PRN, [place], MD   oxyCODONE  (Oxy IR/ROXICODONE ) immediate release tablet 5 mg, 5 mg, Per Tube, Q4H PRN, [place], MD   pantoprazole  (PROTONIX ) injection 40 mg, 40 mg, Intravenous, Q24H, Antoniette Batty T, MD, 40 mg at 03/01/24 1007   piperacillin -tazobactam (ZOSYN ) IVPB 3.375 g, 3.375 g, Intravenous, Q8H, Hunt, Madison H, RPH, Last Rate: 12.5 mL/hr at 03/01/24 1403, 3.375 g at 03/01/24 1403   sodium chloride  flush (NS) 0.9 % injection 10-40 mL, 10-40 mL, Intracatheter, PRN, Mosetta Areola, Sudheer B, MD   thiamine (VITAMIN B1) tablet 100 mg, 100 mg, Per Tube, Daily, Margery Sheets B, MD   Physical exam:  Vitals:   03/01/24 0605 03/01/24 1000 03/01/24 1312 03/01/24 1332  BP: (!) 156/83 (!) 151/78  (!) 172/90  Pulse: 96  97 92  Resp: 20 13 17 18   Temp: 98.3 F (36.8 C)  98 F (36.7 C) 98.9 F (37.2 C)  TempSrc: Oral  Axillary   SpO2: 100%  96% 98%  Weight:      Height:       Physical Exam Constitutional:      Comments: Patient is drowsy.  Eyes are open but he is mostly nonverbal.  Only nods his head to verbal commands.  Cardiovascular:     Rate and Rhythm: Regular rhythm. Tachycardia present.     Heart sounds: Normal heart sounds.  Pulmonary:     Comments: Breathing appears labored Skin:    General: Skin is warm and dry.           Latest Ref Rng & Units 03/01/2024    4:32 AM  CMP  Glucose 70 - 99 mg/dL 952   BUN 8 - 23 mg/dL 12    Creatinine 8.41 - 1.24 mg/dL 3.24   Sodium 401 - 027 mmol/L 135  Potassium 3.5 - 5.1 mmol/L 2.8   Chloride 98 - 111 mmol/L 102   CO2 22 - 32 mmol/L 23   Calcium  8.9 - 10.3 mg/dL 7.8       Latest Ref Rng & Units 03/01/2024    4:32 AM  CBC  WBC 4.0 - 10.5 K/uL 18.3   Hemoglobin 13.0 - 17.0 g/dL 13.0   Hematocrit 86.5 - 52.0 % 32.0   Platelets 150 - 400 K/uL 252     @IMAGES @  CT SUPER D CHEST WO CONTRAST Result Date: 02/29/2024 CLINICAL DATA:  Right lower lobe lung mass.  Pre bronchoscopy. EXAM: CT CHEST WITHOUT CONTRAST TECHNIQUE: Multidetector CT imaging of the chest was performed using thin slice collimation for electromagnetic bronchoscopy planning purposes, without intravenous contrast. RADIATION DOSE REDUCTION: This exam was performed according to the departmental dose-optimization program which includes automated exposure control, adjustment of the mA and/or kV according to patient size and/or use of iterative reconstruction technique. COMPARISON:  PET 02/04/2024 and CT chest 02/01/2024. FINDINGS: Cardiovascular: Atherosclerotic calcification of the aorta, aortic valve and coronary arteries. Heart is at the upper limits of normal in size. No pericardial effusion. Mediastinum/Nodes: No pathologically enlarged mediastinal or axillary lymph nodes. Hilar regions are difficult to definitively evaluate without IV contrast. Subcarinal lymph node measures 11 mm, previously 14 mm. Surgical clips in the right axilla. Air in the esophagus can be seen with dysmotility. Lungs/Pleura: Calcified granulomas. Right lower lobe mass measures 5.8 x 6.9 cm, stable. Patchy peribronchovascular ground-glass and nodularity in the surrounding right middle and right lower lobes. Additional peribronchovascular ground-glass in the lingula and left lower lobe. No pleural fluid. Airway is unremarkable. Upper Abdomen: Liver margin is slightly irregular. Visualized portions of the liver, gallbladder, adrenal glands,  kidneys, spleen, pancreas, stomach and bowel are otherwise grossly unremarkable. No upper abdominal adenopathy. Musculoskeletal: Degenerative changes in the spine. No worrisome lytic or sclerotic lesions. IMPRESSION: 1. Stable right lower lobe mass, consistent with biopsy-proven metastatic melanoma. 2. Patchy peribronchovascular ground-glass and nodularity in the lung bases, right greater than left, of indeterminate etiology but possibly treatment related. 3. Liver appears mildly cirrhotic. 4. Aortic atherosclerosis (ICD10-I70.0). Coronary artery calcification. Electronically Signed   By: Shearon Denis M.D.   On: 02/29/2024 14:23   CT Angio Chest PE W and/or Wo Contrast Result Date: 02/29/2024 CLINICAL DATA:  Known right lower lobe mass/neoplasm, PET positive, presenting with increased weakness and hypoxia with increased work of breathing. The patient had an open gastrostomy creation 02/23/2024. EXAM: CT ANGIOGRAPHY CHEST CT ABDOMEN AND PELVIS WITH CONTRAST TECHNIQUE: Multidetector CT imaging of the chest was performed using the standard protocol during bolus administration of intravenous contrast. Multiplanar CT image reconstructions and MIPs were obtained to evaluate the vascular anatomy. Multidetector CT imaging of the abdomen and pelvis was performed using the standard protocol during bolus administration of intravenous contrast. RADIATION DOSE REDUCTION: This exam was performed according to the departmental dose-optimization program which includes automated exposure control, adjustment of the mA and/or kV according to patient size and/or use of iterative reconstruction technique. CONTRAST:  OMNIPAQUE  IOHEXOL  350 MG/ML SOLN COMPARISON:  Chest CT without contrast 02/15/2024, PET-CT 02/04/2024, and a portable chest from 02/23/2024 and today. FINDINGS: CTA CHEST FINDINGS Cardiovascular: There is a new small anterior pericardial effusion. The cardiac size is normal. There are left main and 2 vessel  coronary calcifications in the LAD and right coronary artery, heaviest in the LAD. There is a new left chest implanted port, with subclavian approach catheter  terminating at about the brachiocephalic/SVC junction. There is atherosclerosis in the aorta and great vessels without aneurysm, dissection or stenosis. No pulmonary arterial dilatation or embolism is seen. Pulmonary veins are normal caliber. There is bronchovascular mass encasement and narrowing along the lower hilar area in the lower lobe. Mediastinum/Nodes: There are increasingly prominent lymph nodes in the azygoesophageal recess, largest 1.5 x 2.5 cm on 2:90. There are increasingly prominent right lower hilar lymph nodes difficult to separate out from the right lower lobe mass. Largest of these is estimated 1.9 cm short axis on 2:91. There is an enlarging right mid hilar node of 1.5 cm on 2: 76, additional subcarinal lymph nodes up to 1 cm. I do not see further adenopathy. Lungs/Pleura: There is moderate fluid consistent with aspiration in the trachea, tracking down into right middle and lower lobe bronchi. Fluid is also seen in the right upper lobe main bronchus and there is diffuse bronchial thickening. There are multiple subsegmental and a few segmental fluid opacified bronchi in the right lower lobe basal segments with the right lower lobe distal main bronchus narrowed by the tumor. There is scattered subsegmental bronchial impactions in the right middle lobe. Right lower lobe mass abutting the hilum is centered in the superior segment and measures 7 x 5.6 cm on 2:10, not larger than on 02/15/2024 but on 02/04/2024 measured 6.5 x 5.5 cm. There is patchy ground-glass airspace disease in the left upper lobe, patchy denser ground-glass intermixed with peribronchovascular nodular airspace disease in the right upper lobe, ill-defined scattered hazy airspace disease in left lower lobe. In the right lower lobe, in addition to a fluid impactions in multiple  subsegmental and downstream bronchi, there is patchy bronchopneumonia greatest in the superior segment, with patchy tree-in-bud and ground-glass airspace disease in the right middle lobe. Findings consistent with multilobar pneumonia. No fluid is seen in the left-sided bronchi. There are no pleural effusions. Musculoskeletal: Degenerative change thoracic spine. No regional bone metastasis. At T8-9 and T9-10 there small left paracentral calcified disc extrusions without overt mass effect. Review of the MIP images confirms the above findings. CT ABDOMEN and PELVIS FINDINGS Hepatobiliary: No liver metastasis is seen. Unremarkable gallbladder and bile ducts. Pancreas: No abnormality. Spleen: No abnormality. Adrenals/Urinary Tract: No adrenal renal mass enhancement. There is a 2 cm right lower pole parapelvic Bosniak 2 cyst with a Hounsfield density of 27, not requiring follow-up imaging. There is a parapelvic cyst in the lower left kidney which is too small to characterize consistent with another Bosniak 2 cyst. There is a 3 mm caliceal stone in the midpole of the left kidney. No other urolithiasis is seen and no obstructing stones. Normal bladder. Stomach/Bowel: There is a PEG tube in place. The balloon is in the stomach. There is moderate free air in the upper abdomen, more than expected 6 days out from PEG placement. A gastric leak is not excluded but there is no free fluid. Furthermore no focal inflammatory changes seen around the where the tube enters the stomach. Clinical correlation follow-up recommended. No bowel dilatation or wall thickening is seen. The appendix is normal. There is sigmoid diverticulosis, without diverticulitis. Vascular/Lymphatic: Aortic atherosclerosis. No enlarged abdominal or pelvic lymph nodes. Reproductive: The prostate appears to have been removed. No mass is seen in the prostate bed. There are no surgical clips in the pelvic sidewalls. Other: None. Musculoskeletal: There is no focally  destructive lesion. The PET-CT demonstrated abnormal activity in the posterior superior facet of the left greater trochanter concerning  for metastatic disease, but which does not have a CT correlate. There is degenerative change of the lumbar spine most advanced at the lowest 2 levels where there is acquired spinal stenosis. 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 Review of the MIP images confirms the above findings. IMPRESSION: 1. No evidence of pulmonary arterial dilatation or embolus. 2. 7 x 5.6 cm right lower lobe mass centered in the superior segment, not larger than on 02/15/2024 but slightly increased since 02/04/2024. 3. Aspiration fluid in the trachea, right middle and lower lobe bronchi, some in the right upper lobe main bronchus, with diffuse bronchial thickening and with multilobar pneumonia described in detail above, right greater than left. 4. Increasingly prominent mediastinal and right hilar lymph nodes. 5. New small anterior pericardial effusion. 6. Aortic and coronary artery atherosclerosis. 7. PEG tube in place with the balloon in the stomach. There is moderate free air in the upper abdomen more than expected 6 days out from PEG placement. A gastric leak is not excluded but there is no free fluid. Furthermore no focal inflammatory changes seen around the where the tube enters the stomach. Clinical correlation and follow-up recommended. 8. No evidence of metastatic disease in the abdomen or pelvis. 9. 3 mm nonobstructing left renal stone. 10. Diverticulosis without evidence of diverticulitis. 11. The PET-CT demonstrated abnormal activity in the posterior superior facet of the left greater trochanter concerning for metastatic disease, but which does not have a CT correlate. 12. Critical Value/emergent results were called by telephone at the time of interpretation on 02/29/2024 at 7:36 am to provider DR. Cleora Daft, who verbally acknowledged these results. Aortic Atherosclerosis (ICD10-I70.0).  Electronically Signed   By: Denman Fischer M.D.   On: 02/29/2024 08:11   CT ABDOMEN PELVIS W CONTRAST Result Date: 02/29/2024 CLINICAL DATA:  Known right lower lobe mass/neoplasm, PET positive, presenting with increased weakness and hypoxia with increased work of breathing. The patient had an open gastrostomy creation 02/23/2024. EXAM: CT ANGIOGRAPHY CHEST CT ABDOMEN AND PELVIS WITH CONTRAST TECHNIQUE: Multidetector CT imaging of the chest was performed using the standard protocol during bolus administration of intravenous contrast. Multiplanar CT image reconstructions and MIPs were obtained to evaluate the vascular anatomy. Multidetector CT imaging of the abdomen and pelvis was performed using the standard protocol during bolus administration of intravenous contrast. RADIATION DOSE REDUCTION: This exam was performed according to the departmental dose-optimization program which includes automated exposure control, adjustment of the mA and/or kV according to patient size and/or use of iterative reconstruction technique. CONTRAST:  OMNIPAQUE  IOHEXOL  350 MG/ML SOLN COMPARISON:  Chest CT without contrast 02/15/2024, PET-CT 02/04/2024, and a portable chest from 02/23/2024 and today. FINDINGS: CTA CHEST FINDINGS Cardiovascular: There is a new small anterior pericardial effusion. The cardiac size is normal. There are left main and 2 vessel coronary calcifications in the LAD and right coronary artery, heaviest in the LAD. There is a new left chest implanted port, with subclavian approach catheter terminating at about the brachiocephalic/SVC junction. There is atherosclerosis in the aorta and great vessels without aneurysm, dissection or stenosis. No pulmonary arterial dilatation or embolism is seen. Pulmonary veins are normal caliber. There is bronchovascular mass encasement and narrowing along the lower hilar area in the lower lobe. Mediastinum/Nodes: There are increasingly prominent lymph nodes in the  azygoesophageal recess, largest 1.5 x 2.5 cm on 2:90. There are increasingly prominent right lower hilar lymph nodes difficult to separate out from the right lower lobe mass. Largest of these is estimated 1.9  cm short axis on 2:91. There is an enlarging right mid hilar node of 1.5 cm on 2: 76, additional subcarinal lymph nodes up to 1 cm. I do not see further adenopathy. Lungs/Pleura: There is moderate fluid consistent with aspiration in the trachea, tracking down into right middle and lower lobe bronchi. Fluid is also seen in the right upper lobe main bronchus and there is diffuse bronchial thickening. There are multiple subsegmental and a few segmental fluid opacified bronchi in the right lower lobe basal segments with the right lower lobe distal main bronchus narrowed by the tumor. There is scattered subsegmental bronchial impactions in the right middle lobe. Right lower lobe mass abutting the hilum is centered in the superior segment and measures 7 x 5.6 cm on 2:10, not larger than on 02/15/2024 but on 02/04/2024 measured 6.5 x 5.5 cm. There is patchy ground-glass airspace disease in the left upper lobe, patchy denser ground-glass intermixed with peribronchovascular nodular airspace disease in the right upper lobe, ill-defined scattered hazy airspace disease in left lower lobe. In the right lower lobe, in addition to a fluid impactions in multiple subsegmental and downstream bronchi, there is patchy bronchopneumonia greatest in the superior segment, with patchy tree-in-bud and ground-glass airspace disease in the right middle lobe. Findings consistent with multilobar pneumonia. No fluid is seen in the left-sided bronchi. There are no pleural effusions. Musculoskeletal: Degenerative change thoracic spine. No regional bone metastasis. At T8-9 and T9-10 there small left paracentral calcified disc extrusions without overt mass effect. Review of the MIP images confirms the above findings. CT ABDOMEN and PELVIS  FINDINGS Hepatobiliary: No liver metastasis is seen. Unremarkable gallbladder and bile ducts. Pancreas: No abnormality. Spleen: No abnormality. Adrenals/Urinary Tract: No adrenal renal mass enhancement. There is a 2 cm right lower pole parapelvic Bosniak 2 cyst with a Hounsfield density of 27, not requiring follow-up imaging. There is a parapelvic cyst in the lower left kidney which is too small to characterize consistent with another Bosniak 2 cyst. There is a 3 mm caliceal stone in the midpole of the left kidney. No other urolithiasis is seen and no obstructing stones. Normal bladder. Stomach/Bowel: There is a PEG tube in place. The balloon is in the stomach. There is moderate free air in the upper abdomen, more than expected 6 days out from PEG placement. A gastric leak is not excluded but there is no free fluid. Furthermore no focal inflammatory changes seen around the where the tube enters the stomach. Clinical correlation follow-up recommended. No bowel dilatation or wall thickening is seen. The appendix is normal. There is sigmoid diverticulosis, without diverticulitis. Vascular/Lymphatic: Aortic atherosclerosis. No enlarged abdominal or pelvic lymph nodes. Reproductive: The prostate appears to have been removed. No mass is seen in the prostate bed. There are no surgical clips in the pelvic sidewalls. Other: None. Musculoskeletal: There is no focally destructive lesion. The PET-CT demonstrated abnormal activity in the posterior superior facet of the left greater trochanter concerning for metastatic disease, but which does not have a CT correlate. There is degenerative change of the lumbar spine most advanced at the lowest 2 levels where there is acquired spinal stenosis. 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 Review of the MIP images confirms the above findings. IMPRESSION: 1. No evidence of pulmonary arterial dilatation or embolus. 2. 7 x 5.6 cm right lower lobe mass centered in the superior  segment, not larger than on 02/15/2024 but slightly increased since 02/04/2024. 3. Aspiration fluid in the trachea, right middle and  lower lobe bronchi, some in the right upper lobe main bronchus, with diffuse bronchial thickening and with multilobar pneumonia described in detail above, right greater than left. 4. Increasingly prominent mediastinal and right hilar lymph nodes. 5. New small anterior pericardial effusion. 6. Aortic and coronary artery atherosclerosis. 7. PEG tube in place with the balloon in the stomach. There is moderate free air in the upper abdomen more than expected 6 days out from PEG placement. A gastric leak is not excluded but there is no free fluid. Furthermore no focal inflammatory changes seen around the where the tube enters the stomach. Clinical correlation and follow-up recommended. 8. No evidence of metastatic disease in the abdomen or pelvis. 9. 3 mm nonobstructing left renal stone. 10. Diverticulosis without evidence of diverticulitis. 11. The PET-CT demonstrated abnormal activity in the posterior superior facet of the left greater trochanter concerning for metastatic disease, but which does not have a CT correlate. 12. Critical Value/emergent results were called by telephone at the time of interpretation on 02/29/2024 at 7:36 am to provider DR. Cleora Daft, who verbally acknowledged these results. Aortic Atherosclerosis (ICD10-I70.0). Electronically Signed   By: Denman Fischer M.D.   On: 02/29/2024 08:11   DG Chest Port 1 View Result Date: 02/29/2024 CLINICAL DATA:  Increased shortness of breath. EXAM: PORTABLE CHEST 1 VIEW COMPARISON:  02/23/2024 FINDINGS: There is a left chest wall porta catheter with tip in the projection of the SVC. Heart size and mediastinal contours are unremarkable. Right lower lobe lung mass is again noted compatible with malignancy. No superimposed pleural effusion, edema or airspace consolidation. Interval decrease in pneumoperitoneum compared with the  previous exam. Visualized osseous structures appear intact. IMPRESSION: 1. No acute cardiopulmonary abnormalities. 2. Right lower lobe lung mass compatible with malignancy. 3. Interval decrease in pneumoperitoneum compared with the previous exam. Electronically Signed   By: Kimberley Penman M.D.   On: 02/29/2024 06:23   DG [facility] MEDICARE SPEECH PATH Result Date: 02/25/2024 CLINICAL DATA:  73 year old male with history of CVA, with esophageal dysphagia. EXAM: MODIFIED BARIUM SWALLOW TECHNIQUE: Different consistencies of barium were administered orally to the patient by the Speech Pathologist. Imaging of the pharynx was performed in the lateral projection. Charles care, PA-C was present in the fluoroscopy room during this study, which was supervised and interpreted by Dr. Myrlene Asper, MD. FLUOROSCOPY: Radiation Exposure Index (as provided by the fluoroscopic device): 12.30 mGy Kerma COMPARISON:  None Available. FINDINGS: Vestibular  Penetration:  Noted with thin liquids Aspiration:  Noted with thin liquids Other: Patient experienced difficulty swallowing soft foods as well, with ineffective swallow reflex. Early spillage. IMPRESSION: Vestibular penetration with aspiration noted with thin liquids. Poor swallowing reflex noted with soft foods. Please refer to the Speech Pathologists report for complete details and recommendations. Electronically Signed   By: Myrlene Asper D.O.   On: 02/25/2024 15:05   DG Chest Port 1 View Result Date: 02/23/2024 CLINICAL DATA:  Port-A-Cath placement. EXAM: PORTABLE CHEST 1 VIEW COMPARISON:  Feb 16, 2024. FINDINGS: The heart size and mediastinal contours are within normal limits. Left subclavian Port-A-Cath is noted with distal tip in expected position of the SVC. No pneumothorax is noted. Stable large right lower lobe mass is noted concerning for malignancy. Free air is noted under both hemidiaphragms consistent with gastrostomy placement today. The visualized skeletal  structures are unremarkable. IMPRESSION: Left subclavian Port-A-Cath is noted with distal tip in expected position of the SVC. Stable right lower lobe mass is noted consistent with malignancy. Electronically  Signed   By: Rosalene Colon M.D.   On: 02/23/2024 12:16   DG C-Arm 1-60 Min-No Report Result Date: 02/23/2024 Fluoroscopy was utilized by the requesting physician.  No radiographic interpretation.   DG Chest Port 1 View Result Date: 02/16/2024 CLINICAL DATA:  Status post bronchoscopy, right lower lobe mass EXAM: PORTABLE CHEST 1 VIEW COMPARISON:  02/01/2024 FINDINGS: 7.2 cm right lower lobe mass is again observed. No pneumothorax or pneumomediastinum. Low lung volumes. Thoracic spondylosis. Heart size within normal limits. IMPRESSION: 1. No pneumothorax or pneumomediastinum. 2. 7.2 cm right lower lobe mass. 3. Low lung volumes. Electronically Signed   By: Freida Jes M.D.   On: 02/16/2024 11:58   DG C-Arm 1-60 Min-No Report Result Date: 02/16/2024 Fluoroscopy was utilized by the requesting physician.  No radiographic interpretation.   MR BRAIN W WO CONTRAST Result Date: 02/09/2024 CLINICAL DATA:  Initial metastatic disease evaluation. EXAM: MRI HEAD WITHOUT AND WITH CONTRAST TECHNIQUE: Multiplanar, multiecho pulse sequences of the brain and surrounding structures were obtained without and with intravenous contrast. CONTRAST:  8mL GADAVIST  GADOBUTROL  1 MMOL/ML IV SOLN COMPARISON:  Comparison made with prior MRIs from 02/01/2024 and 02/05/2024. FINDINGS: Brain: Cerebral volume within normal limits. No significant cerebral white matter disease for age. No evidence for acute or subacute infarct. No areas of chronic cortical infarction. No acute or significant chronic intracranial blood products. Abnormal nodular thickening and enhancement seen about multiple cranial nerves at the skull base. Changes are most pronounced about the nerve root entry zones of the trigeminal nerves bilaterally (series  18, image 59). Prominent involvement of the seventh and 8 nerve cranial nerve complexes as well (series 18, image 48). Involvement of the hypoglossal nerves. Subtle nodular left a meningeal enhancement seen about the cerebellum, most pronounced at the vermis (series 18, images 75, 68, 61). Thickening and enhancement about the pituitary stalk (series 18, image 73). 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 Mild associated edema within the brain parenchyma itself of the pons left middle cerebellar peduncle (series 11, image 18). No other significant mass effect or associated edema. No other mass lesion or abnormal enhancement. Ventricles normal size without hydrocephalus. No extra-axial fluid collections. No other abnormal enhancement. Vascular: Major intracranial vascular flow voids are maintained. Skull and upper cervical spine: Craniocervical junction within normal limits. Bone marrow signal intensity within normal limits. No focal marrow replacing lesion. No scalp soft tissue abnormality. Sinuses/Orbits: Prior ocular lens replacement on the left. Questionable focus of asymmetric enhancement at the posterior aspect of the left lobe near the optic nerve insertion (series 18, image 63). Orbital soft tissues otherwise unremarkable. Paranasal sinuses are largely clear. No significant mastoid effusion. Other: None. IMPRESSION: 1. Abnormal nodular thickening and enhancement about multiple cranial nerves at the skull base and pituitary stalk, with additional subtle leptomeningeal enhancement about the cerebellum. 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 2. Mild associated edema within the brain parenchyma of the pons/left middle cerebellar peduncle. 3. Questionable focus of asymmetric enhancement at the posterior aspect of the left globe near the optic nerve insertion. Correlation with fundoscopic examination suggested.  Additionally, attention at follow-up recommended. 4. No other acute intracranial abnormality. Electronically Signed   By: Virgia Griffins M.D.   On: 02/09/2024 20:52   MR THORACIC SPINE W WO CONTRAST Result Date: 02/09/2024 CLINICAL DATA:  Initial metastatic disease evaluation. EXAM: MRI THORACIC WITHOUT AND WITH CONTRAST TECHNIQUE: Multiplanar and multiecho pulse sequences of the thoracic spine were obtained without and with  intravenous contrast. CONTRAST:  8mL GADAVIST  GADOBUTROL  1 MMOL/ML IV SOLN COMPARISON:  Prior MRI from 02/05/2024. FINDINGS: Alignment: Trace scoliosis. Alignment otherwise normal with preservation of the normal thoracic kyphosis. No listhesis. Vertebrae: Vertebral body height maintained without acute or chronic fracture. Bone marrow signal intensity within normal limits. Benign hemangioma noted within the T3 vertebral body. No evidence for osseous metastatic disease. Degenerative reactive endplate change with marrow edema present about the T12-L1 interspace. No other abnormal marrow edema or enhancement. Cord: Few small nodular foci of enhancement seen about the visualized distal conus/cauda equina at the level of L1, concerning for leptomeningeal disease as seen on prior MRI of the lumbar spine (series 24, images 8, 9, 10). No other evidence for intracanalicular metastatic disease within the thoracic spine. Cord itself demonstrates normal signal and morphology. Paraspinal and other soft tissues: Paraspinous soft tissues within normal limits. Patient's known right lung mass partially visualized. Disc levels: T1-2: Negative interspace.  Mild facet hypertrophy.  No stenosis. T2-3: Minimal disc bulge. Mild facet hypertrophy. No spinal stenosis. Mild bilateral foraminal narrowing. T3-4:  Negative interspace.  Mild facet hypertrophy.  No stenosis. T4-5: Negative interspace. Mild facet hypertrophy. No spinal stenosis. Mild right foraminal narrowing. T5-6: Negative interspace. Mild facet  hypertrophy. No significant stenosis. T6-7: Left paracentral disc protrusion indents the left ventral thecal sac. Mild facet hypertrophy. No stenosis. T7-8: Left paracentral disc extrusion with inferior migration indents the ventral thecal sac. Flattening of the left ventral cord without cord signal changes or significant spinal stenosis. Foramina remain patent. T8-9: Left paracentral disc extrusion with possible superior migration. Flattening of the left ventral cord without cord signal changes or significant spinal stenosis. Foramina remain patent. T9-10: Left paracentral disc extrusion with superior migration. Flattening of the left ventral cord without cord signal changes or significant spinal stenosis. Mild bilateral facet hypertrophy with resultant mild right foraminal stenosis. Left neural foramina remains adequately patent. T10-11: Minimal disc bulge. Mild facet hypertrophy. No spinal stenosis. Mild right with moderate left foraminal stenosis. T11-12: Diffuse disc bulge with endplate spurring. Mild facet hypertrophy. No significant stenosis. T12-L1: Degenerative disc space narrowing diffuse disc bulge and disc desiccation. Reactive endplate spurring. Moderate facet hypertrophy. Resultant mild-to-moderate spinal stenosis with moderate bilateral foraminal narrowing. IMPRESSION: 1. Few small nodular foci of enhancement about the visualized distal conus/cauda equina at the level of L1, concerning for leptomeningeal disease as seen on prior MRI of the lumbar spine. No other evidence for intracanalicular or osseous metastatic disease within the thoracic spine. 2. Solid right lung mass, partially visualized, and better characterized on recent chest CT. 3. 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 4. Multifactorial degenerative changes with resultant multilevel foraminal narrowing as above. Notable findings include mild right foraminal stenosis at T4-5 and T9-10, with  moderate left foraminal narrowing at T10-11. Electronically Signed   By: Virgia Griffins M.D.   On: 02/09/2024 18:31   MR CERVICAL SPINE W WO CONTRAST Result Date: 02/09/2024 CLINICAL DATA:  Initial evaluation for metastatic disease evaluation. EXAM: MRI CERVICAL SPINE WITHOUT AND WITH CONTRAST TECHNIQUE: Multiplanar and multiecho pulse sequences of the cervical spine, to include the craniocervical junction and cervicothoracic junction, were obtained without and with intravenous contrast. CONTRAST:  8mL GADAVIST  GADOBUTROL  1 MMOL/ML IV SOLN COMPARISON:  Prior MRI from 02/05/2024. FINDINGS: Alignment: Mild dextroscoliosis with straightening of the normal cervical lordosis. Trace degenerative anterolisthesis of C2 on C3 and C3 on C4. Vertebrae: Vertebral body height maintained without acute or chronic fracture. Bone marrow signal  intensity within normal limits. Small benign hemangioma noted within the T3 vertebral body. No evidence for osseous metastatic disease. No abnormal marrow edema or enhancement. Cord: Normal signal and morphology. No evidence for intracanalicular metastatic disease within the cervical spine. Posterior Fossa, vertebral arteries, paraspinal tissues: Abnormal thickening and enhancement seen about multiple cranial nerves at the visualized skull base (series 12, images 6, 15). 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 Degenerative thickening noted about the tectorial membrane without significant stenosis at the craniocervical junction. Paraspinous soft tissues within normal limits. Normal flow voids seen within the vertebral arteries bilaterally. Disc levels: C2-C3: Trace anterolisthesis. Mild uncovertebral spurring without significant disc bulge. Moderate left with mild right facet hypertrophy. No significant spinal stenosis. Mild left C3 foraminal narrowing. Right neural foramina remains patent. C3-C4: Trace anterolisthesis.  Small left paracentral disc protrusion mildly indents the ventral thecal sac. Left greater than right uncovertebral and facet hypertrophy. Mild spinal stenosis. Severe left with mild right C4 foraminal narrowing. C4-C5: Disc bulge with moderate right with mild left C5 foraminal stenosis. C5-C6: Degenerative disc space narrowing with diffuse disc osteophyte complex. Flattening and partial effacement of the ventral thecal sac. Superimposed facet hypertrophy. Moderate spinal stenosis with severe right worse than left C6 foraminal narrowing. C6-C7: Degenerative disc space narrowing with diffuse disc osteophyte complex. Flattening and partial effacement of the ventral thecal sac. Superimposed mild facet hypertrophy. Moderate spinal stenosis with severe bilateral C7 foraminal narrowing. C7-T1: Tiny left paracentral disc protrusion. Mild facet hypertrophy. No spinal stenosis. Foramina remain adequately patent. IMPRESSION: 1. 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 2. No other evidence for osseous or intracanalicular metastatic disease within the cervical spine. 3. 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 4. Multifactorial degenerative changes with resultant multilevel foraminal narrowing as above. Notable findings include severe left C4 foraminal stenosis, moderate right C5 foraminal narrowing, severe bilateral C6 and C7 foraminal stenosis, with mild left C3 foraminal narrowing. Electronically Signed   By: Virgia Griffins M.D.   On: 02/09/2024 18:19   MR Lumbar Spine W Wo Contrast Result Date: 02/05/2024 CLINICAL DATA:  Lung mass. Prostate cancer. Melanoma. Abnormal PET scan. EXAM: MRI LUMBAR SPINE WITHOUT AND WITH CONTRAST TECHNIQUE: Multiplanar and multiecho pulse sequences of the lumbar spine were  obtained without and with intravenous contrast. CONTRAST:  7.5mL GADAVIST  GADOBUTROL  1 MMOL/ML IV SOLN COMPARISON:  Whole body PET scan 02/04/2024 FINDINGS: Segmentation: 5 non rib-bearing lumbar type vertebral bodies are present. The lowest fully formed vertebral body is L5. Alignment: Slight retrolisthesis present at L1-2 and L2-3. Grade 1 anterolisthesis is present at L5-S1. Straightening of the normal lumbar lordosis is present. Levoconvex curvature is centered at L4-5. Vertebrae: Mixed type 1 and type 2 Modic changes are present at L3-4. Edematous changes are present on the right. More chronic type 2 Modic changes are present posteriorly on the right at L5-S1 and posteriorly on the right at L2-3. Edematous endplate changes are present posteriorly at L1-2. Vertebral body heights are maintained. No osseous metastases are present. Conus medullaris and cauda equina: Conus extends to the L2 level. Diffuse dural enhancement is present. Nodular enhancement is present within the conus medullaris. A dorsal and right lateral nodule is present at L2 adjacent to the conus. Ventral nodule and posterior dural enhancement is present at the L2-3 level. A dorsal or dural nodule is present at L4.  A 7 mm enhancing nodule is present laterally at the L4-5 disc level, corresponding to the traversing left L5 nerve roots. Paraspinal and other soft tissues: An 18 mm simple cyst is noted at the lower pole of the right kidney. No other solid organ lesions are present. No significant adenopathy is present. Disc levels: T12-L1: Mild disc bulging is present without focal stenosis. L1-2: A right paramedian disc protrusion is present. Mild central canal stenosis is present. Disc extends into the foramina bilaterally. Moderate bilateral foraminal stenosis is present. L2-3: A leftward disc protrusion is present. Moderate facet hypertrophy is seen bilaterally. This results an central canal stenosis with moderate left and mild right subarticular  narrowing. Moderate foraminal narrowing is worse on the left. L3-4: A broad-based disc protrusion is present. Moderate facet hypertrophy is noted bilaterally. This results in moderate to severe central canal stenosis. Severe right and moderate left foraminal stenosis is present. L4-5: A broad-based disc protrusion is present. Moderate facet hypertrophy is noted bilaterally. Moderate foraminal narrowing is present bilaterally. L5-S1: Chronic loss of disc height is present. Moderate facet hypertrophy is worse on the right. Mild right subarticular narrowing is present. Moderate foraminal stenosis is worse right than left. IMPRESSION: 1. Diffuse dural enhancement with nodular enhancement within the conus medullaris. 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 2. 7 mm enhancing nodule laterally at the L4-5 disc level, corresponding to the traversing left L5 nerve roots. 3. No osseous metastases. 4. Multilevel spondylosis of the lumbar spine as described. 5. Mild central canal stenosis at L1-2. 6. Moderate bilateral foraminal stenosis at L1-2. 7. Moderate left and mild right subarticular narrowing at L2-3. 8. Moderate to severe central canal stenosis at L3-4 with severe right and moderate left foraminal stenosis. 9. Moderate foraminal narrowing bilaterally at L4-5 and L5-S1. These results will be called to the ordering clinician or representative by the Radiologist Assistant, and communication documented in the PACS or [employer]. Electronically Signed   By: Audree Leas M.D.   On: 02/05/2024 19:41   NM PET Image Initial (PI) Skull Base To Thigh (F-18 FDG) Result Date: 02/04/2024 CLINICAL DATA:  Initial treatment strategy for lung nodule. Additional history of prostate cancer and melanoma. EXAM: NUCLEAR MEDICINE PET SKULL BASE TO THIGH TECHNIQUE: 10.31 mCi F-18 FDG was injected intravenously. Full-ring PET imaging was performed from the skull base to thigh after the radiotracer. CT  data was obtained and used for attenuation correction and anatomic localization. Fasting blood glucose: 148 mg/dl COMPARISON:  PET-CT report November 2015.  Chest CT 02/01/2024 FINDINGS: Mediastinal blood pool activity: SUV max 3.1 Liver activity: SUV max 3.4 NECK: No specific abnormal radiotracer uptake seen including along lymph node chains of the submandibular, posterior triangle or internal jugular region. Near symmetric uptake of the visualized intracranial compartment. Incidental CT findings: Visualized paranasal sinuses and mastoid air cells are clear. Streak artifact related to patient's dental hardware. The parotid glands, submandibular glands and thyroid  glands unremarkable. CHEST: Large heterogeneous right lower lobe lung mass again identified corresponding to the 6.7 cm lesion seen on the recent CT scan of the chest with maximum SUV value of 21.1 worrisome for neoplasm. No additional areas of abnormal uptake along the lung parenchyma. There is some mild uptake along the right infrahilar region which may be small nodes. The right lower lobe mass as abut the hilum these nodes have maximum SUV value of 8.2. No additional areas of abnormal uptake elsewhere in the chest including mediastinum, left hilum or axillary regions.  The subcarinal nodes described on the CT scan have lower level of uptake with maximum SUV of 3.4. Incidental CT findings: Scattered vascular calcifications are seen including along the coronary arteries. No pericardial effusion. The thoracic aorta is normal course and caliber. Breathing motion. No pleural effusion or pneumothorax. Calcified tiny nodules in the left lung are again noted as on prior. ABDOMEN/PELVIS: There is physiologic distribution radiotracer along the parenchymal organs, bowel and renal collecting systems. Incidental CT findings: Nonobstructing midportion left-sided renal stone. Right-sided central renal cyst. Contracted urinary bladder. Large bowel has a normal course  and caliber with scattered stool. Left-sided colonic diverticula. Normal appendix. Gallbladder is nondilated with dependent stones. Nonspecific perinephric stranding, right greater than left. SKELETON: Focal area of abnormal uptake seen along the left hip greater trochanter with faint area of sclerosis. Maximum SUV value of 8.4 worrisome for a skeletal metastasis. Is also focus of uptake with maximum SUV value of 8.4 corresponding to the central canal of the spine at the L3-4 level. Small soft tissue nodule in this location on CT image 104. This has a differential. Incidental CT findings: Scattered degenerative changes along the spine. Multilevel degenerative changes particularly with stenosis in the lumbar region. Trace listhesis. Degenerative changes as well of the shoulders and pelvis. IMPRESSION: Large hypermetabolic right lower lobe lung mass as seen on prior CT exam scanned extending to the hilum consistent with neoplasm. Uptake in the right hilum could represent some adjacent nodes. Hypermetabolic sclerotic lesion involving the left hip greater trochanter worrisome for a skeletal metastasis. Focus area of uptake along the central canal of the spine at L3-4 left of midline is also seen. This would have a differential. An additional metastatic lesion is 1 of those differential. Recommend further workup with lumbar spine MRI with and without contrast to further delineate. Electronically Signed   By: Adrianna Horde M.D.   On: 02/04/2024 11:56   CT Chest W Contrast Result Date: 02/01/2024 CLINICAL DATA:  Respiratory illness, nondiagnostic xray Ongoing cough post COVID infection with difficulty swelling for 1 month. Right lung mass on radiographs concerning for malignancy. History of melanoma. * Tracking Code: BO * EXAM: CT CHEST WITH CONTRAST TECHNIQUE: Multidetector CT imaging of the chest was performed during intravenous contrast administration. RADIATION DOSE REDUCTION: This exam was performed according to the  departmental dose-optimization program which includes automated exposure control, adjustment of the mA and/or kV according to patient size and/or use of iterative reconstruction technique. CONTRAST:  75mL OMNIPAQUE  IOHEXOL  300 MG/ML  SOLN COMPARISON:  Chest radiographs same date. No other recent imaging. Remote PET-CT 09/05/2014. FINDINGS: Cardiovascular: No acute vascular findings. There is atherosclerosis of the aorta, great vessels and coronary arteries. There are calcifications of the aortic valve. The heart size is normal. There is no pericardial effusion. Mediastinum/Nodes: There are newly enlarged right hilar and subcarinal lymph nodes, including a 1.4 cm short axis subcarinal node on image 73/2. Right hilar lymph nodes measuring up to 1.7 x 1.0 cm on image 66/2 are partially calcified. Stable postsurgical changes in the right axilla from previous axillary node dissection. No enlarged axillary lymph nodes are identified. The thyroid  gland, trachea and esophagus demonstrate no significant findings. Lungs/Pleura: No pleural effusion or pneumothorax. As demonstrated on earlier chest radiographs, there is a large well-circumscribed solid appearing mass in the right lower lobe which measures approximately 6.7 x 6.0 cm on image 82/4. Mild surrounding posterior obstructive pneumonitis and central airway thickening. No other suspicious pulmonary nodules are identified. There are small calcified  granulomas adjacent to the left major fissure. Upper abdomen: Diffuse hepatic steatosis. No focal abnormalities identified. There is no evidence of adrenal mass. Musculoskeletal/Chest wall: There is no chest wall mass or suspicious osseous finding. Mild spondylosis. IMPRESSION: 1. Large well-circumscribed solid appearing mass in the right lower lobe with mild surrounding posterior obstructive pneumonitis and central airway thickening. Findings are not typical for pneumonia and are concerning for malignancy. Differential  includes primary bronchogenic carcinoma and metastatic melanoma given the patient's history. 2. Newly enlarged right hilar and subcarinal lymph nodes, suspicious for metastatic disease. PET-CT may be helpful for further evaluation. 3. No other evidence of metastatic disease. 4. Hepatic steatosis. 5. Aortic atherosclerosis. Electronically Signed   By: Elmon Hagedorn M.D.   On: 02/01/2024 18:41   MR BRAIN WO CONTRAST Result Date: 02/01/2024 CLINICAL DATA:  Neuro deficit, acute, stroke suspected. Facial numbness and difficulty swallowing. EXAM: MRI HEAD WITHOUT CONTRAST TECHNIQUE: Multiplanar, multiecho pulse sequences of the brain and surrounding structures were obtained without intravenous contrast. COMPARISON:  Head CT 02/01/2024 FINDINGS: Brain: There is no evidence of an acute infarct, intracranial hemorrhage, mass, midline shift, or extra-axial fluid collection. Cerebral volume is normal. The ventricles are normal in size. No significant white matter disease is seen for age. Vascular: Major intracranial vascular flow voids are preserved. Skull and upper cervical spine: Unremarkable brain marrow signal. Sinuses/Orbits: Left cataract extraction. Minimal mucosal thickening or small mucous retention cysts in the maxillary sinuses. Clear mastoid air cells. Other: None. IMPRESSION: Unremarkable appearance of the brain for age. Electronically Signed   By: Aundra Lee M.D.   On: 02/01/2024 16:15   DG Chest 2 View Result Date: 02/01/2024 CLINICAL DATA:  Ongoing cough post COVID. EXAM: CHEST - 2 VIEW COMPARISON:  PET-CT 09/05/2014 FINDINGS: Normal cardiac and mediastinal contours. There is a 7.7 x 6.6 cm mass projecting over the right lower lung. Minimal heterogeneous opacities left lung base. Surgical clips right axilla. Thoracic spine degenerative changes. IMPRESSION: 1. 7.7 cm mass projecting over the right lower lung. Possibility of malignant process not excluded. Less likely findings represent infectious  process. Recommend further evaluation with chest CT. 2. Minimal heterogeneous opacities left lung base may represent atelectasis or infection. Electronically Signed   By: Jone Neither M.D.   On: 02/01/2024 12:37   CT Head Wo Contrast Result Date: 02/01/2024 CLINICAL DATA:  Neuro deficit, concern for stroke, difficulty chewing and swallowing, facial numbness for 1 month. EXAM: CT HEAD WITHOUT CONTRAST TECHNIQUE: Contiguous axial images were obtained from the base of the skull through the vertex without intravenous contrast. RADIATION DOSE REDUCTION: This exam was performed according to the departmental dose-optimization program which includes automated exposure control, adjustment of the mA and/or kV according to patient size and/or use of iterative reconstruction technique. COMPARISON:  None Available. FINDINGS: Brain: No acute intracranial hemorrhage. No CT evidence of acute infarct. No edema, mass effect, or midline shift. The basilar cisterns are patent. Ventricles: The ventricles are normal. Vascular: Atherosclerotic calcifications of the carotid siphons. No hyperdense vessel. Skull: No acute or aggressive finding. Orbits: Left lens replacement.  Orbits are otherwise unremarkable. Sinuses: The visualized paranasal sinuses are clear. Other: Mastoid air cells are clear. IMPRESSION: No CT evidence of acute intracranial abnormality. Electronically Signed   By: Denny Flack M.D.   On: 02/01/2024 10:53    Assessment and plan- Patient is a 73 y.o. male with metastatic malignant melanoma leptomeningeal carcinomatosis admitted for aspiration pneumonia and acute encephalopathy.  Patient received cycle 1 of ipilimumab   and nivolumab  2 weeks ago.  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  Clinically his condition has worsened over the last 3 weeks now complicated by acute encephalopathy and aspiration pneumonia.  I met with the patient  and his family at the bedside.  They understand that overall his clinical condition is deteriorating rapidly.  Prognosis is likely hours to days.  Palliative care NP Gerilyn Kobus also met with the patient and family and patient agreed to be DNR.  Discussed with the family that continued interventions including labs, antibiotics will unfortunately not improve his quality of life or survival.  They are in agreement in keeping patient comfortable and proceeding with comfort care orders.  I would recommend that patient should be observed for the next 24 to 48 hours in the hospital.  There is a high likelihood that he can pass during this hospital stay.  However if his condition stabilizes it would be reasonable to explore discharge options including home with hospice versus inpatient hospice home.     Visit Diagnosis 1. SOB (shortness of breath)   2. Acute respiratory failure with hypoxia (HCC)     Dr. Seretha Dance, MD, MPH [facility] [phone] 03/01/2024

## 2024-03-01 NOTE — ED Notes (Signed)
 Pt cleaned, linens changed, new condom cath placed, pt suctioned, vs updated

## 2024-03-02 ENCOUNTER — Inpatient Hospital Stay

## 2024-03-02 DIAGNOSIS — Z7189 Other specified counseling: Secondary | ICD-10-CM

## 2024-03-02 DIAGNOSIS — Z515 Encounter for palliative care: Secondary | ICD-10-CM | POA: Diagnosis not present

## 2024-03-02 DIAGNOSIS — J9601 Acute respiratory failure with hypoxia: Secondary | ICD-10-CM | POA: Diagnosis not present

## 2024-03-02 DIAGNOSIS — G9340 Encephalopathy, unspecified: Secondary | ICD-10-CM

## 2024-03-02 DIAGNOSIS — J690 Pneumonitis due to inhalation of food and vomit: Secondary | ICD-10-CM | POA: Diagnosis not present

## 2024-03-02 DIAGNOSIS — E43 Unspecified severe protein-calorie malnutrition: Secondary | ICD-10-CM | POA: Insufficient documentation

## 2024-03-02 MED ORDER — GLYCOPYRROLATE 0.2 MG/ML IJ SOLN
0.2000 mg | INTRAMUSCULAR | Status: DC
  Administered 2024-03-02 – 2024-03-03 (×8): 0.2 mg via INTRAVENOUS
  Filled 2024-03-02 (×8): qty 1

## 2024-03-02 NOTE — Progress Notes (Signed)
 Palliative Medicine [facility] at [facility] Telephone:([phone]) [phone] [phone]) [phone]   Name: Mason Stewart Date: 03/02/2024 MRN: [id number]  DOB: Jan 20, 1951  Patient Care Team: Helaine Llanos, MD as PCP - General (Internal Medicine) Arlen Lacks, Harrell Lima, MD as Referring Physician (Ophthalmology) Gaynelle Keeling, MD as Consulting Physician (Dermatology) Drake Gens, RN as Oncology Nurse Navigator    REASON FOR CONSULTATION: Mason Stewart is a 73 y.o. male with multiple medical problems including history of melanoma and prostate cancer, now with stage IV melanoma with leptomeningeal spread.  Patient was started on immunotherapy.  Underwent PEG placement due to severe dysphagia and poor oral intake.  Patient now admitted to the hospital with aspiration pneumonia.  Palliative care consulted to address goals.   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 Cornett, MD;  Location: [facility];  Service: General;  Laterality: Right;   BACK SURGERY  10/14/1995   lower back   BRONCHOSCOPY, WITH BIOPSY USING ELECTROMAGNETIC NAVIGATION Bilateral 02/16/2024   Procedure: ROBOTIC ASSISTED NAVIGATIONAL BRONCHOSCOPY;  Surgeon:  Vergia Glasgow, MD;  Location: [facility];  Service: Pulmonary;  Laterality: Bilateral;   CARPAL TUNNEL RELEASE Right 03/14/2023   colonscopy  10/13/2010   CREATION, GASTROSTOMY, OPEN N/A 02/23/2024   Procedure: CREATION, GASTROSTOMY, OPEN;  Surgeon: Emmalene Hare, MD;  Location: [facility];  Service: General;  Laterality: N/A;   EXCISION MELANOMA WITH SENTINEL LYMPH NODE BIOPSY Right 11/23/2013   Procedure: EXCISION MELANOMA WITH SENTINEL LYMPH NODE BIOPSY;  Surgeon: Brandy Cal. Cornett, MD;  Location: [facility];  Service: General;  Laterality: Right;   EYE SURGERY     cataract left, reduced pressure in left   left middle finger surgery after injury  yrs ago   PORTACATH PLACEMENT N/A 02/23/2024   Procedure: INSERTION, TUNNELED CENTRAL VENOUS DEVICE, WITH PORT;  Surgeon: Emmalene Hare, MD;  Location: [facility];  Service: General;  Laterality: N/A;   ROBOT ASSISTED LAPAROSCOPIC RADICAL PROSTATECTOMY  06/07/2012   Procedure: ROBOTIC ASSISTED LAPAROSCOPIC RADICAL PROSTATECTOMY LEVEL 2;  Surgeon: Kristeen Peto, MD;  Location: [facility];  Service: Urology;  Laterality: N/A;        HEMATOLOGY/ONCOLOGY HISTORY:  Oncology History Overview Note  Melanoma of thoracic region   Primary site: Melanoma of the Skin (Right)   Staging method: AJCC 7th Edition   Clinical: Stage III (T2, N1, M0) signed by Almeda Jacobs, MD on 12/23/2013  9:55 AM   Pathologic: (T2, N1, cM0) signed by Almeda Jacobs, MD on 12/23/2013  9:56 AM   Summary: Stage III (T2, N1, cM0)  Also history of prostate cancer T2cN0M0 status post prostatectomy.   Melanoma of thoracic region [facility]) (  Resolved)  06/07/2012 Surgery   The patient underwent prostate surgery which show Gleason 3+4 involving both lobes.   10/20/2013 Procedure   The patient underwent biopsy to confirm superficial spreading melanoma, 1.82 mm thickness   11/23/2013 Surgery   The patient underwent wide local excision and sentinel lymph node biopsy which showed 1 lymph node  involvement.   12/13/2013 Surgery   The patient underwent in complete lymph node dissection did show no evidence of disease.   02/20/2014 Imaging   Staging PET CT scan show no evidence of disease recurrence.   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  MEDICATIONS:  Current Facility-Administered Medications  Medication Dose Route Frequency Provider Last Rate Last Admin   acetaminophen  (TYLENOL ) 160 MG/5ML solution 960 mg  960 mg Per Tube Q6H PRN [place], MD       Chlorhexidine  Gluconate Cloth 2 % PADS 6 each  6 each Topical Daily Sreenath, Sudheer B, MD       dorzolamide -timolol  (COSOPT ) 2-0.5 % ophthalmic solution 1 drop  1 drop Both Eyes BID Antoniette Batty T, MD   1 drop at 03/01/24 1007   glycopyrrolate  (ROBINUL ) injection 0.2 mg  0.2 mg Intravenous Q4H Ree Candy, MD   0.2 mg at 03/02/24 1421   haloperidol  lactate (HALDOL ) injection 2 mg  2 mg Intravenous Q6H PRN [place], MD       latanoprost  (XALATAN ) 0.005 % ophthalmic solution 1 drop  1 drop Both Eyes BID Antoniette Batty T, MD   1 drop at 03/01/24 1007   LORazepam (ATIVAN) injection 1-2 mg  1-2 mg Intravenous Q4H PRN Nichlas Pitera R, NP   1 mg at 03/02/24 1251   morphine  (PF) 2 MG/ML injection 2 mg  2 mg Intravenous Q1H PRN Ronasia Isola R, NP       sodium chloride  flush (NS) 0.9 % injection 10-40 mL  10-40 mL Intracatheter PRN Mosetta Areola, Sudheer B, MD        VITAL SIGNS: BP (!) 165/110 (BP Location: Left Arm)   Pulse (!) 132   Temp 100.2 F (37.9 C)   Resp (!)  22   Ht 5\' 6"  (1.676 m)   Wt 164 lb 3.9 oz (74.5 kg)   SpO2 (!) 89%   BMI 26.51 kg/m  Filed Weights   02/29/24 0553 03/02/24 0516  Weight: 170 lb 8.4 oz (77.3 kg) 164 lb 3.9 oz (74.5 kg)    Estimated body mass index is 26.51 kg/m as calculated from the following:   Height as of this encounter: 5\' 6"  (1.676 m).   Weight as of this encounter: 164 lb 3.9 oz (74.5 kg).  LABS: CBC:    Component Value Date/Time   WBC 18.3 (H) 03/01/2024 0432   HGB 11.6 (L) 03/01/2024 0432   HGB 12.5 (L) 02/26/2024 1305   HGB 15.5 09/26/2014 0832   HCT 32.0 (L) 03/01/2024 0432   HCT 43.2 09/26/2014 0832   PLT 252 03/01/2024 0432   PLT 296 02/26/2024  1305   PLT 223 09/26/2014 0832   MCV 85.3 03/01/2024 0432   MCV 86.6 09/26/2014 0832   NEUTROABS 31.0 (H) 02/29/2024 0557   NEUTROABS 5.4 09/26/2014 0832   LYMPHSABS 0.8 02/29/2024 0557   LYMPHSABS 1.0 09/26/2014 0832   MONOABS 1.9 (H) 02/29/2024 0557   MONOABS 0.5 09/26/2014 0832   EOSABS 0.2 02/29/2024 0557   EOSABS 0.2 09/26/2014 0832   BASOSABS 0.1 02/29/2024 0557   BASOSABS 0.0 09/26/2014 0832   Comprehensive Metabolic Panel:    Component Value Date/Time   NA 135 03/01/2024 0432   NA 138 02/03/2020 0000   NA 139 09/26/2014 0832   K 2.8 (L) 03/01/2024 0432   K 3.9 09/26/2014 0832   CL 102 03/01/2024 0432   CO2 23 03/01/2024 0432   CO2 22 09/26/2014 0832   BUN 12 03/01/2024 0432   BUN 12 02/03/2020 0000   BUN 13.3 09/26/2014 0832   CREATININE 0.54 (L) 03/01/2024 0432   CREATININE 0.70 02/26/2024 1304   CREATININE 1.0 09/26/2014 0832   GLUCOSE 143 (H) 03/01/2024 0432   GLUCOSE 197 (H) 09/26/2014 0832   CALCIUM  7.8 (L) 03/01/2024 0432   CALCIUM  9.0 09/26/2014 0832   AST 18 02/29/2024 0557   AST 19 02/26/2024 1304   AST 19 09/26/2014 0832   ALT 16 02/29/2024 0557   ALT 14 02/26/2024 1304   ALT 28 09/26/2014 0832   ALKPHOS 76 02/29/2024 0557   ALKPHOS 69 09/26/2014 0832   BILITOT 2.1 (H) 02/29/2024 0557   BILITOT 1.1  02/26/2024 1304   BILITOT 0.92 09/26/2014 0832   PROT 7.4 02/29/2024 0557   PROT 6.5 09/26/2014 0832   ALBUMIN 3.7 02/29/2024 0557   ALBUMIN 3.8 09/26/2014 0832    RADIOGRAPHIC STUDIES: CT SUPER D CHEST WO CONTRAST Result Date: 02/29/2024 CLINICAL DATA:  Right lower lobe lung mass.  Pre bronchoscopy. EXAM: CT CHEST WITHOUT CONTRAST TECHNIQUE: Multidetector CT imaging of the chest was performed using thin slice collimation for electromagnetic bronchoscopy planning purposes, without intravenous contrast. RADIATION DOSE REDUCTION: This exam was performed according to the departmental dose-optimization program which includes automated exposure control, adjustment of the mA and/or kV according to patient size and/or use of iterative reconstruction technique. COMPARISON:  PET 02/04/2024 and CT chest 02/01/2024. FINDINGS: Cardiovascular: Atherosclerotic calcification of the aorta, aortic valve and coronary arteries. Heart is at the upper limits of normal in size. No pericardial effusion. Mediastinum/Nodes: No pathologically enlarged mediastinal or axillary lymph nodes. Hilar regions are difficult to definitively evaluate without IV contrast. Subcarinal lymph node measures 11 mm, previously 14 mm. Surgical clips in the right axilla. Air in the esophagus can be seen with dysmotility. Lungs/Pleura: Calcified granulomas. Right lower lobe mass measures 5.8 x 6.9 cm, stable. Patchy peribronchovascular ground-glass and nodularity in the surrounding right middle and right lower lobes. Additional peribronchovascular ground-glass in the lingula and left lower lobe. No pleural fluid. Airway is unremarkable. Upper Abdomen: Liver margin is slightly irregular. Visualized portions of the liver, gallbladder, adrenal glands, kidneys, spleen, pancreas, stomach and bowel are otherwise grossly unremarkable. No upper abdominal adenopathy. Musculoskeletal: Degenerative changes in the spine. No worrisome lytic or sclerotic lesions.  IMPRESSION: 1. Stable right lower lobe mass, consistent with biopsy-proven metastatic melanoma. 2. Patchy peribronchovascular ground-glass and nodularity in the lung bases, right greater than left, of indeterminate etiology but possibly treatment related. 3. Liver appears mildly cirrhotic. 4. Aortic atherosclerosis (ICD10-I70.0). Coronary artery calcification. Electronically Signed   By: Shearon Denis M.D.   On:  02/29/2024 14:23   CT Angio Chest PE W and/or Wo Contrast Result Date: 02/29/2024 CLINICAL DATA:  Known right lower lobe mass/neoplasm, PET positive, presenting with increased weakness and hypoxia with increased work of breathing. The patient had an open gastrostomy creation 02/23/2024. EXAM: CT ANGIOGRAPHY CHEST CT ABDOMEN AND PELVIS WITH CONTRAST TECHNIQUE: Multidetector CT imaging of the chest was performed using the standard protocol during bolus administration of intravenous contrast. Multiplanar CT image reconstructions and MIPs were obtained to evaluate the vascular anatomy. Multidetector CT imaging of the abdomen and pelvis was performed using the standard protocol during bolus administration of intravenous contrast. RADIATION DOSE REDUCTION: This exam was performed according to the departmental dose-optimization program which includes automated exposure control, adjustment of the mA and/or kV according to patient size and/or use of iterative reconstruction technique. CONTRAST:  OMNIPAQUE  IOHEXOL  350 MG/ML SOLN COMPARISON:  Chest CT without contrast 02/15/2024, PET-CT 02/04/2024, and a portable chest from 02/23/2024 and today. FINDINGS: CTA CHEST FINDINGS Cardiovascular: There is a new small anterior pericardial effusion. The cardiac size is normal. There are left main and 2 vessel coronary calcifications in the LAD and right coronary artery, heaviest in the LAD. There is a new left chest implanted port, with subclavian approach catheter terminating at about the brachiocephalic/SVC  junction. There is atherosclerosis in the aorta and great vessels without aneurysm, dissection or stenosis. No pulmonary arterial dilatation or embolism is seen. Pulmonary veins are normal caliber. There is bronchovascular mass encasement and narrowing along the lower hilar area in the lower lobe. Mediastinum/Nodes: There are increasingly prominent lymph nodes in the azygoesophageal recess, largest 1.5 x 2.5 cm on 2:90. There are increasingly prominent right lower hilar lymph nodes difficult to separate out from the right lower lobe mass. Largest of these is estimated 1.9 cm short axis on 2:91. There is an enlarging right mid hilar node of 1.5 cm on 2: 76, additional subcarinal lymph nodes up to 1 cm. I do not see further adenopathy. Lungs/Pleura: There is moderate fluid consistent with aspiration in the trachea, tracking down into right middle and lower lobe bronchi. Fluid is also seen in the right upper lobe main bronchus and there is diffuse bronchial thickening. There are multiple subsegmental and a few segmental fluid opacified bronchi in the right lower lobe basal segments with the right lower lobe distal main bronchus narrowed by the tumor. There is scattered subsegmental bronchial impactions in the right middle lobe. Right lower lobe mass abutting the hilum is centered in the superior segment and measures 7 x 5.6 cm on 2:10, not larger than on 02/15/2024 but on 02/04/2024 measured 6.5 x 5.5 cm. There is patchy ground-glass airspace disease in the left upper lobe, patchy denser ground-glass intermixed with peribronchovascular nodular airspace disease in the right upper lobe, ill-defined scattered hazy airspace disease in left lower lobe. In the right lower lobe, in addition to a fluid impactions in multiple subsegmental and downstream bronchi, there is patchy bronchopneumonia greatest in the superior segment, with patchy tree-in-bud and ground-glass airspace disease in the right middle lobe. Findings  consistent with multilobar pneumonia. No fluid is seen in the left-sided bronchi. There are no pleural effusions. Musculoskeletal: Degenerative change thoracic spine. No regional bone metastasis. At T8-9 and T9-10 there small left paracentral calcified disc extrusions without overt mass effect. Review of the MIP images confirms the above findings. CT ABDOMEN and PELVIS FINDINGS Hepatobiliary: No liver metastasis is seen. Unremarkable gallbladder and bile ducts. Pancreas: No abnormality. Spleen: No abnormality. Adrenals/Urinary  Tract: No adrenal renal mass enhancement. There is a 2 cm right lower pole parapelvic Bosniak 2 cyst with a Hounsfield density of 27, not requiring follow-up imaging. There is a parapelvic cyst in the lower left kidney which is too small to characterize consistent with another Bosniak 2 cyst. There is a 3 mm caliceal stone in the midpole of the left kidney. No other urolithiasis is seen and no obstructing stones. Normal bladder. Stomach/Bowel: There is a PEG tube in place. The balloon is in the stomach. There is moderate free air in the upper abdomen, more than expected 6 days out from PEG placement. A gastric leak is not excluded but there is no free fluid. Furthermore no focal inflammatory changes seen around the where the tube enters the stomach. Clinical correlation follow-up recommended. No bowel dilatation or wall thickening is seen. The appendix is normal. There is sigmoid diverticulosis, without diverticulitis. Vascular/Lymphatic: Aortic atherosclerosis. No enlarged abdominal or pelvic lymph nodes. Reproductive: The prostate appears to have been removed. No mass is seen in the prostate bed. There are no surgical clips in the pelvic sidewalls. Other: None. Musculoskeletal: There is no focally destructive lesion. The PET-CT demonstrated abnormal activity in the posterior superior facet of the left greater trochanter concerning for metastatic disease, but which does not have a CT  correlate. There is degenerative change of the lumbar spine most advanced at the lowest 2 levels where there is acquired spinal stenosis. 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 Review of the MIP images confirms the above findings. IMPRESSION: 1. No evidence of pulmonary arterial dilatation or embolus. 2. 7 x 5.6 cm right lower lobe mass centered in the superior segment, not larger than on 02/15/2024 but slightly increased since 02/04/2024. 3. Aspiration fluid in the trachea, right middle and lower lobe bronchi, some in the right upper lobe main bronchus, with diffuse bronchial thickening and with multilobar pneumonia described in detail above, right greater than left. 4. Increasingly prominent mediastinal and right hilar lymph nodes. 5. New small anterior pericardial effusion. 6. Aortic and coronary artery atherosclerosis. 7. PEG tube in place with the balloon in the stomach. There is moderate free air in the upper abdomen more than expected 6 days out from PEG placement. A gastric leak is not excluded but there is no free fluid. Furthermore no focal inflammatory changes seen around the where the tube enters the stomach. Clinical correlation and follow-up recommended. 8. No evidence of metastatic disease in the abdomen or pelvis. 9. 3 mm nonobstructing left renal stone. 10. Diverticulosis without evidence of diverticulitis. 11. The PET-CT demonstrated abnormal activity in the posterior superior facet of the left greater trochanter concerning for metastatic disease, but which does not have a CT correlate. 12. Critical Value/emergent results were called by telephone at the time of interpretation on 02/29/2024 at 7:36 am to provider DR. Cleora Daft, who verbally acknowledged these results. Aortic Atherosclerosis (ICD10-I70.0). Electronically Signed   By: Denman Fischer M.D.   On: 02/29/2024 08:11   CT ABDOMEN PELVIS W CONTRAST Result Date: 02/29/2024 CLINICAL DATA:  Known right lower lobe mass/neoplasm, PET  positive, presenting with increased weakness and hypoxia with increased work of breathing. The patient had an open gastrostomy creation 02/23/2024. EXAM: CT ANGIOGRAPHY CHEST CT ABDOMEN AND PELVIS WITH CONTRAST TECHNIQUE: Multidetector CT imaging of the chest was performed using the standard protocol during bolus administration of intravenous contrast. Multiplanar CT image reconstructions and MIPs were obtained to evaluate the vascular anatomy. Multidetector CT imaging of the abdomen  and pelvis was performed using the standard protocol during bolus administration of intravenous contrast. RADIATION DOSE REDUCTION: This exam was performed according to the departmental dose-optimization program which includes automated exposure control, adjustment of the mA and/or kV according to patient size and/or use of iterative reconstruction technique. CONTRAST:  OMNIPAQUE  IOHEXOL  350 MG/ML SOLN COMPARISON:  Chest CT without contrast 02/15/2024, PET-CT 02/04/2024, and a portable chest from 02/23/2024 and today. FINDINGS: CTA CHEST FINDINGS Cardiovascular: There is a new small anterior pericardial effusion. The cardiac size is normal. There are left main and 2 vessel coronary calcifications in the LAD and right coronary artery, heaviest in the LAD. There is a new left chest implanted port, with subclavian approach catheter terminating at about the brachiocephalic/SVC junction. There is atherosclerosis in the aorta and great vessels without aneurysm, dissection or stenosis. No pulmonary arterial dilatation or embolism is seen. Pulmonary veins are normal caliber. There is bronchovascular mass encasement and narrowing along the lower hilar area in the lower lobe. Mediastinum/Nodes: There are increasingly prominent lymph nodes in the azygoesophageal recess, largest 1.5 x 2.5 cm on 2:90. There are increasingly prominent right lower hilar lymph nodes difficult to separate out from the right lower lobe mass. Largest of these is  estimated 1.9 cm short axis on 2:91. There is an enlarging right mid hilar node of 1.5 cm on 2: 76, additional subcarinal lymph nodes up to 1 cm. I do not see further adenopathy. Lungs/Pleura: There is moderate fluid consistent with aspiration in the trachea, tracking down into right middle and lower lobe bronchi. Fluid is also seen in the right upper lobe main bronchus and there is diffuse bronchial thickening. There are multiple subsegmental and a few segmental fluid opacified bronchi in the right lower lobe basal segments with the right lower lobe distal main bronchus narrowed by the tumor. There is scattered subsegmental bronchial impactions in the right middle lobe. Right lower lobe mass abutting the hilum is centered in the superior segment and measures 7 x 5.6 cm on 2:10, not larger than on 02/15/2024 but on 02/04/2024 measured 6.5 x 5.5 cm. There is patchy ground-glass airspace disease in the left upper lobe, patchy denser ground-glass intermixed with peribronchovascular nodular airspace disease in the right upper lobe, ill-defined scattered hazy airspace disease in left lower lobe. In the right lower lobe, in addition to a fluid impactions in multiple subsegmental and downstream bronchi, there is patchy bronchopneumonia greatest in the superior segment, with patchy tree-in-bud and ground-glass airspace disease in the right middle lobe. Findings consistent with multilobar pneumonia. No fluid is seen in the left-sided bronchi. There are no pleural effusions. Musculoskeletal: Degenerative change thoracic spine. No regional bone metastasis. At T8-9 and T9-10 there small left paracentral calcified disc extrusions without overt mass effect. Review of the MIP images confirms the above findings. CT ABDOMEN and PELVIS FINDINGS Hepatobiliary: No liver metastasis is seen. Unremarkable gallbladder and bile ducts. Pancreas: No abnormality. Spleen: No abnormality. Adrenals/Urinary Tract: No adrenal renal mass  enhancement. There is a 2 cm right lower pole parapelvic Bosniak 2 cyst with a Hounsfield density of 27, not requiring follow-up imaging. There is a parapelvic cyst in the lower left kidney which is too small to characterize consistent with another Bosniak 2 cyst. There is a 3 mm caliceal stone in the midpole of the left kidney. No other urolithiasis is seen and no obstructing stones. Normal bladder. Stomach/Bowel: There is a PEG tube in place. The balloon is in the stomach. There is moderate  free air in the upper abdomen, more than expected 6 days out from PEG placement. A gastric leak is not excluded but there is no free fluid. Furthermore no focal inflammatory changes seen around the where the tube enters the stomach. Clinical correlation follow-up recommended. No bowel dilatation or wall thickening is seen. The appendix is normal. There is sigmoid diverticulosis, without diverticulitis. Vascular/Lymphatic: Aortic atherosclerosis. No enlarged abdominal or pelvic lymph nodes. Reproductive: The prostate appears to have been removed. No mass is seen in the prostate bed. There are no surgical clips in the pelvic sidewalls. Other: None. Musculoskeletal: There is no focally destructive lesion. The PET-CT demonstrated abnormal activity in the posterior superior facet of the left greater trochanter concerning for metastatic disease, but which does not have a CT correlate. There is degenerative change of the lumbar spine most advanced at the lowest 2 levels where there is acquired spinal stenosis. 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 Review of the MIP images confirms the above findings. IMPRESSION: 1. No evidence of pulmonary arterial dilatation or embolus. 2. 7 x 5.6 cm right lower lobe mass centered in the superior segment, not larger than on 02/15/2024 but slightly increased since 02/04/2024. 3. Aspiration fluid in the trachea, right middle and lower lobe bronchi, some in the right upper lobe main  bronchus, with diffuse bronchial thickening and with multilobar pneumonia described in detail above, right greater than left. 4. Increasingly prominent mediastinal and right hilar lymph nodes. 5. New small anterior pericardial effusion. 6. Aortic and coronary artery atherosclerosis. 7. PEG tube in place with the balloon in the stomach. There is moderate free air in the upper abdomen more than expected 6 days out from PEG placement. A gastric leak is not excluded but there is no free fluid. Furthermore no focal inflammatory changes seen around the where the tube enters the stomach. Clinical correlation and follow-up recommended. 8. No evidence of metastatic disease in the abdomen or pelvis. 9. 3 mm nonobstructing left renal stone. 10. Diverticulosis without evidence of diverticulitis. 11. The PET-CT demonstrated abnormal activity in the posterior superior facet of the left greater trochanter concerning for metastatic disease, but which does not have a CT correlate. 12. Critical Value/emergent results were called by telephone at the time of interpretation on 02/29/2024 at 7:36 am to provider DR. Cleora Daft, who verbally acknowledged these results. Aortic Atherosclerosis (ICD10-I70.0). Electronically Signed   By: Denman Fischer M.D.   On: 02/29/2024 08:11   DG Chest Port 1 View Result Date: 02/29/2024 CLINICAL DATA:  Increased shortness of breath. EXAM: PORTABLE CHEST 1 VIEW COMPARISON:  02/23/2024 FINDINGS: There is a left chest wall porta catheter with tip in the projection of the SVC. Heart size and mediastinal contours are unremarkable. Right lower lobe lung mass is again noted compatible with malignancy. No superimposed pleural effusion, edema or airspace consolidation. Interval decrease in pneumoperitoneum compared with the previous exam. Visualized osseous structures appear intact. IMPRESSION: 1. No acute cardiopulmonary abnormalities. 2. Right lower lobe lung mass compatible with malignancy. 3. Interval decrease  in pneumoperitoneum compared with the previous exam. Electronically Signed   By: Kimberley Penman M.D.   On: 02/29/2024 06:23   DG [facility] MEDICARE SPEECH PATH Result Date: 02/25/2024 CLINICAL DATA:  73 year old male with history of CVA, with esophageal dysphagia. EXAM: MODIFIED BARIUM SWALLOW TECHNIQUE: Different consistencies of barium were administered orally to the patient by the Speech Pathologist. Imaging of the pharynx was performed in the lateral projection. Charles care, PA-C was present  in the fluoroscopy room during this study, which was supervised and interpreted by Dr. Myrlene Asper, MD. FLUOROSCOPY: Radiation Exposure Index (as provided by the fluoroscopic device): 12.30 mGy Kerma COMPARISON:  None Available. FINDINGS: Vestibular  Penetration:  Noted with thin liquids Aspiration:  Noted with thin liquids Other: Patient experienced difficulty swallowing soft foods as well, with ineffective swallow reflex. Early spillage. IMPRESSION: Vestibular penetration with aspiration noted with thin liquids. Poor swallowing reflex noted with soft foods. Please refer to the Speech Pathologists report for complete details and recommendations. Electronically Signed   By: Myrlene Asper D.O.   On: 02/25/2024 15:05   DG Chest Port 1 View Result Date: 02/23/2024 CLINICAL DATA:  Port-A-Cath placement. EXAM: PORTABLE CHEST 1 VIEW COMPARISON:  Feb 16, 2024. FINDINGS: The heart size and mediastinal contours are within normal limits. Left subclavian Port-A-Cath is noted with distal tip in expected position of the SVC. No pneumothorax is noted. Stable large right lower lobe mass is noted concerning for malignancy. Free air is noted under both hemidiaphragms consistent with gastrostomy placement today. The visualized skeletal structures are unremarkable. IMPRESSION: Left subclavian Port-A-Cath is noted with distal tip in expected position of the SVC. Stable right lower lobe mass is noted consistent with malignancy.  Electronically Signed   By: Rosalene Colon M.D.   On: 02/23/2024 12:16   DG C-Arm 1-60 Min-No Report Result Date: 02/23/2024 Fluoroscopy was utilized by the requesting physician.  No radiographic interpretation.   DG Chest Port 1 View Result Date: 02/16/2024 CLINICAL DATA:  Status post bronchoscopy, right lower lobe mass EXAM: PORTABLE CHEST 1 VIEW COMPARISON:  02/01/2024 FINDINGS: 7.2 cm right lower lobe mass is again observed. No pneumothorax or pneumomediastinum. Low lung volumes. Thoracic spondylosis. Heart size within normal limits. IMPRESSION: 1. No pneumothorax or pneumomediastinum. 2. 7.2 cm right lower lobe mass. 3. Low lung volumes. Electronically Signed   By: Freida Jes M.D.   On: 02/16/2024 11:58   DG C-Arm 1-60 Min-No Report Result Date: 02/16/2024 Fluoroscopy was utilized by the requesting physician.  No radiographic interpretation.   MR BRAIN W WO CONTRAST Result Date: 02/09/2024 CLINICAL DATA:  Initial metastatic disease evaluation. EXAM: MRI HEAD WITHOUT AND WITH CONTRAST TECHNIQUE: Multiplanar, multiecho pulse sequences of the brain and surrounding structures were obtained without and with intravenous contrast. CONTRAST:  8mL GADAVIST  GADOBUTROL  1 MMOL/ML IV SOLN COMPARISON:  Comparison made with prior MRIs from 02/01/2024 and 02/05/2024. FINDINGS: Brain: Cerebral volume within normal limits. No significant cerebral white matter disease for age. No evidence for acute or subacute infarct. No areas of chronic cortical infarction. No acute or significant chronic intracranial blood products. Abnormal nodular thickening and enhancement seen about multiple cranial nerves at the skull base. Changes are most pronounced about the nerve root entry zones of the trigeminal nerves bilaterally (series 18, image 59). Prominent involvement of the seventh and 8 nerve cranial nerve complexes as well (series 18, image 48). Involvement of the hypoglossal nerves. Subtle nodular left a meningeal  enhancement seen about the cerebellum, most pronounced at the vermis (series 18, images 75, 68, 61). Thickening and enhancement about the pituitary stalk (series 18, image 73). 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 Mild associated edema within the brain parenchyma itself of the pons left middle cerebellar peduncle (series 11, image 18). No other significant mass effect or associated edema. No other mass lesion or abnormal enhancement. Ventricles normal size without hydrocephalus. No extra-axial fluid collections. No other abnormal enhancement. Vascular: Major intracranial vascular flow voids  are maintained. Skull and upper cervical spine: Craniocervical junction within normal limits. Bone marrow signal intensity within normal limits. No focal marrow replacing lesion. No scalp soft tissue abnormality. Sinuses/Orbits: Prior ocular lens replacement on the left. Questionable focus of asymmetric enhancement at the posterior aspect of the left lobe near the optic nerve insertion (series 18, image 63). Orbital soft tissues otherwise unremarkable. Paranasal sinuses are largely clear. No significant mastoid effusion. Other: None. IMPRESSION: 1. Abnormal nodular thickening and enhancement about multiple cranial nerves at the skull base and pituitary stalk, with additional subtle leptomeningeal enhancement about the cerebellum. 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 2. Mild associated edema within the brain parenchyma of the pons/left middle cerebellar peduncle. 3. Questionable focus of asymmetric enhancement at the posterior aspect of the left globe near the optic nerve insertion. Correlation with fundoscopic examination suggested. Additionally, attention at follow-up recommended. 4. No other acute intracranial abnormality. Electronically Signed   By: Virgia Griffins M.D.   On: 02/09/2024 20:52   MR THORACIC SPINE  W WO CONTRAST Result Date: 02/09/2024 CLINICAL DATA:  Initial metastatic disease evaluation. EXAM: MRI THORACIC WITHOUT AND WITH CONTRAST TECHNIQUE: Multiplanar and multiecho pulse sequences of the thoracic spine were obtained without and with intravenous contrast. CONTRAST:  8mL GADAVIST  GADOBUTROL  1 MMOL/ML IV SOLN COMPARISON:  Prior MRI from 02/05/2024. FINDINGS: Alignment: Trace scoliosis. Alignment otherwise normal with preservation of the normal thoracic kyphosis. No listhesis. Vertebrae: Vertebral body height maintained without acute or chronic fracture. Bone marrow signal intensity within normal limits. Benign hemangioma noted within the T3 vertebral body. No evidence for osseous metastatic disease. Degenerative reactive endplate change with marrow edema present about the T12-L1 interspace. No other abnormal marrow edema or enhancement. Cord: Few small nodular foci of enhancement seen about the visualized distal conus/cauda equina at the level of L1, concerning for leptomeningeal disease as seen on prior MRI of the lumbar spine (series 24, images 8, 9, 10). No other evidence for intracanalicular metastatic disease within the thoracic spine. Cord itself demonstrates normal signal and morphology. Paraspinal and other soft tissues: Paraspinous soft tissues within normal limits. Patient's known right lung mass partially visualized. Disc levels: T1-2: Negative interspace.  Mild facet hypertrophy.  No stenosis. T2-3: Minimal disc bulge. Mild facet hypertrophy. No spinal stenosis. Mild bilateral foraminal narrowing. T3-4:  Negative interspace.  Mild facet hypertrophy.  No stenosis. T4-5: Negative interspace. Mild facet hypertrophy. No spinal stenosis. Mild right foraminal narrowing. T5-6: Negative interspace. Mild facet hypertrophy. No significant stenosis. T6-7: Left paracentral disc protrusion indents the left ventral thecal sac. Mild facet hypertrophy. No stenosis. T7-8: Left paracentral disc extrusion with  inferior migration indents the ventral thecal sac. Flattening of the left ventral cord without cord signal changes or significant spinal stenosis. Foramina remain patent. T8-9: Left paracentral disc extrusion with possible superior migration. Flattening of the left ventral cord without cord signal changes or significant spinal stenosis. Foramina remain patent. T9-10: Left paracentral disc extrusion with superior migration. Flattening of the left ventral cord without cord signal changes or significant spinal stenosis. Mild bilateral facet hypertrophy with resultant mild right foraminal stenosis. Left neural foramina remains adequately patent. T10-11: Minimal disc bulge. Mild facet hypertrophy. No spinal stenosis. Mild right with moderate left foraminal stenosis. T11-12: Diffuse disc bulge with endplate spurring. Mild facet hypertrophy. No significant stenosis. T12-L1: Degenerative disc space narrowing diffuse disc bulge and disc desiccation. Reactive endplate spurring. Moderate facet hypertrophy. Resultant mild-to-moderate spinal stenosis with moderate bilateral foraminal narrowing. IMPRESSION:  1. Few small nodular foci of enhancement about the visualized distal conus/cauda equina at the level of L1, concerning for leptomeningeal disease as seen on prior MRI of the lumbar spine. No other evidence for intracanalicular or osseous metastatic disease within the thoracic spine. 2. Solid right lung mass, partially visualized, and better characterized on recent chest CT. 3. 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 4. Multifactorial degenerative changes with resultant multilevel foraminal narrowing as above. Notable findings include mild right foraminal stenosis at T4-5 and T9-10, with moderate left foraminal narrowing at T10-11. Electronically Signed   By: Virgia Griffins M.D.   On: 02/09/2024 18:31   MR CERVICAL SPINE W WO CONTRAST Result Date: 02/09/2024 CLINICAL  DATA:  Initial evaluation for metastatic disease evaluation. EXAM: MRI CERVICAL SPINE WITHOUT AND WITH CONTRAST TECHNIQUE: Multiplanar and multiecho pulse sequences of the cervical spine, to include the craniocervical junction and cervicothoracic junction, were obtained without and with intravenous contrast. CONTRAST:  8mL GADAVIST  GADOBUTROL  1 MMOL/ML IV SOLN COMPARISON:  Prior MRI from 02/05/2024. FINDINGS: Alignment: Mild dextroscoliosis with straightening of the normal cervical lordosis. Trace degenerative anterolisthesis of C2 on C3 and C3 on C4. Vertebrae: Vertebral body height maintained without acute or chronic fracture. Bone marrow signal intensity within normal limits. Small benign hemangioma noted within the T3 vertebral body. No evidence for osseous metastatic disease. No abnormal marrow edema or enhancement. Cord: Normal signal and morphology. No evidence for intracanalicular metastatic disease within the cervical spine. Posterior Fossa, vertebral arteries, paraspinal tissues: Abnormal thickening and enhancement seen about multiple cranial nerves at the visualized skull base (series 12, images 6, 15). 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 Degenerative thickening noted about the tectorial membrane without significant stenosis at the craniocervical junction. Paraspinous soft tissues within normal limits. Normal flow voids seen within the vertebral arteries bilaterally. Disc levels: C2-C3: Trace anterolisthesis. Mild uncovertebral spurring without significant disc bulge. Moderate left with mild right facet hypertrophy. No significant spinal stenosis. Mild left C3 foraminal narrowing. Right neural foramina remains patent. C3-C4: Trace anterolisthesis. Small left paracentral disc protrusion mildly indents the ventral thecal sac. Left greater than right uncovertebral and facet hypertrophy. Mild spinal stenosis. Severe left with mild right C4  foraminal narrowing. C4-C5: Disc bulge with moderate right with mild left C5 foraminal stenosis. C5-C6: Degenerative disc space narrowing with diffuse disc osteophyte complex. Flattening and partial effacement of the ventral thecal sac. Superimposed facet hypertrophy. Moderate spinal stenosis with severe right worse than left C6 foraminal narrowing. C6-C7: Degenerative disc space narrowing with diffuse disc osteophyte complex. Flattening and partial effacement of the ventral thecal sac. Superimposed mild facet hypertrophy. Moderate spinal stenosis with severe bilateral C7 foraminal narrowing. C7-T1: Tiny left paracentral disc protrusion. Mild facet hypertrophy. No spinal stenosis. Foramina remain adequately patent. IMPRESSION: 1. 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 2. No other evidence for osseous or intracanalicular metastatic disease within the cervical spine. 3. 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 4. Multifactorial degenerative changes with resultant multilevel foraminal narrowing as above. Notable findings include severe left C4 foraminal stenosis, moderate right C5 foraminal narrowing, severe bilateral C6 and C7 foraminal stenosis, with mild left C3 foraminal narrowing. Electronically Signed   By: Virgia Griffins M.D.   On: 02/09/2024 18:19   MR Lumbar Spine W Wo Contrast Result Date: 02/05/2024 CLINICAL DATA:  Lung mass. Prostate cancer. Melanoma. Abnormal PET scan. EXAM: MRI LUMBAR SPINE WITHOUT AND WITH CONTRAST TECHNIQUE: Multiplanar and multiecho pulse sequences of the lumbar spine were obtained without and with intravenous contrast. CONTRAST:  7.5mL GADAVIST  GADOBUTROL  1 MMOL/ML IV SOLN COMPARISON:  Whole body PET scan 02/04/2024 FINDINGS: Segmentation: 5 non rib-bearing  lumbar type vertebral bodies are present. The lowest fully formed vertebral body is L5. Alignment: Slight retrolisthesis present at L1-2 and L2-3. Grade 1 anterolisthesis is present at L5-S1. Straightening of the normal lumbar lordosis is present. Levoconvex curvature is centered at L4-5. Vertebrae: Mixed type 1 and type 2 Modic changes are present at L3-4. Edematous changes are present on the right. More chronic type 2 Modic changes are present posteriorly on the right at L5-S1 and posteriorly on the right at L2-3. Edematous endplate changes are present posteriorly at L1-2. Vertebral body heights are maintained. No osseous metastases are present. Conus medullaris and cauda equina: Conus extends to the L2 level. Diffuse dural enhancement is present. Nodular enhancement is present within the conus medullaris. A dorsal and right lateral nodule is present at L2 adjacent to the conus. Ventral nodule and posterior dural enhancement is present at the L2-3 level. A dorsal or dural nodule is present at L4. A 7 mm enhancing nodule is present laterally at the L4-5 disc level, corresponding to the traversing left L5 nerve roots. Paraspinal and other soft tissues: An 18 mm simple cyst is noted at the lower pole of the right kidney. No other solid organ lesions are present. No significant adenopathy is present. Disc levels: T12-L1: Mild disc bulging is present without focal stenosis. L1-2: A right paramedian disc protrusion is present. Mild central canal stenosis is present. Disc extends into the foramina bilaterally. Moderate bilateral foraminal stenosis is present. L2-3: A leftward disc protrusion is present. Moderate facet hypertrophy is seen bilaterally. This results an central canal stenosis with moderate left and mild right subarticular narrowing. Moderate foraminal narrowing is worse on the left. L3-4: A broad-based disc protrusion is present. Moderate facet hypertrophy is noted bilaterally. This results in moderate to  severe central canal stenosis. Severe right and moderate left foraminal stenosis is present. L4-5: A broad-based disc protrusion is present. Moderate facet hypertrophy is noted bilaterally. Moderate foraminal narrowing is present bilaterally. L5-S1: Chronic loss of disc height is present. Moderate facet hypertrophy is worse on the right. Mild right subarticular narrowing is present. Moderate foraminal stenosis is worse right than left. IMPRESSION: 1. Diffuse dural enhancement with nodular enhancement within the conus medullaris. 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 2. 7 mm enhancing nodule laterally at the L4-5 disc level, corresponding to the traversing left L5 nerve roots. 3. No osseous metastases. 4. Multilevel spondylosis of the lumbar spine as described. 5. Mild central canal stenosis at L1-2. 6. Moderate bilateral foraminal stenosis at L1-2. 7. Moderate left and mild right subarticular narrowing at L2-3. 8. Moderate to severe central canal stenosis at L3-4 with severe right and moderate left foraminal stenosis. 9. Moderate foraminal narrowing bilaterally at L4-5 and L5-S1. These results will be called to the ordering clinician or representative by the Radiologist Assistant, and communication documented in the PACS or [employer]. Electronically Signed   By: Audree Leas M.D.   On: 02/05/2024 19:41   NM PET Image Initial (PI) Skull Base To Thigh (F-18 FDG) Result Date: 02/04/2024 CLINICAL DATA:  Initial treatment strategy for lung nodule. Additional history of prostate cancer and melanoma. EXAM: NUCLEAR MEDICINE PET SKULL  BASE TO THIGH TECHNIQUE: 10.31 mCi F-18 FDG was injected intravenously. Full-ring PET imaging was performed from the skull base to thigh after the radiotracer. CT data was obtained and used for attenuation correction and anatomic localization. Fasting blood glucose: 148 mg/dl COMPARISON:  PET-CT report November 2015.  Chest CT 02/01/2024 FINDINGS:  Mediastinal blood pool activity: SUV max 3.1 Liver activity: SUV max 3.4 NECK: No specific abnormal radiotracer uptake seen including along lymph node chains of the submandibular, posterior triangle or internal jugular region. Near symmetric uptake of the visualized intracranial compartment. Incidental CT findings: Visualized paranasal sinuses and mastoid air cells are clear. Streak artifact related to patient's dental hardware. The parotid glands, submandibular glands and thyroid  glands unremarkable. CHEST: Large heterogeneous right lower lobe lung mass again identified corresponding to the 6.7 cm lesion seen on the recent CT scan of the chest with maximum SUV value of 21.1 worrisome for neoplasm. No additional areas of abnormal uptake along the lung parenchyma. There is some mild uptake along the right infrahilar region which may be small nodes. The right lower lobe mass as abut the hilum these nodes have maximum SUV value of 8.2. No additional areas of abnormal uptake elsewhere in the chest including mediastinum, left hilum or axillary regions. The subcarinal nodes described on the CT scan have lower level of uptake with maximum SUV of 3.4. Incidental CT findings: Scattered vascular calcifications are seen including along the coronary arteries. No pericardial effusion. The thoracic aorta is normal course and caliber. Breathing motion. No pleural effusion or pneumothorax. Calcified tiny nodules in the left lung are again noted as on prior. ABDOMEN/PELVIS: There is physiologic distribution radiotracer along the parenchymal organs, bowel and renal collecting systems. Incidental CT findings: Nonobstructing midportion left-sided renal stone. Right-sided central renal cyst. Contracted urinary bladder. Large bowel has a normal course and caliber with scattered stool. Left-sided colonic diverticula. Normal appendix. Gallbladder is nondilated with dependent stones. Nonspecific perinephric stranding, right greater than  left. SKELETON: Focal area of abnormal uptake seen along the left hip greater trochanter with faint area of sclerosis. Maximum SUV value of 8.4 worrisome for a skeletal metastasis. Is also focus of uptake with maximum SUV value of 8.4 corresponding to the central canal of the spine at the L3-4 level. Small soft tissue nodule in this location on CT image 104. This has a differential. Incidental CT findings: Scattered degenerative changes along the spine. Multilevel degenerative changes particularly with stenosis in the lumbar region. Trace listhesis. Degenerative changes as well of the shoulders and pelvis. IMPRESSION: Large hypermetabolic right lower lobe lung mass as seen on prior CT exam scanned extending to the hilum consistent with neoplasm. Uptake in the right hilum could represent some adjacent nodes. Hypermetabolic sclerotic lesion involving the left hip greater trochanter worrisome for a skeletal metastasis. Focus area of uptake along the central canal of the spine at L3-4 left of midline is also seen. This would have a differential. An additional metastatic lesion is 1 of those differential. Recommend further workup with lumbar spine MRI with and without contrast to further delineate. Electronically Signed   By: Adrianna Horde M.D.   On: 02/04/2024 11:56   CT Chest W Contrast Result Date: 02/01/2024 CLINICAL DATA:  Respiratory illness, nondiagnostic xray Ongoing cough post COVID infection with difficulty swelling for 1 month. Right lung mass on radiographs concerning for malignancy. History of melanoma. * Tracking Code: BO * EXAM: CT CHEST WITH CONTRAST TECHNIQUE: Multidetector CT imaging of the chest was performed during intravenous  contrast administration. RADIATION DOSE REDUCTION: This exam was performed according to the departmental dose-optimization program which includes automated exposure control, adjustment of the mA and/or kV according to patient size and/or use of iterative reconstruction  technique. CONTRAST:  75mL OMNIPAQUE  IOHEXOL  300 MG/ML  SOLN COMPARISON:  Chest radiographs same date. No other recent imaging. Remote PET-CT 09/05/2014. FINDINGS: Cardiovascular: No acute vascular findings. There is atherosclerosis of the aorta, great vessels and coronary arteries. There are calcifications of the aortic valve. The heart size is normal. There is no pericardial effusion. Mediastinum/Nodes: There are newly enlarged right hilar and subcarinal lymph nodes, including a 1.4 cm short axis subcarinal node on image 73/2. Right hilar lymph nodes measuring up to 1.7 x 1.0 cm on image 66/2 are partially calcified. Stable postsurgical changes in the right axilla from previous axillary node dissection. No enlarged axillary lymph nodes are identified. The thyroid  gland, trachea and esophagus demonstrate no significant findings. Lungs/Pleura: No pleural effusion or pneumothorax. As demonstrated on earlier chest radiographs, there is a large well-circumscribed solid appearing mass in the right lower lobe which measures approximately 6.7 x 6.0 cm on image 82/4. Mild surrounding posterior obstructive pneumonitis and central airway thickening. No other suspicious pulmonary nodules are identified. There are small calcified granulomas adjacent to the left major fissure. Upper abdomen: Diffuse hepatic steatosis. No focal abnormalities identified. There is no evidence of adrenal mass. Musculoskeletal/Chest wall: There is no chest wall mass or suspicious osseous finding. Mild spondylosis. IMPRESSION: 1. Large well-circumscribed solid appearing mass in the right lower lobe with mild surrounding posterior obstructive pneumonitis and central airway thickening. Findings are not typical for pneumonia and are concerning for malignancy. Differential includes primary bronchogenic carcinoma and metastatic melanoma given the patient's history. 2. Newly enlarged right hilar and subcarinal lymph nodes, suspicious for metastatic  disease. PET-CT may be helpful for further evaluation. 3. No other evidence of metastatic disease. 4. Hepatic steatosis. 5. Aortic atherosclerosis. Electronically Signed   By: Elmon Hagedorn M.D.   On: 02/01/2024 18:41    PERFORMANCE STATUS (ECOG) : 4 - Bedbound  Review of Systems Unable to provide  Physical Exam General: Ill-appearing Pulmonary: Unlabored Extremities: no edema, no joint deformities Skin: no rashes Neurological: Poorly responsive  IMPRESSION:  Patient made comfort care yesterday after clinical decline.  Remains poorly responsive and ill-appearing today.  Spoke with family. They report that patient has had pain. Will have RN give dose of morphine .   Emotional support provided.   I would anticipate rapid decline and in hospital death.   PLAN: -Comfort care - DNR/DNI  Time Total: 15 minutes  Visit consisted of counseling and education dealing with the complex and emotionally intense issues of symptom management and palliative care in the setting of serious and potentially life-threatening illness.Greater than 50%  of this time was spent counseling and coordinating care related to the above assessment and plan.  Signed by: Gerilyn Kobus, PhD, NP-C

## 2024-03-02 NOTE — Progress Notes (Signed)
 OT Cancellation Note  Patient Details Name: NORWOOD QUEZADA MRN: [id number] DOB: 1950-11-13   Cancelled Treatment:    Reason Eval/Treat Not Completed: Other (comment) (pt has transitioned to comfort care, per discussion with MD OT will sign off at this time.)  Gerre Kraft, OTD OTR/L  03/02/24, 10:08 AM

## 2024-03-02 NOTE — Progress Notes (Signed)
 Order cancelled per Dr. Randy Buttery - pt currently under hospice/comfort measures.

## 2024-03-02 NOTE — Progress Notes (Signed)
 Nutrition Brief Note  Chart reviewed. Pt now transitioning to comfort care.  No further nutrition interventions planned at this time.  Please re-consult as needed.   Levada Schilling, RD, LDN, CDCES Registered Dietitian III Certified Diabetes Care and Education Specialist If unable to reach this RD, please use "RD Inpatient" group chat on secure chat between hours of 8am-4 pm daily

## 2024-03-02 NOTE — Plan of Care (Signed)
 Education and reviewing of care plan done with pt's family.   Problem: Education: Goal: Knowledge of General Education information will improve Description: Including pain rating scale, medication(s)/side effects and non-pharmacologic comfort measures Outcome: Progressing   Problem: Health Behavior/Discharge Planning: Goal: Ability to manage health-related needs will improve Outcome: Progressing   Problem: Clinical Measurements: Goal: Ability to maintain clinical measurements within normal limits will improve Outcome: Progressing Goal: Will remain free from infection Outcome: Progressing Goal: Diagnostic test results will improve Outcome: Progressing Goal: Respiratory complications will improve Outcome: Progressing Goal: Cardiovascular complication will be avoided Outcome: Progressing   Problem: Activity: Goal: Risk for activity intolerance will decrease Outcome: Progressing   Problem: Nutrition: Goal: Adequate nutrition will be maintained Outcome: Progressing   Problem: Coping: Goal: Level of anxiety will decrease Outcome: Progressing   Problem: Elimination: Goal: Will not experience complications related to bowel motility Outcome: Progressing Goal: Will not experience complications related to urinary retention Outcome: Progressing   Problem: Pain Managment: Goal: General experience of comfort will improve and/or be controlled Outcome: Progressing   Problem: Safety: Goal: Ability to remain free from injury will improve Outcome: Progressing   Problem: Skin Integrity: Goal: Risk for impaired skin integrity will decrease Outcome: Progressing   Problem: Education: Goal: Ability to describe self-care measures that may prevent or decrease complications (Diabetes Survival Skills Education) will improve Outcome: Progressing Goal: Individualized Educational Video(s) Outcome: Progressing   Problem: Coping: Goal: Ability to adjust to condition or change in health  will improve Outcome: Progressing   Problem: Fluid Volume: Goal: Ability to maintain a balanced intake and output will improve Outcome: Progressing   Problem: Health Behavior/Discharge Planning: Goal: Ability to identify and utilize available resources and services will improve Outcome: Progressing Goal: Ability to manage health-related needs will improve Outcome: Progressing   Problem: Metabolic: Goal: Ability to maintain appropriate glucose levels will improve Outcome: Progressing   Problem: Nutritional: Goal: Maintenance of adequate nutrition will improve Outcome: Progressing Goal: Progress toward achieving an optimal weight will improve Outcome: Progressing   Problem: Skin Integrity: Goal: Risk for impaired skin integrity will decrease Outcome: Progressing   Problem: Tissue Perfusion: Goal: Adequacy of tissue perfusion will improve Outcome: Progressing

## 2024-03-02 NOTE — Progress Notes (Signed)
 Spoke with the patient's wife today at bedside.  The patient and family have decided to transition to comfort measures.  His antibiotics have been discontinued, and tube feeds have been discontinued.  Given that his gastrostomy tube was just recently placed on 02/23/24, discussed with his wife that we would have to keep it in place, but it can certainly remain capped, or it can also be used to give liquid pain medication given the patient's swallowing difficulties.    Surgical team will sign off, but we're available if any questions or concerns.   Emmalene Hare, MD

## 2024-03-02 NOTE — Progress Notes (Signed)
 PROGRESS NOTE Mason Stewart  WUJ:[id number] DOB: 18-Jun-1951 DOA: 02/29/2024 PCP: Helaine Llanos, MD   Brief Narrative:  73 y.o. 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 patient has very poor tolerance to the tube feeding, and has had frequent feeling of nauseous vomiting. Family in consultation with each other, patient when coherent, and discussions with palliative elected to transition to comfort care 5/20. Unlikely to be able to be transported in current state and would expect hospital passing.   Assessment & Plan:   Principal Problem:   Aspiration pneumonia (HCC) Active Problems:   Dysphagia   Sepsis (HCC)   Acute respiratory failure with hypoxia (HCC)   SOB (shortness of breath)   Palliative care encounter   Protein-calorie malnutrition, severe  GOC, comfort care- poor prognosis.  - palliative following - supportive care as ordered and described below  Acute hypoxic respiratory failure RML and RLL aspiration pneumonia, bacterial Patient has a PEG tube in place but appears his risk of aspiration remains quite high.  There is evidence of aspiration of the right middle and right lower lobes.  Etiology of aspiration likely secondary to pharyngeal dysfunction in the setting of metastatic brain lesion. - morhpine PRN for air hunger - ativan PRN for agitation, anxiety - robinol, suction for secretions - avoid tube feeds  Free air in the peritoneal cavity- No significant leakage around the PEG tube insertion site.  Case discussed with general surgery Dr. Mauri Sous.  No urgent surgical intervention warranted.  - analgesia PRN   Severe sepsis, with  acute endorgan damage - Sepsis evidenced by hypoxia leukocytosis, source of infection is right-sided aspiration pneumonia and management as above.  Blood pressure was low and requiring IV boluses in the ED.  Acute endorgan damage including metabolic encephalopathy.  Antibiotics are discontinued at this time.    Metastatic melanoma, recurrent- - Imaging studies so far has showed multiple metastatic sites including brain right side of the lungs, left hip, L3-4.  Oncology palliative care following   DVT prophylaxis: none Code Status: comfort, DNR Family Communication: Wife at bedside Disposition Plan: Status is: Inpatient Remains inpatient appropriate because: not stable for transportation   Level of care: Telemetry Medical  Consultants:  Palliative care  Procedures:  None  Antimicrobials: Zosyn   Subjective: Pt denies being in pain. Unable to converse but responds with head nod intermittently to his wife's questions.   Objective: Vitals:   03/01/24 1000 03/01/24 1312 03/01/24 1332 03/02/24 0516  BP: (!) 151/78  (!) 172/90   Pulse:  97 92   Resp: 13 17 18    Temp:  98 F (36.7 C) 98.9 F (37.2 C)   TempSrc:  Axillary    SpO2:  96% 98%   Weight:    74.5 kg  Height:        Intake/Output Summary (Last 24 hours) at 03/02/2024 0724 Last data filed at 03/02/2024 0430 Gross per 24 hour  Intake 1056.67 ml  Output 700 ml  Net 356.67 ml   Filed Weights   02/29/24 0553 03/02/24 0516  Weight: 77.3 kg 74.5 kg    Examination:  General exam: Ill-appearing Respiratory system: Coarse stertor.  Cardiovascular system: warm extremities  Gastrointestinal system: Soft, nontender, PEG without leakage Central nervous  system: Alert to voice Skin: No rashes, lesions or ulcers  Data Reviewed: I have personally reviewed following labs and imaging studies  CBC: Recent Labs  Lab 02/26/24 1305 02/29/24 0557 03/01/24 0432  WBC 13.1* 34.3* 18.3*  NEUTROABS 10.8* 31.0*  --   HGB  12.5* 13.9 11.6*  HCT 34.4* 38.2* 32.0*  MCV 84.9 85.1 85.3  PLT 296 372 252   Basic Metabolic Panel: Recent Labs  Lab 02/26/24 1255 02/26/24 1304 02/29/24 0557 02/29/24 0756 03/01/24 0432  NA  --  131* 134*  --  135  K  --  3.4* 3.2*  --  2.8*  CL  --  95* 95*  --  102  CO2  --  27 27  --  23  GLUCOSE  --  148* 195*  --  143*  BUN  --  14 17  --  12  CREATININE  --  0.70 0.69  --  0.54*  CALCIUM   --  8.8* 9.2  --  7.8*  MG 2.1  --   --  1.7  --   PHOS 3.0  --   --  2.9  --     LOS: 2 days    Ree Candy, MD [facility]   If 7PM-7AM, please contact night-coverage  03/02/2024, 7:24 AM

## 2024-03-03 DIAGNOSIS — J690 Pneumonitis due to inhalation of food and vomit: Secondary | ICD-10-CM | POA: Diagnosis not present

## 2024-03-03 DIAGNOSIS — R0602 Shortness of breath: Secondary | ICD-10-CM | POA: Diagnosis not present

## 2024-03-03 DIAGNOSIS — Z515 Encounter for palliative care: Secondary | ICD-10-CM | POA: Diagnosis not present

## 2024-03-03 LAB — GLUCOSE, CAPILLARY: Glucose-Capillary: 159 mg/dL — ABNORMAL HIGH (ref 70–99)

## 2024-03-04 ENCOUNTER — Encounter: Payer: Self-pay | Admitting: *Deleted

## 2024-03-05 LAB — CULTURE, BLOOD (ROUTINE X 2)
Culture: NO GROWTH
Culture: NO GROWTH

## 2024-03-08 ENCOUNTER — Ambulatory Visit

## 2024-03-09 ENCOUNTER — Inpatient Hospital Stay: Admitting: Occupational Therapy

## 2024-03-09 ENCOUNTER — Encounter: Admitting: Surgery

## 2024-03-13 NOTE — Progress Notes (Signed)
 Palliative Medicine [facility] at [facility] [phone] [phone]   Name: Mason Stewart Date: 02/16/2024 MRN: [id number]  DOB: 05/17/1951  Patient Care Team: Helaine Llanos, MD as PCP - General (Internal Medicine) Arlen Lacks, Harrell Lima, MD as Referring Physician (Ophthalmology) Gaynelle Keeling, MD as Consulting Physician (Dermatology) Drake Gens, RN as Oncology Nurse Navigator    REASON FOR CONSULTATION: Mason Stewart is a 73 y.o. male with multiple medical problems including history of melanoma and prostate cancer, now with stage IV melanoma with leptomeningeal spread.  Patient was started on immunotherapy.  Underwent PEG placement due to severe dysphagia and poor oral intake.  Patient now admitted to the hospital with aspiration pneumonia.  Palliative care consulted to address goals.   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 Cornett, MD;  Location: [facility];  Service: General;  Laterality: Right;   BACK SURGERY  10/14/1995   lower back   BRONCHOSCOPY, WITH BIOPSY USING ELECTROMAGNETIC NAVIGATION Bilateral 02/16/2024   Procedure: ROBOTIC ASSISTED NAVIGATIONAL BRONCHOSCOPY;  Surgeon:  Vergia Glasgow, MD;  Location: [facility];  Service: Pulmonary;  Laterality: Bilateral;   CARPAL TUNNEL RELEASE Right 03/14/2023   colonscopy  10/13/2010   CREATION, GASTROSTOMY, OPEN N/A 02/23/2024   Procedure: CREATION, GASTROSTOMY, OPEN;  Surgeon: Emmalene Hare, MD;  Location: [facility];  Service: General;  Laterality: N/A;   EXCISION MELANOMA WITH SENTINEL LYMPH NODE BIOPSY Right 11/23/2013   Procedure: EXCISION MELANOMA WITH SENTINEL LYMPH NODE BIOPSY;  Surgeon: Brandy Cal. Cornett, MD;  Location: [facility];  Service: General;  Laterality: Right;   EYE SURGERY     cataract left, reduced pressure in left   left middle finger surgery after injury  yrs ago   PORTACATH PLACEMENT N/A 02/23/2024   Procedure: INSERTION, TUNNELED CENTRAL VENOUS DEVICE, WITH PORT;  Surgeon: Emmalene Hare, MD;  Location: [facility];  Service: General;  Laterality: N/A;   ROBOT ASSISTED LAPAROSCOPIC RADICAL PROSTATECTOMY  06/07/2012   Procedure: ROBOTIC ASSISTED LAPAROSCOPIC RADICAL PROSTATECTOMY LEVEL 2;  Surgeon: Kristeen Peto, MD;  Location: [facility];  Service: Urology;  Laterality: N/A;        HEMATOLOGY/ONCOLOGY HISTORY:  Oncology History Overview Note  Melanoma of thoracic region   Primary site: Melanoma of the Skin (Right)   Staging method: AJCC 7th Edition   Clinical: Stage III (T2, N1, M0) signed by Almeda Jacobs, MD on 12/23/2013  9:55 AM   Pathologic: (T2, N1, cM0) signed by Almeda Jacobs, MD on 12/23/2013  9:56 AM   Summary: Stage III (T2, N1, cM0)  Also history of prostate cancer T2cN0M0 status post prostatectomy.   Melanoma of thoracic region [facility]) (  Resolved)  06/07/2012 Surgery   The patient underwent prostate surgery which show Gleason 3+4 involving both lobes.   10/20/2013 Procedure   The patient underwent biopsy to confirm superficial spreading melanoma, 1.82 mm thickness   11/23/2013 Surgery   The patient underwent wide local excision and sentinel lymph node biopsy which showed 1 lymph node  involvement.   12/13/2013 Surgery   The patient underwent in complete lymph node dissection did show no evidence of disease.   02/20/2014 Imaging   Staging PET CT scan show no evidence of disease recurrence.   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  MEDICATIONS:  Current Facility-Administered Medications  Medication Dose Route Frequency Provider Last Rate Last Admin   acetaminophen  (TYLENOL ) 160 MG/5ML solution 960 mg  960 mg Per Tube Q6H PRN [place], MD       Chlorhexidine  Gluconate Cloth 2 % PADS 6 each  6 each Topical Daily Sreenath, Sudheer B, MD       dorzolamide -timolol  (COSOPT ) 2-0.5 % ophthalmic solution 1 drop  1 drop Both Eyes BID Antoniette Batty T, MD   1 drop at 03/01/24 1007   glycopyrrolate  (ROBINUL ) injection 0.2 mg  0.2 mg Intravenous Q4H Evette Hoes L, MD   0.2 mg at 03/06/2024 1610   haloperidol  lactate (HALDOL ) injection 2 mg  2 mg Intravenous Q6H PRN Antoniette Batty T, MD       latanoprost  (XALATAN ) 0.005 % ophthalmic solution 1 drop  1 drop Both Eyes BID Antoniette Batty T, MD   1 drop at 03/01/24 1007   LORazepam (ATIVAN) injection 1-2 mg  1-2 mg Intravenous Q4H PRN Collyns Mcquigg R, NP   1 mg at 03/05/2024 0246   morphine  (PF) 2 MG/ML injection 2 mg  2 mg Intravenous Q1H PRN Aliany Fiorenza R, NP   2 mg at 03/11/2024 9604   sodium chloride  flush (NS) 0.9 % injection 10-40 mL  10-40 mL Intracatheter PRN Tiajuana Fluke, MD        VITAL SIGNS: BP 126/73 (BP Location: Right Arm)   Pulse (!) 123   Temp 98.2 F (36.8  C)   Resp (!) 21   Ht 5\' 6"  (1.676 m)   Wt 164 lb 10.9 oz (74.7 kg)   SpO2 (!) 69%   BMI 26.58 kg/m  Filed Weights   02/29/24 0553 03/02/24 0516 03/04/2024 0500  Weight: 170 lb 8.4 oz (77.3 kg) 164 lb 3.9 oz (74.5 kg) 164 lb 10.9 oz (74.7 kg)    Estimated body mass index is 26.58 kg/m as calculated from the following:   Height as of this encounter: 5\' 6"  (1.676 m).   Weight as of this encounter: 164 lb 10.9 oz (74.7 kg).  LABS: CBC:    Component Value Date/Time   WBC 18.3 (H) 03/01/2024 0432   HGB 11.6 (L) 03/01/2024 0432   HGB 12.5 (L) 02/26/2024 1305   HGB 15.5 09/26/2014 0832   HCT 32.0 (L) 03/01/2024 0432   HCT 43.2 09/26/2014 5409  PLT 252 03/01/2024 0432   PLT 296 02/26/2024 1305   PLT 223 09/26/2014 0832   MCV 85.3 03/01/2024 0432   MCV 86.6 09/26/2014 0832   NEUTROABS 31.0 (H) 02/29/2024 0557   NEUTROABS 5.4 09/26/2014 0832   LYMPHSABS 0.8 02/29/2024 0557   LYMPHSABS 1.0 09/26/2014 0832   MONOABS 1.9 (H) 02/29/2024 0557   MONOABS 0.5 09/26/2014 0832   EOSABS 0.2 02/29/2024 0557   EOSABS 0.2 09/26/2014 0832   BASOSABS 0.1 02/29/2024 0557   BASOSABS 0.0 09/26/2014 0832   Comprehensive Metabolic Panel:    Component Value Date/Time   NA 135 03/01/2024 0432   NA 138 02/03/2020 0000   NA 139 09/26/2014 0832   K 2.8 (L) 03/01/2024 0432   K 3.9 09/26/2014 0832   CL 102 03/01/2024 0432   CO2 23 03/01/2024 0432   CO2 22 09/26/2014 0832   BUN 12 03/01/2024 0432   BUN 12 02/03/2020 0000   BUN 13.3 09/26/2014 0832   CREATININE 0.54 (L) 03/01/2024 0432   CREATININE 0.70 02/26/2024 1304   CREATININE 1.0 09/26/2014 0832   GLUCOSE 143 (H) 03/01/2024 0432   GLUCOSE 197 (H) 09/26/2014 0832   CALCIUM  7.8 (L) 03/01/2024 0432   CALCIUM  9.0 09/26/2014 0832   AST 18 02/29/2024 0557   AST 19 02/26/2024 1304   AST 19 09/26/2014 0832   ALT 16 02/29/2024 0557   ALT 14 02/26/2024 1304   ALT 28 09/26/2014 0832   ALKPHOS 76 02/29/2024 0557   ALKPHOS 69 09/26/2014 0832    BILITOT 2.1 (H) 02/29/2024 0557   BILITOT 1.1 02/26/2024 1304   BILITOT 0.92 09/26/2014 0832   PROT 7.4 02/29/2024 0557   PROT 6.5 09/26/2014 0832   ALBUMIN 3.7 02/29/2024 0557   ALBUMIN 3.8 09/26/2014 0832    RADIOGRAPHIC STUDIES: CT SUPER D CHEST WO CONTRAST Result Date: 02/29/2024 CLINICAL DATA:  Right lower lobe lung mass.  Pre bronchoscopy. EXAM: CT CHEST WITHOUT CONTRAST TECHNIQUE: Multidetector CT imaging of the chest was performed using thin slice collimation for electromagnetic bronchoscopy planning purposes, without intravenous contrast. RADIATION DOSE REDUCTION: This exam was performed according to the departmental dose-optimization program which includes automated exposure control, adjustment of the mA and/or kV according to patient size and/or use of iterative reconstruction technique. COMPARISON:  PET 02/04/2024 and CT chest 02/01/2024. FINDINGS: Cardiovascular: Atherosclerotic calcification of the aorta, aortic valve and coronary arteries. Heart is at the upper limits of normal in size. No pericardial effusion. Mediastinum/Nodes: No pathologically enlarged mediastinal or axillary lymph nodes. Hilar regions are difficult to definitively evaluate without IV contrast. Subcarinal lymph node measures 11 mm, previously 14 mm. Surgical clips in the right axilla. Air in the esophagus can be seen with dysmotility. Lungs/Pleura: Calcified granulomas. Right lower lobe mass measures 5.8 x 6.9 cm, stable. Patchy peribronchovascular ground-glass and nodularity in the surrounding right middle and right lower lobes. Additional peribronchovascular ground-glass in the lingula and left lower lobe. No pleural fluid. Airway is unremarkable. Upper Abdomen: Liver margin is slightly irregular. Visualized portions of the liver, gallbladder, adrenal glands, kidneys, spleen, pancreas, stomach and bowel are otherwise grossly unremarkable. No upper abdominal adenopathy. Musculoskeletal: Degenerative changes in the  spine. No worrisome lytic or sclerotic lesions. IMPRESSION: 1. Stable right lower lobe mass, consistent with biopsy-proven metastatic melanoma. 2. Patchy peribronchovascular ground-glass and nodularity in the lung bases, right greater than left, of indeterminate etiology but possibly treatment related. 3. Liver appears mildly cirrhotic. 4. Aortic atherosclerosis (ICD10-I70.0). Coronary artery calcification. Electronically Signed  By: Shearon Denis M.D.   On: 02/29/2024 14:23   CT Angio Chest PE W and/or Wo Contrast Result Date: 02/29/2024 CLINICAL DATA:  Known right lower lobe mass/neoplasm, PET positive, presenting with increased weakness and hypoxia with increased work of breathing. The patient had an open gastrostomy creation 02/23/2024. EXAM: CT ANGIOGRAPHY CHEST CT ABDOMEN AND PELVIS WITH CONTRAST TECHNIQUE: Multidetector CT imaging of the chest was performed using the standard protocol during bolus administration of intravenous contrast. Multiplanar CT image reconstructions and MIPs were obtained to evaluate the vascular anatomy. Multidetector CT imaging of the abdomen and pelvis was performed using the standard protocol during bolus administration of intravenous contrast. RADIATION DOSE REDUCTION: This exam was performed according to the departmental dose-optimization program which includes automated exposure control, adjustment of the mA and/or kV according to patient size and/or use of iterative reconstruction technique. CONTRAST:  OMNIPAQUE  IOHEXOL  350 MG/ML SOLN COMPARISON:  Chest CT without contrast 02/15/2024, PET-CT 02/04/2024, and a portable chest from 02/23/2024 and today. FINDINGS: CTA CHEST FINDINGS Cardiovascular: There is a new small anterior pericardial effusion. The cardiac size is normal. There are left main and 2 vessel coronary calcifications in the LAD and right coronary artery, heaviest in the LAD. There is a new left chest implanted port, with subclavian approach catheter  terminating at about the brachiocephalic/SVC junction. There is atherosclerosis in the aorta and great vessels without aneurysm, dissection or stenosis. No pulmonary arterial dilatation or embolism is seen. Pulmonary veins are normal caliber. There is bronchovascular mass encasement and narrowing along the lower hilar area in the lower lobe. Mediastinum/Nodes: There are increasingly prominent lymph nodes in the azygoesophageal recess, largest 1.5 x 2.5 cm on 2:90. There are increasingly prominent right lower hilar lymph nodes difficult to separate out from the right lower lobe mass. Largest of these is estimated 1.9 cm short axis on 2:91. There is an enlarging right mid hilar node of 1.5 cm on 2: 76, additional subcarinal lymph nodes up to 1 cm. I do not see further adenopathy. Lungs/Pleura: There is moderate fluid consistent with aspiration in the trachea, tracking down into right middle and lower lobe bronchi. Fluid is also seen in the right upper lobe main bronchus and there is diffuse bronchial thickening. There are multiple subsegmental and a few segmental fluid opacified bronchi in the right lower lobe basal segments with the right lower lobe distal main bronchus narrowed by the tumor. There is scattered subsegmental bronchial impactions in the right middle lobe. Right lower lobe mass abutting the hilum is centered in the superior segment and measures 7 x 5.6 cm on 2:10, not larger than on 02/15/2024 but on 02/04/2024 measured 6.5 x 5.5 cm. There is patchy ground-glass airspace disease in the left upper lobe, patchy denser ground-glass intermixed with peribronchovascular nodular airspace disease in the right upper lobe, ill-defined scattered hazy airspace disease in left lower lobe. In the right lower lobe, in addition to a fluid impactions in multiple subsegmental and downstream bronchi, there is patchy bronchopneumonia greatest in the superior segment, with patchy tree-in-bud and ground-glass airspace  disease in the right middle lobe. Findings consistent with multilobar pneumonia. No fluid is seen in the left-sided bronchi. There are no pleural effusions. Musculoskeletal: Degenerative change thoracic spine. No regional bone metastasis. At T8-9 and T9-10 there small left paracentral calcified disc extrusions without overt mass effect. Review of the MIP images confirms the above findings. CT ABDOMEN and PELVIS FINDINGS Hepatobiliary: No liver metastasis is seen. Unremarkable gallbladder and bile  ducts. Pancreas: No abnormality. Spleen: No abnormality. Adrenals/Urinary Tract: No adrenal renal mass enhancement. There is a 2 cm right lower pole parapelvic Bosniak 2 cyst with a Hounsfield density of 27, not requiring follow-up imaging. There is a parapelvic cyst in the lower left kidney which is too small to characterize consistent with another Bosniak 2 cyst. There is a 3 mm caliceal stone in the midpole of the left kidney. No other urolithiasis is seen and no obstructing stones. Normal bladder. Stomach/Bowel: There is a PEG tube in place. The balloon is in the stomach. There is moderate free air in the upper abdomen, more than expected 6 days out from PEG placement. A gastric leak is not excluded but there is no free fluid. Furthermore no focal inflammatory changes seen around the where the tube enters the stomach. Clinical correlation follow-up recommended. No bowel dilatation or wall thickening is seen. The appendix is normal. There is sigmoid diverticulosis, without diverticulitis. Vascular/Lymphatic: Aortic atherosclerosis. No enlarged abdominal or pelvic lymph nodes. Reproductive: The prostate appears to have been removed. No mass is seen in the prostate bed. There are no surgical clips in the pelvic sidewalls. Other: None. Musculoskeletal: There is no focally destructive lesion. The PET-CT demonstrated abnormal activity in the posterior superior facet of the left greater trochanter concerning for metastatic  disease, but which does not have a CT correlate. There is degenerative change of the lumbar spine most advanced at the lowest 2 levels where there is acquired spinal stenosis. 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 Review of the MIP images confirms the above findings. IMPRESSION: 1. No evidence of pulmonary arterial dilatation or embolus. 2. 7 x 5.6 cm right lower lobe mass centered in the superior segment, not larger than on 02/15/2024 but slightly increased since 02/04/2024. 3. Aspiration fluid in the trachea, right middle and lower lobe bronchi, some in the right upper lobe main bronchus, with diffuse bronchial thickening and with multilobar pneumonia described in detail above, right greater than left. 4. Increasingly prominent mediastinal and right hilar lymph nodes. 5. New small anterior pericardial effusion. 6. Aortic and coronary artery atherosclerosis. 7. PEG tube in place with the balloon in the stomach. There is moderate free air in the upper abdomen more than expected 6 days out from PEG placement. A gastric leak is not excluded but there is no free fluid. Furthermore no focal inflammatory changes seen around the where the tube enters the stomach. Clinical correlation and follow-up recommended. 8. No evidence of metastatic disease in the abdomen or pelvis. 9. 3 mm nonobstructing left renal stone. 10. Diverticulosis without evidence of diverticulitis. 11. The PET-CT demonstrated abnormal activity in the posterior superior facet of the left greater trochanter concerning for metastatic disease, but which does not have a CT correlate. 12. Critical Value/emergent results were called by telephone at the time of interpretation on 02/29/2024 at 7:36 am to provider DR. Cleora Daft, who verbally acknowledged these results. Aortic Atherosclerosis (ICD10-I70.0). Electronically Signed   By: Denman Fischer M.D.   On: 02/29/2024 08:11   CT ABDOMEN PELVIS W CONTRAST Result Date: 02/29/2024 CLINICAL DATA:  Known  right lower lobe mass/neoplasm, PET positive, presenting with increased weakness and hypoxia with increased work of breathing. The patient had an open gastrostomy creation 02/23/2024. EXAM: CT ANGIOGRAPHY CHEST CT ABDOMEN AND PELVIS WITH CONTRAST TECHNIQUE: Multidetector CT imaging of the chest was performed using the standard protocol during bolus administration of intravenous contrast. Multiplanar CT image reconstructions and MIPs were obtained to evaluate the  vascular anatomy. Multidetector CT imaging of the abdomen and pelvis was performed using the standard protocol during bolus administration of intravenous contrast. RADIATION DOSE REDUCTION: This exam was performed according to the departmental dose-optimization program which includes automated exposure control, adjustment of the mA and/or kV according to patient size and/or use of iterative reconstruction technique. CONTRAST:  OMNIPAQUE  IOHEXOL  350 MG/ML SOLN COMPARISON:  Chest CT without contrast 02/15/2024, PET-CT 02/04/2024, and a portable chest from 02/23/2024 and today. FINDINGS: CTA CHEST FINDINGS Cardiovascular: There is a new small anterior pericardial effusion. The cardiac size is normal. There are left main and 2 vessel coronary calcifications in the LAD and right coronary artery, heaviest in the LAD. There is a new left chest implanted port, with subclavian approach catheter terminating at about the brachiocephalic/SVC junction. There is atherosclerosis in the aorta and great vessels without aneurysm, dissection or stenosis. No pulmonary arterial dilatation or embolism is seen. Pulmonary veins are normal caliber. There is bronchovascular mass encasement and narrowing along the lower hilar area in the lower lobe. Mediastinum/Nodes: There are increasingly prominent lymph nodes in the azygoesophageal recess, largest 1.5 x 2.5 cm on 2:90. There are increasingly prominent right lower hilar lymph nodes difficult to separate out from the right  lower lobe mass. Largest of these is estimated 1.9 cm short axis on 2:91. There is an enlarging right mid hilar node of 1.5 cm on 2: 76, additional subcarinal lymph nodes up to 1 cm. I do not see further adenopathy. Lungs/Pleura: There is moderate fluid consistent with aspiration in the trachea, tracking down into right middle and lower lobe bronchi. Fluid is also seen in the right upper lobe main bronchus and there is diffuse bronchial thickening. There are multiple subsegmental and a few segmental fluid opacified bronchi in the right lower lobe basal segments with the right lower lobe distal main bronchus narrowed by the tumor. There is scattered subsegmental bronchial impactions in the right middle lobe. Right lower lobe mass abutting the hilum is centered in the superior segment and measures 7 x 5.6 cm on 2:10, not larger than on 02/15/2024 but on 02/04/2024 measured 6.5 x 5.5 cm. There is patchy ground-glass airspace disease in the left upper lobe, patchy denser ground-glass intermixed with peribronchovascular nodular airspace disease in the right upper lobe, ill-defined scattered hazy airspace disease in left lower lobe. In the right lower lobe, in addition to a fluid impactions in multiple subsegmental and downstream bronchi, there is patchy bronchopneumonia greatest in the superior segment, with patchy tree-in-bud and ground-glass airspace disease in the right middle lobe. Findings consistent with multilobar pneumonia. No fluid is seen in the left-sided bronchi. There are no pleural effusions. Musculoskeletal: Degenerative change thoracic spine. No regional bone metastasis. At T8-9 and T9-10 there small left paracentral calcified disc extrusions without overt mass effect. Review of the MIP images confirms the above findings. CT ABDOMEN and PELVIS FINDINGS Hepatobiliary: No liver metastasis is seen. Unremarkable gallbladder and bile ducts. Pancreas: No abnormality. Spleen: No abnormality. Adrenals/Urinary  Tract: No adrenal renal mass enhancement. There is a 2 cm right lower pole parapelvic Bosniak 2 cyst with a Hounsfield density of 27, not requiring follow-up imaging. There is a parapelvic cyst in the lower left kidney which is too small to characterize consistent with another Bosniak 2 cyst. There is a 3 mm caliceal stone in the midpole of the left kidney. No other urolithiasis is seen and no obstructing stones. Normal bladder. Stomach/Bowel: There is a PEG tube in place. The  balloon is in the stomach. There is moderate free air in the upper abdomen, more than expected 6 days out from PEG placement. A gastric leak is not excluded but there is no free fluid. Furthermore no focal inflammatory changes seen around the where the tube enters the stomach. Clinical correlation follow-up recommended. No bowel dilatation or wall thickening is seen. The appendix is normal. There is sigmoid diverticulosis, without diverticulitis. Vascular/Lymphatic: Aortic atherosclerosis. No enlarged abdominal or pelvic lymph nodes. Reproductive: The prostate appears to have been removed. No mass is seen in the prostate bed. There are no surgical clips in the pelvic sidewalls. Other: None. Musculoskeletal: There is no focally destructive lesion. The PET-CT demonstrated abnormal activity in the posterior superior facet of the left greater trochanter concerning for metastatic disease, but which does not have a CT correlate. There is degenerative change of the lumbar spine most advanced at the lowest 2 levels where there is acquired spinal stenosis. 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 Review of the MIP images confirms the above findings. IMPRESSION: 1. No evidence of pulmonary arterial dilatation or embolus. 2. 7 x 5.6 cm right lower lobe mass centered in the superior segment, not larger than on 02/15/2024 but slightly increased since 02/04/2024. 3. Aspiration fluid in the trachea, right middle and lower lobe bronchi, some in the  right upper lobe main bronchus, with diffuse bronchial thickening and with multilobar pneumonia described in detail above, right greater than left. 4. Increasingly prominent mediastinal and right hilar lymph nodes. 5. New small anterior pericardial effusion. 6. Aortic and coronary artery atherosclerosis. 7. PEG tube in place with the balloon in the stomach. There is moderate free air in the upper abdomen more than expected 6 days out from PEG placement. A gastric leak is not excluded but there is no free fluid. Furthermore no focal inflammatory changes seen around the where the tube enters the stomach. Clinical correlation and follow-up recommended. 8. No evidence of metastatic disease in the abdomen or pelvis. 9. 3 mm nonobstructing left renal stone. 10. Diverticulosis without evidence of diverticulitis. 11. The PET-CT demonstrated abnormal activity in the posterior superior facet of the left greater trochanter concerning for metastatic disease, but which does not have a CT correlate. 12. Critical Value/emergent results were called by telephone at the time of interpretation on 02/29/2024 at 7:36 am to provider DR. Cleora Daft, who verbally acknowledged these results. Aortic Atherosclerosis (ICD10-I70.0). Electronically Signed   By: Denman Fischer M.D.   On: 02/29/2024 08:11   DG Chest Port 1 View Result Date: 02/29/2024 CLINICAL DATA:  Increased shortness of breath. EXAM: PORTABLE CHEST 1 VIEW COMPARISON:  02/23/2024 FINDINGS: There is a left chest wall porta catheter with tip in the projection of the SVC. Heart size and mediastinal contours are unremarkable. Right lower lobe lung mass is again noted compatible with malignancy. No superimposed pleural effusion, edema or airspace consolidation. Interval decrease in pneumoperitoneum compared with the previous exam. Visualized osseous structures appear intact. IMPRESSION: 1. No acute cardiopulmonary abnormalities. 2. Right lower lobe lung mass compatible with malignancy.  3. Interval decrease in pneumoperitoneum compared with the previous exam. Electronically Signed   By: Kimberley Penman M.D.   On: 02/29/2024 06:23   DG [facility] MEDICARE SPEECH PATH Result Date: 02/25/2024 CLINICAL DATA:  73 year old male with history of CVA, with esophageal dysphagia. EXAM: MODIFIED BARIUM SWALLOW TECHNIQUE: Different consistencies of barium were administered orally to the patient by the Speech Pathologist. Imaging of the pharynx was performed in  the lateral projection. Charles care, PA-C was present in the fluoroscopy room during this study, which was supervised and interpreted by Dr. Myrlene Asper, MD. FLUOROSCOPY: Radiation Exposure Index (as provided by the fluoroscopic device): 12.30 mGy Kerma COMPARISON:  None Available. FINDINGS: Vestibular  Penetration:  Noted with thin liquids Aspiration:  Noted with thin liquids Other: Patient experienced difficulty swallowing soft foods as well, with ineffective swallow reflex. Early spillage. IMPRESSION: Vestibular penetration with aspiration noted with thin liquids. Poor swallowing reflex noted with soft foods. Please refer to the Speech Pathologists report for complete details and recommendations. Electronically Signed   By: Myrlene Asper D.O.   On: 02/25/2024 15:05   DG Chest Port 1 View Result Date: 02/23/2024 CLINICAL DATA:  Port-A-Cath placement. EXAM: PORTABLE CHEST 1 VIEW COMPARISON:  Feb 16, 2024. FINDINGS: The heart size and mediastinal contours are within normal limits. Left subclavian Port-A-Cath is noted with distal tip in expected position of the SVC. No pneumothorax is noted. Stable large right lower lobe mass is noted concerning for malignancy. Free air is noted under both hemidiaphragms consistent with gastrostomy placement today. The visualized skeletal structures are unremarkable. IMPRESSION: Left subclavian Port-A-Cath is noted with distal tip in expected position of the SVC. Stable right lower lobe mass is noted consistent  with malignancy. Electronically Signed   By: Rosalene Colon M.D.   On: 02/23/2024 12:16   DG C-Arm 1-60 Min-No Report Result Date: 02/23/2024 Fluoroscopy was utilized by the requesting physician.  No radiographic interpretation.   DG Chest Port 1 View Result Date: 02/16/2024 CLINICAL DATA:  Status post bronchoscopy, right lower lobe mass EXAM: PORTABLE CHEST 1 VIEW COMPARISON:  02/01/2024 FINDINGS: 7.2 cm right lower lobe mass is again observed. No pneumothorax or pneumomediastinum. Low lung volumes. Thoracic spondylosis. Heart size within normal limits. IMPRESSION: 1. No pneumothorax or pneumomediastinum. 2. 7.2 cm right lower lobe mass. 3. Low lung volumes. Electronically Signed   By: Freida Jes M.D.   On: 02/16/2024 11:58   DG C-Arm 1-60 Min-No Report Result Date: 02/16/2024 Fluoroscopy was utilized by the requesting physician.  No radiographic interpretation.   MR BRAIN W WO CONTRAST Result Date: 02/09/2024 CLINICAL DATA:  Initial metastatic disease evaluation. EXAM: MRI HEAD WITHOUT AND WITH CONTRAST TECHNIQUE: Multiplanar, multiecho pulse sequences of the brain and surrounding structures were obtained without and with intravenous contrast. CONTRAST:  8mL GADAVIST  GADOBUTROL  1 MMOL/ML IV SOLN COMPARISON:  Comparison made with prior MRIs from 02/01/2024 and 02/05/2024. FINDINGS: Brain: Cerebral volume within normal limits. No significant cerebral white matter disease for age. No evidence for acute or subacute infarct. No areas of chronic cortical infarction. No acute or significant chronic intracranial blood products. Abnormal nodular thickening and enhancement seen about multiple cranial nerves at the skull base. Changes are most pronounced about the nerve root entry zones of the trigeminal nerves bilaterally (series 18, image 59). Prominent involvement of the seventh and 8 nerve cranial nerve complexes as well (series 18, image 48). Involvement of the hypoglossal nerves. Subtle nodular left  a meningeal enhancement seen about the cerebellum, most pronounced at the vermis (series 18, images 75, 68, 61). Thickening and enhancement about the pituitary stalk (series 18, image 73). 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 Mild associated edema within the brain parenchyma itself of the pons left middle cerebellar peduncle (series 11, image 18). No other significant mass effect or associated edema. No other mass lesion or abnormal enhancement. Ventricles normal size without hydrocephalus. No extra-axial fluid collections. No other  abnormal enhancement. Vascular: Major intracranial vascular flow voids are maintained. Skull and upper cervical spine: Craniocervical junction within normal limits. Bone marrow signal intensity within normal limits. No focal marrow replacing lesion. No scalp soft tissue abnormality. Sinuses/Orbits: Prior ocular lens replacement on the left. Questionable focus of asymmetric enhancement at the posterior aspect of the left lobe near the optic nerve insertion (series 18, image 63). Orbital soft tissues otherwise unremarkable. Paranasal sinuses are largely clear. No significant mastoid effusion. Other: None. IMPRESSION: 1. Abnormal nodular thickening and enhancement about multiple cranial nerves at the skull base and pituitary stalk, with additional subtle leptomeningeal enhancement about the cerebellum. 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 2. Mild associated edema within the brain parenchyma of the pons/left middle cerebellar peduncle. 3. Questionable focus of asymmetric enhancement at the posterior aspect of the left globe near the optic nerve insertion. Correlation with fundoscopic examination suggested. Additionally, attention at follow-up recommended. 4. No other acute intracranial abnormality. Electronically Signed   By: Virgia Griffins M.D.   On: 02/09/2024 20:52   MR  THORACIC SPINE W WO CONTRAST Result Date: 02/09/2024 CLINICAL DATA:  Initial metastatic disease evaluation. EXAM: MRI THORACIC WITHOUT AND WITH CONTRAST TECHNIQUE: Multiplanar and multiecho pulse sequences of the thoracic spine were obtained without and with intravenous contrast. CONTRAST:  8mL GADAVIST  GADOBUTROL  1 MMOL/ML IV SOLN COMPARISON:  Prior MRI from 02/05/2024. FINDINGS: Alignment: Trace scoliosis. Alignment otherwise normal with preservation of the normal thoracic kyphosis. No listhesis. Vertebrae: Vertebral body height maintained without acute or chronic fracture. Bone marrow signal intensity within normal limits. Benign hemangioma noted within the T3 vertebral body. No evidence for osseous metastatic disease. Degenerative reactive endplate change with marrow edema present about the T12-L1 interspace. No other abnormal marrow edema or enhancement. Cord: Few small nodular foci of enhancement seen about the visualized distal conus/cauda equina at the level of L1, concerning for leptomeningeal disease as seen on prior MRI of the lumbar spine (series 24, images 8, 9, 10). No other evidence for intracanalicular metastatic disease within the thoracic spine. Cord itself demonstrates normal signal and morphology. Paraspinal and other soft tissues: Paraspinous soft tissues within normal limits. Patient's known right lung mass partially visualized. Disc levels: T1-2: Negative interspace.  Mild facet hypertrophy.  No stenosis. T2-3: Minimal disc bulge. Mild facet hypertrophy. No spinal stenosis. Mild bilateral foraminal narrowing. T3-4:  Negative interspace.  Mild facet hypertrophy.  No stenosis. T4-5: Negative interspace. Mild facet hypertrophy. No spinal stenosis. Mild right foraminal narrowing. T5-6: Negative interspace. Mild facet hypertrophy. No significant stenosis. T6-7: Left paracentral disc protrusion indents the left ventral thecal sac. Mild facet hypertrophy. No stenosis. T7-8: Left paracentral disc  extrusion with inferior migration indents the ventral thecal sac. Flattening of the left ventral cord without cord signal changes or significant spinal stenosis. Foramina remain patent. T8-9: Left paracentral disc extrusion with possible superior migration. Flattening of the left ventral cord without cord signal changes or significant spinal stenosis. Foramina remain patent. T9-10: Left paracentral disc extrusion with superior migration. Flattening of the left ventral cord without cord signal changes or significant spinal stenosis. Mild bilateral facet hypertrophy with resultant mild right foraminal stenosis. Left neural foramina remains adequately patent. T10-11: Minimal disc bulge. Mild facet hypertrophy. No spinal stenosis. Mild right with moderate left foraminal stenosis. T11-12: Diffuse disc bulge with endplate spurring. Mild facet hypertrophy. No significant stenosis. T12-L1: Degenerative disc space narrowing diffuse disc bulge and disc desiccation. Reactive endplate spurring. Moderate facet hypertrophy. Resultant mild-to-moderate  spinal stenosis with moderate bilateral foraminal narrowing. IMPRESSION: 1. Few small nodular foci of enhancement about the visualized distal conus/cauda equina at the level of L1, concerning for leptomeningeal disease as seen on prior MRI of the lumbar spine. No other evidence for intracanalicular or osseous metastatic disease within the thoracic spine. 2. Solid right lung mass, partially visualized, and better characterized on recent chest CT. 3. 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 4. Multifactorial degenerative changes with resultant multilevel foraminal narrowing as above. Notable findings include mild right foraminal stenosis at T4-5 and T9-10, with moderate left foraminal narrowing at T10-11. Electronically Signed   By: Virgia Griffins M.D.   On: 02/09/2024 18:31   MR CERVICAL SPINE W WO CONTRAST Result Date:  02/09/2024 CLINICAL DATA:  Initial evaluation for metastatic disease evaluation. EXAM: MRI CERVICAL SPINE WITHOUT AND WITH CONTRAST TECHNIQUE: Multiplanar and multiecho pulse sequences of the cervical spine, to include the craniocervical junction and cervicothoracic junction, were obtained without and with intravenous contrast. CONTRAST:  8mL GADAVIST  GADOBUTROL  1 MMOL/ML IV SOLN COMPARISON:  Prior MRI from 02/05/2024. FINDINGS: Alignment: Mild dextroscoliosis with straightening of the normal cervical lordosis. Trace degenerative anterolisthesis of C2 on C3 and C3 on C4. Vertebrae: Vertebral body height maintained without acute or chronic fracture. Bone marrow signal intensity within normal limits. Small benign hemangioma noted within the T3 vertebral body. No evidence for osseous metastatic disease. No abnormal marrow edema or enhancement. Cord: Normal signal and morphology. No evidence for intracanalicular metastatic disease within the cervical spine. Posterior Fossa, vertebral arteries, paraspinal tissues: Abnormal thickening and enhancement seen about multiple cranial nerves at the visualized skull base (series 12, images 6, 15). 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 Degenerative thickening noted about the tectorial membrane without significant stenosis at the craniocervical junction. Paraspinous soft tissues within normal limits. Normal flow voids seen within the vertebral arteries bilaterally. Disc levels: C2-C3: Trace anterolisthesis. Mild uncovertebral spurring without significant disc bulge. Moderate left with mild right facet hypertrophy. No significant spinal stenosis. Mild left C3 foraminal narrowing. Right neural foramina remains patent. C3-C4: Trace anterolisthesis. Small left paracentral disc protrusion mildly indents the ventral thecal sac. Left greater than right uncovertebral and facet hypertrophy. Mild spinal stenosis. Severe left  with mild right C4 foraminal narrowing. C4-C5: Disc bulge with moderate right with mild left C5 foraminal stenosis. C5-C6: Degenerative disc space narrowing with diffuse disc osteophyte complex. Flattening and partial effacement of the ventral thecal sac. Superimposed facet hypertrophy. Moderate spinal stenosis with severe right worse than left C6 foraminal narrowing. C6-C7: Degenerative disc space narrowing with diffuse disc osteophyte complex. Flattening and partial effacement of the ventral thecal sac. Superimposed mild facet hypertrophy. Moderate spinal stenosis with severe bilateral C7 foraminal narrowing. C7-T1: Tiny left paracentral disc protrusion. Mild facet hypertrophy. No spinal stenosis. Foramina remain adequately patent. IMPRESSION: 1. 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 2. No other evidence for osseous or intracanalicular metastatic disease within the cervical spine. 3. 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 4. Multifactorial degenerative changes with resultant multilevel foraminal narrowing as above. Notable findings include severe left C4 foraminal stenosis, moderate right C5 foraminal narrowing, severe bilateral C6 and C7 foraminal stenosis, with mild left C3 foraminal narrowing. Electronically Signed   By: Virgia Griffins M.D.   On: 02/09/2024 18:19   MR Lumbar Spine W  Wo Contrast Result Date: 02/05/2024 CLINICAL DATA:  Lung mass. Prostate cancer. Melanoma. Abnormal PET scan. EXAM: MRI LUMBAR SPINE WITHOUT AND WITH CONTRAST TECHNIQUE: Multiplanar and multiecho pulse sequences of the lumbar spine were obtained without and with intravenous contrast. CONTRAST:  7.5mL GADAVIST  GADOBUTROL  1 MMOL/ML IV SOLN COMPARISON:  Whole body PET scan 02/04/2024 FINDINGS: Segmentation: 5  non rib-bearing lumbar type vertebral bodies are present. The lowest fully formed vertebral body is L5. Alignment: Slight retrolisthesis present at L1-2 and L2-3. Grade 1 anterolisthesis is present at L5-S1. Straightening of the normal lumbar lordosis is present. Levoconvex curvature is centered at L4-5. Vertebrae: Mixed type 1 and type 2 Modic changes are present at L3-4. Edematous changes are present on the right. More chronic type 2 Modic changes are present posteriorly on the right at L5-S1 and posteriorly on the right at L2-3. Edematous endplate changes are present posteriorly at L1-2. Vertebral body heights are maintained. No osseous metastases are present. Conus medullaris and cauda equina: Conus extends to the L2 level. Diffuse dural enhancement is present. Nodular enhancement is present within the conus medullaris. A dorsal and right lateral nodule is present at L2 adjacent to the conus. Ventral nodule and posterior dural enhancement is present at the L2-3 level. A dorsal or dural nodule is present at L4. A 7 mm enhancing nodule is present laterally at the L4-5 disc level, corresponding to the traversing left L5 nerve roots. Paraspinal and other soft tissues: An 18 mm simple cyst is noted at the lower pole of the right kidney. No other solid organ lesions are present. No significant adenopathy is present. Disc levels: T12-L1: Mild disc bulging is present without focal stenosis. L1-2: A right paramedian disc protrusion is present. Mild central canal stenosis is present. Disc extends into the foramina bilaterally. Moderate bilateral foraminal stenosis is present. L2-3: A leftward disc protrusion is present. Moderate facet hypertrophy is seen bilaterally. This results an central canal stenosis with moderate left and mild right subarticular narrowing. Moderate foraminal narrowing is worse on the left. L3-4: A broad-based disc protrusion is present. Moderate facet hypertrophy is noted bilaterally. This results  in moderate to severe central canal stenosis. Severe right and moderate left foraminal stenosis is present. L4-5: A broad-based disc protrusion is present. Moderate facet hypertrophy is noted bilaterally. Moderate foraminal narrowing is present bilaterally. L5-S1: Chronic loss of disc height is present. Moderate facet hypertrophy is worse on the right. Mild right subarticular narrowing is present. Moderate foraminal stenosis is worse right than left. IMPRESSION: 1. Diffuse dural enhancement with nodular enhancement within the conus medullaris. 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 2. 7 mm enhancing nodule laterally at the L4-5 disc level, corresponding to the traversing left L5 nerve roots. 3. No osseous metastases. 4. Multilevel spondylosis of the lumbar spine as described. 5. Mild central canal stenosis at L1-2. 6. Moderate bilateral foraminal stenosis at L1-2. 7. Moderate left and mild right subarticular narrowing at L2-3. 8. Moderate to severe central canal stenosis at L3-4 with severe right and moderate left foraminal stenosis. 9. Moderate foraminal narrowing bilaterally at L4-5 and L5-S1. These results will be called to the ordering clinician or representative by the Radiologist Assistant, and communication documented in the PACS or [employer]. Electronically Signed   By: Audree Leas M.D.   On: 02/05/2024 19:41   NM PET Image Initial (PI) Skull Base To Thigh (F-18 FDG) Result Date: 02/04/2024 CLINICAL DATA:  Initial treatment strategy for lung nodule. Additional history of prostate  cancer and melanoma. EXAM: NUCLEAR MEDICINE PET SKULL BASE TO THIGH TECHNIQUE: 10.31 mCi F-18 FDG was injected intravenously. Full-ring PET imaging was performed from the skull base to thigh after the radiotracer. CT data was obtained and used for attenuation correction and anatomic localization. Fasting blood glucose: 148 mg/dl COMPARISON:  PET-CT report November 2015.  Chest CT  02/01/2024 FINDINGS: Mediastinal blood pool activity: SUV max 3.1 Liver activity: SUV max 3.4 NECK: No specific abnormal radiotracer uptake seen including along lymph node chains of the submandibular, posterior triangle or internal jugular region. Near symmetric uptake of the visualized intracranial compartment. Incidental CT findings: Visualized paranasal sinuses and mastoid air cells are clear. Streak artifact related to patient's dental hardware. The parotid glands, submandibular glands and thyroid  glands unremarkable. CHEST: Large heterogeneous right lower lobe lung mass again identified corresponding to the 6.7 cm lesion seen on the recent CT scan of the chest with maximum SUV value of 21.1 worrisome for neoplasm. No additional areas of abnormal uptake along the lung parenchyma. There is some mild uptake along the right infrahilar region which may be small nodes. The right lower lobe mass as abut the hilum these nodes have maximum SUV value of 8.2. No additional areas of abnormal uptake elsewhere in the chest including mediastinum, left hilum or axillary regions. The subcarinal nodes described on the CT scan have lower level of uptake with maximum SUV of 3.4. Incidental CT findings: Scattered vascular calcifications are seen including along the coronary arteries. No pericardial effusion. The thoracic aorta is normal course and caliber. Breathing motion. No pleural effusion or pneumothorax. Calcified tiny nodules in the left lung are again noted as on prior. ABDOMEN/PELVIS: There is physiologic distribution radiotracer along the parenchymal organs, bowel and renal collecting systems. Incidental CT findings: Nonobstructing midportion left-sided renal stone. Right-sided central renal cyst. Contracted urinary bladder. Large bowel has a normal course and caliber with scattered stool. Left-sided colonic diverticula. Normal appendix. Gallbladder is nondilated with dependent stones. Nonspecific perinephric stranding,  right greater than left. SKELETON: Focal area of abnormal uptake seen along the left hip greater trochanter with faint area of sclerosis. Maximum SUV value of 8.4 worrisome for a skeletal metastasis. Is also focus of uptake with maximum SUV value of 8.4 corresponding to the central canal of the spine at the L3-4 level. Small soft tissue nodule in this location on CT image 104. This has a differential. Incidental CT findings: Scattered degenerative changes along the spine. Multilevel degenerative changes particularly with stenosis in the lumbar region. Trace listhesis. Degenerative changes as well of the shoulders and pelvis. IMPRESSION: Large hypermetabolic right lower lobe lung mass as seen on prior CT exam scanned extending to the hilum consistent with neoplasm. Uptake in the right hilum could represent some adjacent nodes. Hypermetabolic sclerotic lesion involving the left hip greater trochanter worrisome for a skeletal metastasis. Focus area of uptake along the central canal of the spine at L3-4 left of midline is also seen. This would have a differential. An additional metastatic lesion is 1 of those differential. Recommend further workup with lumbar spine MRI with and without contrast to further delineate. Electronically Signed   By: Adrianna Horde M.D.   On: 02/04/2024 11:56    PERFORMANCE STATUS (ECOG) : 4 - Bedbound  Review of Systems Unable to provide  Physical Exam General: Ill-appearing Pulmonary: Unlabored Extremities: no edema, no joint deformities Skin: no rashes Neurological: Poorly responsive  IMPRESSION: Follow-up visit.  Patient comfortable appearing.  He is receiving as needed morphine   and lorazepam.  Less oral secretions today after glycopyrrolate .  I spoke with patient's wife.  She is interested in exploring possible transfer to inpatient hospice facility.  She asked specifically about [facility] in [place].  Will have the hospice liaison speak with family to coordinate  disposition.  PLAN: -Comfort care -DNR/DNI  Time Total: 15 minutes  Visit consisted of counseling and education dealing with the complex and emotionally intense issues of symptom management and palliative care in the setting of serious and potentially life-threatening illness.Greater than 50%  of this time was spent counseling and coordinating care related to the above assessment and plan.  Signed by: Gerilyn Kobus, PhD, NP-C

## 2024-03-13 NOTE — Progress Notes (Signed)
 Assessed patient with 2nd RN, pt asystole on the monitor, no breathing, no palpable pulse, no BP, pupils non reactive. Family at bedside when pt expired. Dr Alva Jewels notified. No concerns from family.

## 2024-03-13 NOTE — TOC Transition Note (Signed)
 Transition of Care [facility]) - Discharge Note   Patient Details  Name: Mason Stewart MRN: [id number] Date of Birth: 1951-09-27  Transition of [facility] CM/SW Contact:  Crayton Docker, RN 02/27/2024, 3:45 PM   Clinical Narrative:     Alert received from RN Lonzell Robin, patient expired.   Final next level of care: Expired Barriers to Discharge: No Barriers Identified   Patient Goals and CMS Choice    Not applicable  Discharge Placement               Not applicable  Discharge Plan and Services Additional resources added to the After Visit Summary for      Social Drivers of Health (SDOH) Interventions SDOH Screenings   Food Insecurity: No Food Insecurity (02/29/2024)  Housing: Low Risk  (02/29/2024)  Transportation Needs: Patient Declined (03/01/2024)  Utilities: Not At Risk (02/29/2024)  Depression (PHQ2-9): Low Risk  (02/19/2024)  Social Connections: Moderately Integrated (02/29/2024)  Tobacco Use: Low Risk  (02/29/2024)     Readmission Risk Interventions     No data to display

## 2024-03-13 NOTE — Care Management Important Message (Signed)
 Important Message  Patient Details  Name: Mason Stewart MRN: [id number] Date of Birth: [birth year]   Important Message Given:  Yes - Medicare IM     Lanna Labella W, CMA 03/05/2024, 12:33 PM

## 2024-03-13 NOTE — Progress Notes (Signed)
 PROGRESS NOTE Mason Stewart  ZOX:[id number] DOB: September 22, 1951 DOA: 02/29/2024 PCP: Helaine Llanos, MD   Brief Narrative:  73 y.o. 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 patient has very poor tolerance to the tube feeding, and has had frequent feeling of nauseous vomiting. Family in consultation with each other, patient when coherent, and discussions with palliative elected to transition to comfort care 5/20. Appears to have a stable disposition at this time and family is considering transport to hospice facility or possibly home.   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  There is evidence of aspiration of the right middle and right lower lobes.  Etiology of aspiration likely secondary to pharyngeal dysfunction in the setting of metastatic brain lesion. - morhpine PRN for air hunger - ativan PRN for agitation, anxiety - robinol, suction for secretions - avoid tube feeds  Free air in the peritoneal cavity- No significant leakage around the PEG  tube insertion site.  Case discussed with general surgery Dr. Mauri Sous.  No urgent surgical intervention warranted.  - analgesia PRN   Severe sepsis, with acute endorgan damage - Sepsis evidenced by hypoxia leukocytosis, source of infection is right-sided aspiration pneumonia and management as above.  Blood pressure was low and requiring IV boluses in the ED.  Acute endorgan damage including metabolic encephalopathy.  Antibiotics are discontinued at this time.    Metastatic melanoma, recurrent- - Imaging studies so far has showed multiple metastatic sites including brain right side of the lungs, left hip, L3-4.  Oncology palliative care following   DVT prophylaxis: none Code Status: comfort, DNR Family Communication: Wife at bedside Disposition Plan: Status is: Inpatient Remains inpatient appropriate because: not stable for transportation   Level of care: Telemetry Medical  Consultants:  Palliative care  Procedures:  None  Antimicrobials: Zosyn   Subjective: Pt is unresponsive today. Appears more comfortable. Wife at bedside  Objective: Vitals:   03/02/24 0516 03/02/24 0746 03/02/24 2035 03/01/2024 0500  BP:  (!) 165/110 126/73   Pulse:  (!) 132 (!) 123   Resp:  (!) 22 (!) 21   Temp:  100.2 F (37.9 C) 98.2 F (36.8 C)   TempSrc:      SpO2:  (!) 89% (!) 69%   Weight: 74.5 kg   74.7 kg  Height:        Intake/Output Summary (Last 24 hours) at 02/22/2024 0710 Last data filed at 03/02/2024 0500 Gross per 24 hour  Intake --  Output 500 ml  Net -500 ml   Filed Weights   02/29/24 0553 03/02/24 0516 02/14/2024 0500  Weight: 77.3 kg 74.5 kg 74.7 kg  Examination:  General exam: Ill-appearing Respiratory system: normal effort. Stertor resolved Cardiovascular system: warm extremities  Gastrointestinal system: Soft, nontender, PEG without leakage Central nervous system: not alert to voice Skin: No rashes, lesions or ulcers  Data Reviewed: I have personally reviewed  following labs and imaging studies  CBC: Recent Labs  Lab 02/26/24 1305 02/29/24 0557 03/01/24 0432  WBC 13.1* 34.3* 18.3*  NEUTROABS 10.8* 31.0*  --   HGB 12.5* 13.9 11.6*  HCT 34.4* 38.2* 32.0*  MCV 84.9 85.1 85.3  PLT 296 372 252   Basic Metabolic Panel: Recent Labs  Lab 02/26/24 1255 02/26/24 1304 02/29/24 0557 02/29/24 0756 03/01/24 0432  NA  --  131* 134*  --  135  K  --  3.4* 3.2*  --  2.8*  CL  --  95* 95*  --  102  CO2  --  27 27  --  23  GLUCOSE  --  148* 195*  --  143*  BUN  --  14 17  --  12  CREATININE  --  0.70 0.69  --  0.54*  CALCIUM   --  8.8* 9.2  --  7.8*  MG 2.1  --   --  1.7  --   PHOS 3.0  --   --  2.9  --     LOS: 3 days    Ree Candy, MD [facility]   If 7PM-7AM, please contact night-coverage  02/26/2024, 7:10 AM

## 2024-03-13 NOTE — Death Summary Note (Signed)
   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 patient has very poor tolerance to the tube feeding, and has had frequent feeling of nauseous vomiting. Appeared to have aspiration pneumonia and very poor prognosis for recovery and inability to continue to receive nutrition without aspiration. Family in consultation with each other, patient when coherent, and discussions with palliative elected to transition to comfort care 5/20. He was kept comfortable and passed away Mar 22, 2024 with family at bedside.   Time spent: 25 minutes  Signed: Ree Candy, MD 03-22-2024

## 2024-03-13 NOTE — Progress Notes (Addendum)
 [facility] Liaison Note   Received referral from [facility], Marcela Senters, RN,  to meet with family  at bedside to explain hospice services.  HL  met with patient's spouse and two daughters at bedside and provided extensive education for hospice services as they related to the inpatient unit and hospice services at home.    Family requested additional time to consider options before proceeding. All questions answered and no concerns voiced. HL will follow up with family tomorrow.  TOC and hospital team notified.     [facility] information and contact numbers given to family.   Please call with any questions/concerns.    Thank you for the opportunity to participate in this patient's care.  [facility] Liaison [phone]

## 2024-03-13 NOTE — TOC Initial Note (Addendum)
 Transition of Care [facility]) - Initial/Assessment Note    Patient Details  Name: Mason Stewart MRN: [id number] Date of Birth: 1950/12/21  [facility]) CM/SW Contact:    Crayton Docker, RN 03/05/2024, 1:11 PM  Clinical Narrative:                  CM to patient's room regarding screening assessment. Patient sleeping. Patient wife and patient family at bedside. Per patient family requests time to discuss discharge options regarding possible hospice home versus inpatient and will let CM know. CM provide name and contact number for questions and or concerns. CM alert to Shelvia Dick regarding hospice referral CM will continue to follow.   Alert from [employer], patient expired.  Expected Discharge Plan: Skilled Nursing Facility Barriers to Discharge: Continued Medical Work up   Patient Goals and CMS Choice    TBD per patient family  Expected Discharge Plan and Services    TBD per patient family      Prior Living Arrangements/Services   Lives with:: Spouse     Current home services: DME ([employer])    Activities of Daily Living   ADL Screening (condition at time of admission) Independently performs ADLs?: Yes (appropriate for developmental age) Is the patient deaf or have difficulty hearing?: No Does the patient have difficulty seeing, even when wearing glasses/contacts?: No Does the patient have difficulty concentrating, remembering, or making decisions?: No  Permission Sought/Granted      Share Information with NAME: Wanda/Lorie/Annie     Permission granted to share info w Relationship: Wife/Daughter/Daughter     Emotional Assessment Appearance:: Appears stated age Attitude/Demeanor/Rapport:  (Patient sleeping) Affect (typically observed):  (Patient sleeping unable to assess)        Admission diagnosis:  Aspiration pneumonia (HCC) [J69.0] SOB (shortness of breath) [R06.02] Acute respiratory failure with hypoxia (HCC) [J96.01] Patient  Active Problem List   Diagnosis Date Noted   Protein-calorie malnutrition, severe 03/02/2024   Acute encephalopathy 03/02/2024   Goals of care, counseling/discussion 03/02/2024   Sepsis (HCC) 02/29/2024   Aspiration pneumonia (HCC) 02/29/2024   Acute respiratory failure with hypoxia (HCC) 02/29/2024   SOB (shortness of breath) 02/29/2024   Palliative care encounter 02/29/2024   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) 02/19/2024   Right lower lobe lung mass 02/03/2024   Dysphagia 01/21/2024   Facial numbness 01/21/2024   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 [place] [address] Phone: [phone] Fax: [phone]  [facility] [address] [facility] - [place], [place] - [address] AT [facility] [address] [place] [address] Phone: [phone] Fax: [phone]     Social Drivers of Health (SDOH) Social History: SDOH Screenings   Food Insecurity: No Food Insecurity (02/29/2024)  Housing: Low Risk  (02/29/2024)  Transportation Needs: Patient Declined (03/01/2024)  Utilities: Not At Risk (02/29/2024)  Depression (PHQ2-9): Low Risk  (02/19/2024)  Social Connections: Moderately Integrated (02/29/2024)  Tobacco Use: Low Risk  (02/29/2024)   SDOH Interventions:     Readmission Risk Interventions     No data to display

## 2024-03-13 DEATH — deceased

## 2024-03-17 ENCOUNTER — Institutional Professional Consult (permissible substitution) (INDEPENDENT_AMBULATORY_CARE_PROVIDER_SITE_OTHER): Admitting: Otolaryngology

## 2024-03-18 ENCOUNTER — Other Ambulatory Visit

## 2024-03-18 ENCOUNTER — Encounter: Admitting: Hospice and Palliative Medicine

## 2024-03-18 ENCOUNTER — Ambulatory Visit

## 2024-03-18 ENCOUNTER — Ambulatory Visit: Admitting: Oncology

## 2024-03-18 ENCOUNTER — Ambulatory Visit: Admitting: Internal Medicine
